# Patient Record
Sex: Female | Born: 1935
Health system: Southern US, Community
[De-identification: ages and names within clinical notes are randomized; demographics above are authoritative.]

## PROBLEM LIST (undated history)

## (undated) DIAGNOSIS — I4891 Unspecified atrial fibrillation: Secondary | ICD-10-CM

## (undated) DIAGNOSIS — Z9889 Other specified postprocedural states: Secondary | ICD-10-CM

## (undated) DIAGNOSIS — Z9189 Other specified personal risk factors, not elsewhere classified: Secondary | ICD-10-CM

## (undated) DIAGNOSIS — N289 Disorder of kidney and ureter, unspecified: Secondary | ICD-10-CM

## (undated) DIAGNOSIS — H01009 Unspecified blepharitis unspecified eye, unspecified eyelid: Secondary | ICD-10-CM

## (undated) DIAGNOSIS — R112 Nausea with vomiting, unspecified: Secondary | ICD-10-CM

## (undated) DIAGNOSIS — K5790 Diverticulosis of intestine, part unspecified, without perforation or abscess without bleeding: Secondary | ICD-10-CM

## (undated) DIAGNOSIS — D649 Anemia, unspecified: Secondary | ICD-10-CM

## (undated) DIAGNOSIS — R3129 Other microscopic hematuria: Secondary | ICD-10-CM

## (undated) DIAGNOSIS — D219 Benign neoplasm of connective and other soft tissue, unspecified: Secondary | ICD-10-CM

## (undated) DIAGNOSIS — K219 Gastro-esophageal reflux disease without esophagitis: Secondary | ICD-10-CM

## (undated) DIAGNOSIS — I2510 Atherosclerotic heart disease of native coronary artery without angina pectoris: Secondary | ICD-10-CM

## (undated) DIAGNOSIS — E7800 Pure hypercholesterolemia, unspecified: Secondary | ICD-10-CM

## (undated) DIAGNOSIS — K649 Unspecified hemorrhoids: Secondary | ICD-10-CM

## (undated) DIAGNOSIS — M8580 Other specified disorders of bone density and structure, unspecified site: Secondary | ICD-10-CM

## (undated) DIAGNOSIS — M47816 Spondylosis without myelopathy or radiculopathy, lumbar region: Secondary | ICD-10-CM

## (undated) DIAGNOSIS — M1990 Unspecified osteoarthritis, unspecified site: Secondary | ICD-10-CM

## (undated) DIAGNOSIS — N809 Endometriosis, unspecified: Secondary | ICD-10-CM

## (undated) HISTORY — DX: Other microscopic hematuria: R31.29

## (undated) HISTORY — DX: Unspecified atrial fibrillation: I48.91

## (undated) HISTORY — DX: Pure hypercholesterolemia, unspecified: E78.00

## (undated) HISTORY — PX: APPENDECTOMY: SHX54

## (undated) HISTORY — PX: CATARACT EXTRACTION W/ INTRAOCULAR LENS  IMPLANT, BILATERAL: SHX1307

## (undated) HISTORY — PX: SALPINGOOPHORECTOMY: SHX82

## (undated) HISTORY — DX: Other specified personal risk factors, not elsewhere classified: Z91.89

## (undated) HISTORY — PX: OVARIAN CYST SURGERY: SHX726

## (undated) HISTORY — DX: Endometriosis, unspecified: N80.9

## (undated) HISTORY — DX: Unspecified blepharitis unspecified eye, unspecified eyelid: H01.009

## (undated) HISTORY — DX: Unspecified hemorrhoids: K64.9

## (undated) HISTORY — DX: Benign neoplasm of connective and other soft tissue, unspecified: D21.9

## (undated) HISTORY — DX: Diverticulosis of intestine, part unspecified, without perforation or abscess without bleeding: K57.90

## (undated) HISTORY — PX: DILATION AND CURETTAGE OF UTERUS: SHX78

## (undated) HISTORY — DX: Disorder of kidney and ureter, unspecified: N28.9

## (undated) HISTORY — PX: TONSILLECTOMY: SUR1361

## (undated) HISTORY — DX: Spondylosis without myelopathy or radiculopathy, lumbar region: M47.816

## (undated) HISTORY — DX: Other specified disorders of bone density and structure, unspecified site: M85.80

## (undated) HISTORY — PX: CORONARY ANGIOPLASTY WITH STENT PLACEMENT: SHX49

---

## 1997-10-21 ENCOUNTER — Other Ambulatory Visit: Admission: RE | Admit: 1997-10-21 | Discharge: 1997-10-21 | Payer: Self-pay | Admitting: Obstetrics and Gynecology

## 1999-05-24 ENCOUNTER — Encounter: Payer: Self-pay | Admitting: Emergency Medicine

## 1999-05-24 ENCOUNTER — Inpatient Hospital Stay (HOSPITAL_COMMUNITY): Admission: EM | Admit: 1999-05-24 | Discharge: 1999-05-25 | Payer: Self-pay | Admitting: Emergency Medicine

## 1999-05-25 ENCOUNTER — Encounter: Payer: Self-pay | Admitting: Geriatric Medicine

## 2000-02-13 ENCOUNTER — Other Ambulatory Visit: Admission: RE | Admit: 2000-02-13 | Discharge: 2000-02-13 | Payer: Self-pay | Admitting: Obstetrics and Gynecology

## 2000-11-29 ENCOUNTER — Encounter: Payer: Self-pay | Admitting: Geriatric Medicine

## 2000-11-29 ENCOUNTER — Encounter: Admission: RE | Admit: 2000-11-29 | Discharge: 2000-11-29 | Payer: Self-pay | Admitting: Geriatric Medicine

## 2001-02-18 ENCOUNTER — Other Ambulatory Visit: Admission: RE | Admit: 2001-02-18 | Discharge: 2001-02-18 | Payer: Self-pay | Admitting: Obstetrics and Gynecology

## 2002-04-21 ENCOUNTER — Other Ambulatory Visit: Admission: RE | Admit: 2002-04-21 | Discharge: 2002-04-21 | Payer: Self-pay | Admitting: Obstetrics and Gynecology

## 2003-05-11 ENCOUNTER — Other Ambulatory Visit: Admission: RE | Admit: 2003-05-11 | Discharge: 2003-05-11 | Payer: Self-pay | Admitting: Obstetrics and Gynecology

## 2011-07-25 DIAGNOSIS — M7710 Lateral epicondylitis, unspecified elbow: Secondary | ICD-10-CM | POA: Diagnosis not present

## 2011-11-12 DIAGNOSIS — H0019 Chalazion unspecified eye, unspecified eyelid: Secondary | ICD-10-CM | POA: Diagnosis not present

## 2011-11-12 DIAGNOSIS — H3530 Unspecified macular degeneration: Secondary | ICD-10-CM | POA: Diagnosis not present

## 2011-11-12 DIAGNOSIS — H259 Unspecified age-related cataract: Secondary | ICD-10-CM | POA: Diagnosis not present

## 2011-11-12 DIAGNOSIS — H01009 Unspecified blepharitis unspecified eye, unspecified eyelid: Secondary | ICD-10-CM | POA: Diagnosis not present

## 2011-11-14 DIAGNOSIS — N952 Postmenopausal atrophic vaginitis: Secondary | ICD-10-CM | POA: Diagnosis not present

## 2012-06-05 DIAGNOSIS — H52209 Unspecified astigmatism, unspecified eye: Secondary | ICD-10-CM | POA: Diagnosis not present

## 2012-06-05 DIAGNOSIS — H259 Unspecified age-related cataract: Secondary | ICD-10-CM | POA: Diagnosis not present

## 2012-06-05 DIAGNOSIS — H0019 Chalazion unspecified eye, unspecified eyelid: Secondary | ICD-10-CM | POA: Diagnosis not present

## 2012-06-05 DIAGNOSIS — H01009 Unspecified blepharitis unspecified eye, unspecified eyelid: Secondary | ICD-10-CM | POA: Diagnosis not present

## 2012-06-18 DIAGNOSIS — Z23 Encounter for immunization: Secondary | ICD-10-CM | POA: Diagnosis not present

## 2012-06-18 DIAGNOSIS — H0019 Chalazion unspecified eye, unspecified eyelid: Secondary | ICD-10-CM | POA: Diagnosis not present

## 2012-09-25 DIAGNOSIS — H16209 Unspecified keratoconjunctivitis, unspecified eye: Secondary | ICD-10-CM | POA: Diagnosis not present

## 2012-11-17 DIAGNOSIS — H16109 Unspecified superficial keratitis, unspecified eye: Secondary | ICD-10-CM | POA: Diagnosis not present

## 2012-11-17 DIAGNOSIS — H04129 Dry eye syndrome of unspecified lacrimal gland: Secondary | ICD-10-CM | POA: Diagnosis not present

## 2012-11-17 DIAGNOSIS — H259 Unspecified age-related cataract: Secondary | ICD-10-CM | POA: Diagnosis not present

## 2012-11-17 DIAGNOSIS — H01009 Unspecified blepharitis unspecified eye, unspecified eyelid: Secondary | ICD-10-CM | POA: Diagnosis not present

## 2012-11-27 DIAGNOSIS — L821 Other seborrheic keratosis: Secondary | ICD-10-CM | POA: Diagnosis not present

## 2012-12-25 DIAGNOSIS — L821 Other seborrheic keratosis: Secondary | ICD-10-CM | POA: Diagnosis not present

## 2013-01-26 DIAGNOSIS — M48061 Spinal stenosis, lumbar region without neurogenic claudication: Secondary | ICD-10-CM | POA: Diagnosis not present

## 2013-01-26 DIAGNOSIS — M5137 Other intervertebral disc degeneration, lumbosacral region: Secondary | ICD-10-CM | POA: Diagnosis not present

## 2013-01-26 DIAGNOSIS — M4310 Spondylolisthesis, site unspecified: Secondary | ICD-10-CM | POA: Diagnosis not present

## 2013-01-26 DIAGNOSIS — M545 Low back pain: Secondary | ICD-10-CM | POA: Diagnosis not present

## 2013-02-19 DIAGNOSIS — L821 Other seborrheic keratosis: Secondary | ICD-10-CM | POA: Diagnosis not present

## 2013-03-17 DIAGNOSIS — Z23 Encounter for immunization: Secondary | ICD-10-CM | POA: Diagnosis not present

## 2013-03-24 DIAGNOSIS — L821 Other seborrheic keratosis: Secondary | ICD-10-CM | POA: Diagnosis not present

## 2013-04-23 DIAGNOSIS — L821 Other seborrheic keratosis: Secondary | ICD-10-CM | POA: Diagnosis not present

## 2013-05-26 DIAGNOSIS — L821 Other seborrheic keratosis: Secondary | ICD-10-CM | POA: Diagnosis not present

## 2013-06-25 DIAGNOSIS — H01009 Unspecified blepharitis unspecified eye, unspecified eyelid: Secondary | ICD-10-CM | POA: Diagnosis not present

## 2013-06-25 DIAGNOSIS — L821 Other seborrheic keratosis: Secondary | ICD-10-CM | POA: Diagnosis not present

## 2013-06-25 DIAGNOSIS — H0019 Chalazion unspecified eye, unspecified eyelid: Secondary | ICD-10-CM | POA: Diagnosis not present

## 2013-11-12 DIAGNOSIS — L821 Other seborrheic keratosis: Secondary | ICD-10-CM | POA: Diagnosis not present

## 2013-12-21 DIAGNOSIS — H01009 Unspecified blepharitis unspecified eye, unspecified eyelid: Secondary | ICD-10-CM | POA: Diagnosis not present

## 2013-12-21 DIAGNOSIS — H04129 Dry eye syndrome of unspecified lacrimal gland: Secondary | ICD-10-CM | POA: Diagnosis not present

## 2013-12-21 DIAGNOSIS — Z961 Presence of intraocular lens: Secondary | ICD-10-CM | POA: Diagnosis not present

## 2013-12-21 DIAGNOSIS — H259 Unspecified age-related cataract: Secondary | ICD-10-CM | POA: Diagnosis not present

## 2013-12-24 DIAGNOSIS — L918 Other hypertrophic disorders of the skin: Secondary | ICD-10-CM | POA: Diagnosis not present

## 2013-12-24 DIAGNOSIS — L908 Other atrophic disorders of skin: Secondary | ICD-10-CM | POA: Diagnosis not present

## 2014-01-07 DIAGNOSIS — R55 Syncope and collapse: Secondary | ICD-10-CM | POA: Diagnosis not present

## 2014-01-07 DIAGNOSIS — R110 Nausea: Secondary | ICD-10-CM | POA: Diagnosis not present

## 2014-01-18 DIAGNOSIS — Z1331 Encounter for screening for depression: Secondary | ICD-10-CM | POA: Diagnosis not present

## 2014-01-18 DIAGNOSIS — E7800 Pure hypercholesterolemia, unspecified: Secondary | ICD-10-CM | POA: Diagnosis not present

## 2014-01-18 DIAGNOSIS — R002 Palpitations: Secondary | ICD-10-CM | POA: Diagnosis not present

## 2014-01-18 DIAGNOSIS — Z0000 Encounter for general adult medical examination without abnormal findings: Secondary | ICD-10-CM | POA: Diagnosis not present

## 2014-01-18 DIAGNOSIS — F411 Generalized anxiety disorder: Secondary | ICD-10-CM | POA: Diagnosis not present

## 2014-01-18 DIAGNOSIS — Z79899 Other long term (current) drug therapy: Secondary | ICD-10-CM | POA: Diagnosis not present

## 2014-01-18 DIAGNOSIS — R42 Dizziness and giddiness: Secondary | ICD-10-CM | POA: Diagnosis not present

## 2014-01-19 ENCOUNTER — Other Ambulatory Visit: Payer: Self-pay | Admitting: Nurse Practitioner

## 2014-01-19 DIAGNOSIS — R002 Palpitations: Secondary | ICD-10-CM

## 2014-01-21 ENCOUNTER — Encounter (INDEPENDENT_AMBULATORY_CARE_PROVIDER_SITE_OTHER): Payer: Medicare Other

## 2014-01-21 ENCOUNTER — Encounter: Payer: Self-pay | Admitting: *Deleted

## 2014-01-21 DIAGNOSIS — R002 Palpitations: Secondary | ICD-10-CM

## 2014-01-21 NOTE — Progress Notes (Signed)
Patient ID: Sherry Barnett, female   DOB: October 09, 1932, 80 y.o.   MRN: [id number] E-Cardio 24 hour holter monitor applied to patient.

## 2014-01-26 DIAGNOSIS — L821 Other seborrheic keratosis: Secondary | ICD-10-CM | POA: Diagnosis not present

## 2014-02-10 ENCOUNTER — Telehealth: Payer: Self-pay

## 2014-02-10 NOTE — Telephone Encounter (Signed)
pt aware of holter results -NSR -OCC PAC and brief SV Runs -Overall Benign pt verbalized understanding.

## 2014-02-17 DIAGNOSIS — H10509 Unspecified blepharoconjunctivitis, unspecified eye: Secondary | ICD-10-CM | POA: Diagnosis not present

## 2014-03-01 DIAGNOSIS — H01009 Unspecified blepharitis unspecified eye, unspecified eyelid: Secondary | ICD-10-CM | POA: Diagnosis not present

## 2014-03-01 DIAGNOSIS — H16209 Unspecified keratoconjunctivitis, unspecified eye: Secondary | ICD-10-CM | POA: Diagnosis not present

## 2014-03-01 DIAGNOSIS — T887XXA Unspecified adverse effect of drug or medicament, initial encounter: Secondary | ICD-10-CM | POA: Diagnosis not present

## 2014-03-01 DIAGNOSIS — H259 Unspecified age-related cataract: Secondary | ICD-10-CM | POA: Diagnosis not present

## 2014-03-19 DIAGNOSIS — I788 Other diseases of capillaries: Secondary | ICD-10-CM | POA: Diagnosis not present

## 2014-03-25 DIAGNOSIS — L918 Other hypertrophic disorders of the skin: Secondary | ICD-10-CM | POA: Diagnosis not present

## 2014-03-25 DIAGNOSIS — L821 Other seborrheic keratosis: Secondary | ICD-10-CM | POA: Diagnosis not present

## 2014-04-19 DIAGNOSIS — Z79899 Other long term (current) drug therapy: Secondary | ICD-10-CM | POA: Diagnosis not present

## 2014-04-19 DIAGNOSIS — E780 Pure hypercholesterolemia: Secondary | ICD-10-CM | POA: Diagnosis not present

## 2014-06-18 DIAGNOSIS — H01004 Unspecified blepharitis left upper eyelid: Secondary | ICD-10-CM | POA: Diagnosis not present

## 2014-06-18 DIAGNOSIS — H2511 Age-related nuclear cataract, right eye: Secondary | ICD-10-CM | POA: Diagnosis not present

## 2014-06-18 DIAGNOSIS — H01001 Unspecified blepharitis right upper eyelid: Secondary | ICD-10-CM | POA: Diagnosis not present

## 2014-06-18 DIAGNOSIS — H04123 Dry eye syndrome of bilateral lacrimal glands: Secondary | ICD-10-CM | POA: Diagnosis not present

## 2014-10-01 DIAGNOSIS — H01001 Unspecified blepharitis right upper eyelid: Secondary | ICD-10-CM | POA: Diagnosis not present

## 2014-10-01 DIAGNOSIS — H0011 Chalazion right upper eyelid: Secondary | ICD-10-CM | POA: Diagnosis not present

## 2014-10-01 DIAGNOSIS — H01004 Unspecified blepharitis left upper eyelid: Secondary | ICD-10-CM | POA: Diagnosis not present

## 2014-10-26 DIAGNOSIS — L821 Other seborrheic keratosis: Secondary | ICD-10-CM | POA: Diagnosis not present

## 2014-10-26 DIAGNOSIS — D485 Neoplasm of uncertain behavior of skin: Secondary | ICD-10-CM | POA: Diagnosis not present

## 2014-10-26 DIAGNOSIS — D224 Melanocytic nevi of scalp and neck: Secondary | ICD-10-CM | POA: Diagnosis not present

## 2014-10-28 DIAGNOSIS — D2239 Melanocytic nevi of other parts of face: Secondary | ICD-10-CM | POA: Diagnosis not present

## 2014-10-28 DIAGNOSIS — D224 Melanocytic nevi of scalp and neck: Secondary | ICD-10-CM | POA: Diagnosis not present

## 2014-10-28 DIAGNOSIS — D485 Neoplasm of uncertain behavior of skin: Secondary | ICD-10-CM | POA: Diagnosis not present

## 2014-11-01 DIAGNOSIS — M5136 Other intervertebral disc degeneration, lumbar region: Secondary | ICD-10-CM | POA: Diagnosis not present

## 2014-11-04 DIAGNOSIS — L821 Other seborrheic keratosis: Secondary | ICD-10-CM | POA: Diagnosis not present

## 2014-11-18 DIAGNOSIS — L821 Other seborrheic keratosis: Secondary | ICD-10-CM | POA: Diagnosis not present

## 2014-12-21 DIAGNOSIS — L821 Other seborrheic keratosis: Secondary | ICD-10-CM | POA: Diagnosis not present

## 2014-12-27 DIAGNOSIS — H02054 Trichiasis without entropian left upper eyelid: Secondary | ICD-10-CM | POA: Diagnosis not present

## 2014-12-27 DIAGNOSIS — H01004 Unspecified blepharitis left upper eyelid: Secondary | ICD-10-CM | POA: Diagnosis not present

## 2014-12-27 DIAGNOSIS — H02051 Trichiasis without entropian right upper eyelid: Secondary | ICD-10-CM | POA: Diagnosis not present

## 2014-12-27 DIAGNOSIS — H25811 Combined forms of age-related cataract, right eye: Secondary | ICD-10-CM | POA: Diagnosis not present

## 2015-01-24 DIAGNOSIS — Z1389 Encounter for screening for other disorder: Secondary | ICD-10-CM | POA: Diagnosis not present

## 2015-01-24 DIAGNOSIS — Z79899 Other long term (current) drug therapy: Secondary | ICD-10-CM | POA: Diagnosis not present

## 2015-01-24 DIAGNOSIS — E780 Pure hypercholesterolemia: Secondary | ICD-10-CM | POA: Diagnosis not present

## 2015-01-24 DIAGNOSIS — Z0000 Encounter for general adult medical examination without abnormal findings: Secondary | ICD-10-CM | POA: Diagnosis not present

## 2015-01-25 DIAGNOSIS — L821 Other seborrheic keratosis: Secondary | ICD-10-CM | POA: Diagnosis not present

## 2015-06-20 DIAGNOSIS — Z0100 Encounter for examination of eyes and vision without abnormal findings: Secondary | ICD-10-CM | POA: Diagnosis not present

## 2015-06-20 DIAGNOSIS — H25811 Combined forms of age-related cataract, right eye: Secondary | ICD-10-CM | POA: Diagnosis not present

## 2015-06-20 DIAGNOSIS — H353111 Nonexudative age-related macular degeneration, right eye, early dry stage: Secondary | ICD-10-CM | POA: Diagnosis not present

## 2015-06-20 DIAGNOSIS — H01004 Unspecified blepharitis left upper eyelid: Secondary | ICD-10-CM | POA: Diagnosis not present

## 2015-09-30 DIAGNOSIS — I8391 Asymptomatic varicose veins of right lower extremity: Secondary | ICD-10-CM | POA: Diagnosis not present

## 2015-09-30 DIAGNOSIS — I788 Other diseases of capillaries: Secondary | ICD-10-CM | POA: Diagnosis not present

## 2015-09-30 DIAGNOSIS — D692 Other nonthrombocytopenic purpura: Secondary | ICD-10-CM | POA: Diagnosis not present

## 2015-09-30 DIAGNOSIS — I8392 Asymptomatic varicose veins of left lower extremity: Secondary | ICD-10-CM | POA: Diagnosis not present

## 2016-01-12 DIAGNOSIS — H02054 Trichiasis without entropian left upper eyelid: Secondary | ICD-10-CM | POA: Diagnosis not present

## 2016-01-12 DIAGNOSIS — H02051 Trichiasis without entropian right upper eyelid: Secondary | ICD-10-CM | POA: Diagnosis not present

## 2016-01-12 DIAGNOSIS — H25811 Combined forms of age-related cataract, right eye: Secondary | ICD-10-CM | POA: Diagnosis not present

## 2016-01-12 DIAGNOSIS — H01001 Unspecified blepharitis right upper eyelid: Secondary | ICD-10-CM | POA: Diagnosis not present

## 2016-01-30 DIAGNOSIS — Z79899 Other long term (current) drug therapy: Secondary | ICD-10-CM | POA: Diagnosis not present

## 2016-01-30 DIAGNOSIS — E7800 Pure hypercholesterolemia, unspecified: Secondary | ICD-10-CM | POA: Diagnosis not present

## 2016-01-30 DIAGNOSIS — Z1389 Encounter for screening for other disorder: Secondary | ICD-10-CM | POA: Diagnosis not present

## 2016-01-30 DIAGNOSIS — Z780 Asymptomatic menopausal state: Secondary | ICD-10-CM | POA: Diagnosis not present

## 2016-01-30 DIAGNOSIS — H6121 Impacted cerumen, right ear: Secondary | ICD-10-CM | POA: Diagnosis not present

## 2016-01-30 DIAGNOSIS — Z0000 Encounter for general adult medical examination without abnormal findings: Secondary | ICD-10-CM | POA: Diagnosis not present

## 2016-03-07 DIAGNOSIS — Z780 Asymptomatic menopausal state: Secondary | ICD-10-CM | POA: Diagnosis not present

## 2016-03-07 DIAGNOSIS — M8588 Other specified disorders of bone density and structure, other site: Secondary | ICD-10-CM | POA: Diagnosis not present

## 2016-03-11 DIAGNOSIS — M8580 Other specified disorders of bone density and structure, unspecified site: Secondary | ICD-10-CM

## 2016-03-11 HISTORY — DX: Other specified disorders of bone density and structure, unspecified site: M85.80

## 2016-04-05 DIAGNOSIS — L821 Other seborrheic keratosis: Secondary | ICD-10-CM | POA: Diagnosis not present

## 2016-04-06 DIAGNOSIS — M85861 Other specified disorders of bone density and structure, right lower leg: Secondary | ICD-10-CM | POA: Diagnosis not present

## 2016-04-19 DIAGNOSIS — L821 Other seborrheic keratosis: Secondary | ICD-10-CM | POA: Diagnosis not present

## 2016-05-10 DIAGNOSIS — L821 Other seborrheic keratosis: Secondary | ICD-10-CM | POA: Diagnosis not present

## 2016-06-26 DIAGNOSIS — J019 Acute sinusitis, unspecified: Secondary | ICD-10-CM | POA: Diagnosis not present

## 2016-06-26 DIAGNOSIS — B9789 Other viral agents as the cause of diseases classified elsewhere: Secondary | ICD-10-CM | POA: Diagnosis not present

## 2016-07-06 DIAGNOSIS — J019 Acute sinusitis, unspecified: Secondary | ICD-10-CM | POA: Diagnosis not present

## 2016-07-06 DIAGNOSIS — R05 Cough: Secondary | ICD-10-CM | POA: Diagnosis not present

## 2016-07-16 DIAGNOSIS — H25811 Combined forms of age-related cataract, right eye: Secondary | ICD-10-CM | POA: Diagnosis not present

## 2016-07-16 DIAGNOSIS — H52201 Unspecified astigmatism, right eye: Secondary | ICD-10-CM | POA: Diagnosis not present

## 2016-07-16 DIAGNOSIS — Z961 Presence of intraocular lens: Secondary | ICD-10-CM | POA: Diagnosis not present

## 2016-07-16 DIAGNOSIS — H01001 Unspecified blepharitis right upper eyelid: Secondary | ICD-10-CM | POA: Diagnosis not present

## 2016-10-22 DIAGNOSIS — H353111 Nonexudative age-related macular degeneration, right eye, early dry stage: Secondary | ICD-10-CM | POA: Diagnosis not present

## 2016-10-22 DIAGNOSIS — S0011XA Contusion of right eyelid and periocular area, initial encounter: Secondary | ICD-10-CM | POA: Diagnosis not present

## 2016-10-22 DIAGNOSIS — H25811 Combined forms of age-related cataract, right eye: Secondary | ICD-10-CM | POA: Diagnosis not present

## 2016-10-22 DIAGNOSIS — H52201 Unspecified astigmatism, right eye: Secondary | ICD-10-CM | POA: Diagnosis not present

## 2016-11-06 DIAGNOSIS — H25811 Combined forms of age-related cataract, right eye: Secondary | ICD-10-CM | POA: Diagnosis not present

## 2017-02-04 DIAGNOSIS — Z1389 Encounter for screening for other disorder: Secondary | ICD-10-CM | POA: Diagnosis not present

## 2017-02-04 DIAGNOSIS — I480 Paroxysmal atrial fibrillation: Secondary | ICD-10-CM | POA: Diagnosis not present

## 2017-02-04 DIAGNOSIS — Z79899 Other long term (current) drug therapy: Secondary | ICD-10-CM | POA: Diagnosis not present

## 2017-02-04 DIAGNOSIS — R0789 Other chest pain: Secondary | ICD-10-CM | POA: Diagnosis not present

## 2017-02-04 DIAGNOSIS — E7800 Pure hypercholesterolemia, unspecified: Secondary | ICD-10-CM | POA: Diagnosis not present

## 2017-02-04 DIAGNOSIS — Z23 Encounter for immunization: Secondary | ICD-10-CM | POA: Diagnosis not present

## 2017-02-04 DIAGNOSIS — Z0000 Encounter for general adult medical examination without abnormal findings: Secondary | ICD-10-CM | POA: Diagnosis not present

## 2017-02-04 DIAGNOSIS — M8580 Other specified disorders of bone density and structure, unspecified site: Secondary | ICD-10-CM | POA: Diagnosis not present

## 2017-02-26 ENCOUNTER — Encounter (INDEPENDENT_AMBULATORY_CARE_PROVIDER_SITE_OTHER): Payer: Self-pay

## 2017-02-26 ENCOUNTER — Encounter (HOSPITAL_COMMUNITY): Payer: Medicare Other

## 2017-02-26 ENCOUNTER — Ambulatory Visit (INDEPENDENT_AMBULATORY_CARE_PROVIDER_SITE_OTHER): Payer: Medicare Other | Admitting: Interventional Cardiology

## 2017-02-26 ENCOUNTER — Encounter: Payer: Self-pay | Admitting: Interventional Cardiology

## 2017-02-26 VITALS — BP 128/70 | HR 73 | Ht 63.0 in | Wt 140.6 lb

## 2017-02-26 DIAGNOSIS — I480 Paroxysmal atrial fibrillation: Secondary | ICD-10-CM

## 2017-02-26 DIAGNOSIS — E785 Hyperlipidemia, unspecified: Secondary | ICD-10-CM

## 2017-02-26 DIAGNOSIS — I208 Other forms of angina pectoris: Secondary | ICD-10-CM | POA: Diagnosis not present

## 2017-02-26 MED ORDER — NITROGLYCERIN 0.4 MG SL SUBL: 0.4000 mg | SUBLINGUAL_TABLET | SUBLINGUAL | 3 refills | Status: DC | PRN

## 2017-02-26 MED ORDER — ASPIRIN EC 81 MG PO TBEC: 81.0000 mg | DELAYED_RELEASE_TABLET | Freq: Every day | ORAL | 3 refills | Status: DC

## 2017-02-26 MED ORDER — METOPROLOL SUCCINATE ER 25 MG PO TB24: 25.0000 mg | ORAL_TABLET | Freq: Every day | ORAL | 3 refills | Status: DC

## 2017-02-26 NOTE — Progress Notes (Signed)
Cardiology Office Note    Date:  02/26/2017   ID:  Tavionna, Grout Jan 15, 1933, MRN [id number]  PCP:  Lajean Manes, MD  Cardiologist: Sinclair Grooms, MD   Chief Complaint  Patient presents with  . Coronary Artery Disease    New onset angina    History of Present Illness:  Milinda E Gellner is a 83 y.o. female is referred by Dr. Lajean Manes for evaluation of exertional chest and jaw discomfort. She has a background history of paroxysmal/episodic atrial fibrillation but no documenting data available. Not on anticoagulation therapy.  Over the past 6-8 most 8 weeks the patient has noticed recurring precordial chest pressure with radiation into the jaws bilaterally with moderate physical activity. She has had at least 4 instances of this complaint. The initial episode occurred while exercising on the elliptical. This is something that she does regularly. She stopped exercising and the discomfort went away. She tried this again a week or so later and the same thing happened. Subsequently she has noticed the symptoms develop with yard work on one occasion and more recently she carried a bag of trash to her trash can on the curb and had the same discomfort to recur. If she stops and rests, the discomfort improves. She has had no instances of discomfort to occur spontaneously. The discomfort resolves within 60-90 seconds. There is no associated dyspnea or diaphoresis.  Past Medical History:  Diagnosis Date  . A-fib ([place])   . Bilateral cataracts   . Blepharitis    chronic  . Cardiovascular risk factor    23%  . Degenerative joint disease (DJD) of lumbar spine   . Diverticulosis   . Endometriosis   . Fibroids   . Hematuria, microscopic F120055  . Hemorrhoids   . Hypercholesterolemia    10 years  . Osteopenia 03/2016   start alendrodate  . Renal disease    stage2/stage 3    Past Surgical History:  Procedure Laterality Date  . APPENDECTOMY    . CATARACT EXTRACTION, BILATERAL  Bilateral 2001 & 11/2016  . ovarian cysts Bilateral   . tubes removal Bilateral    endometriosis    Current Medications: Outpatient Medications Prior to Visit  Medication Sig Dispense Refill  . alendronate (FOSAMAX) 70 MG tablet Take 70 mg by mouth once a week. Take with a full glass of water on an empty stomach.    Marland Kitchen atorvastatin (LIPITOR) 10 MG tablet Take 10 mg by mouth once a week.    . Azelaic Acid (FINACEA) 15 % cream Apply topically 2 (two) times daily. After skin is thoroughly washed and patted dry, gently but thoroughly massage a thin film of azelaic acid cream into the affected area twice daily, in the morning and evening.    . cholecalciferol (VITAMIN D) 1000 units tablet Take 1,000 Units by mouth daily.    Marland Kitchen tobramycin-dexamethasone (TOBRADEX) ophthalmic ointment 1 application daily.    Marland Kitchen aspirin 325 MG EC tablet Take 325 mg by mouth daily.    . minocycline (MINOCIN,DYNACIN) 100 MG capsule Take 100 mg by mouth as needed.     No facility-administered medications prior to visit.      Allergies:   Sulfa antibiotics; Novocain [procaine]; and Penicillins   Social History   Social History  . Marital status: Single    Spouse name: N/A  . Number of children: N/A  . Years of education: N/A   Social History Main Topics  . Smoking status: Former Smoker  Years: 5.00    Types: Cigarettes    Quit date: 02/06/1969  . Smokeless tobacco: Never Used  . Alcohol use No  . Drug use: No  . Sexual activity: Not Asked     Comment: MARRIED   Other Topics Concern  . None   Social History Narrative  . None     Family History:  The patient's family history includes Alzheimer's disease in her father; Heart attack in her mother; Hypertension in her father; Other (age of onset: 71) in her sister; Stroke in her father.   ROS:   Please see the history of present illness.    She states there has been a prior diagnosis of atrial fibrillation but can't remember the details. She has a  cough, occasional palpitations, and easy bruising.  All other systems reviewed and are negative.   PHYSICAL EXAM:   VS:  BP 128/70 (BP Location: Left Arm)   Pulse 73   Ht 5\' 3"  (1.6 m)   Wt 140 lb 9.6 oz (63.8 kg)   BMI 24.91 kg/m    GEN: Well nourished, well developed, in no acute distress  HEENT: normal  Neck: no JVD, carotid bruits, or masses Cardiac: RRR; no murmurs, rubs, or gallops,no edema  Respiratory:  clear to auscultation bilaterally, normal work of breathing GI: soft, nontender, nondistended, + BS MS: no deformity or atrophy  Skin: warm and dry, no rash Neuro:  Alert and Oriented x 3, Strength and sensation are intact Psych: euthymic mood, full affect  Wt Readings from Last 3 Encounters:  02/26/17 140 lb 9.6 oz (63.8 kg)      Studies/Labs Reviewed:   EKG:  EKG  Normal sinus rhythm and normal tracing. No old tracing to compare.  Recent Labs: No results found for requested labs within last 8760 hours.   Lipid Panel No results found for: CHOL, TRIG, HDL, CHOLHDL, VLDL, LDLCALC, LDLDIRECT  Additional studies/ records that were reviewed today include:  No prior cardiac studies. According to Dr. Carlyle Lipa records, the diagnosis of atrial fibrillation has been made but I have no record of this.  CHA2DS2/VAS Stroke Risk Points  3  >= 2 Points: High Risk  1 - 1.99 Points: Medium Risk  0 Points: Low Risk    The patient's score has not changed in the past year.:  No Change         Details    Note: External data might be a factor in metrics not marked with    Points Metrics   This score determines the patient's risk of having a stroke if the  patient has atrial fibrillation.       0 Has Congestive Heart Failure:  No   0 Has Vascular Disease:  No   0 Has Hypertension:  No   2 Age:  66   0 Has Diabetes:  No   0 Had Stroke:  No Had TIA:  No Had thromboembolism:  No   1 Female:  Yes         ASSESSMENT:    1. Other forms of angina pectoris ([place])   2.  PAF (paroxysmal atrial fibrillation) ([place])   3. Hyperlipidemia LDL goal <70    CHA2DS2/VAS Stroke Risk Points = 3     3 - >= 2 Points: High Risk  1 - 1.99 Points: Medium Risk  0 - Points: Low Risk          PLAN:  In order of problems listed above:  1. This  is a new complaint. Aspirin 81 mg daily, continue statin and will probably need to intensify dose, metoprolol succinate 25 mg per day, and sublingual nitroglycerin are prescribed. She has cautioned to call if pain at rest, prolonged pain unrelieved by rest, or more easily provokable discomfort. A stress myocardial perfusion study will be done as soon as possible. If moderate to high risk will need coronary angiography. Angiography was offered at this time but she prefers a less aggressive approach. 2. I have no documentation of atrial fibrillation from prior monitoring or on EKG. She needs a 30 day continuous ambulatory monitor. At her age, if atrial fibrillation is occurring in the background, she needs full anticoagulation. 3. LDL target should be less than 70.  Stress myocardial perfusion study as soon as possible. Follow-up in 7 to it most 10 days. If moderate to high risk study and/or no improvement in symptoms on medical therapy, she will need coronary angiography. She is implored to report to the emergency room if pain at rest, pain requiring nitroglycerin to relieve, or prolonged discomfort.  Medication Adjustments/Labs and Tests Ordered: Current medicines are reviewed at length with the patient today.  Concerns regarding medicines are outlined above.  Medication changes, Labs and Tests ordered today are listed in the Patient Instructions below. Patient Instructions  Medication Instructions:  1) START Metoprolol Succinate 25mg  once daily 2) DECREASE Aspirin to 81mg  once daily 3) You may take Nitro when you are having chest pain.  The proper use and anticipated side effects of nitroglycerine has been carefully explained.  If a  single episode of chest pain is not relieved by one tablet, the patient will try another within 5 minutes; and if this doesn't relieve the pain, the patient is instructed to call 911 for transportation to an emergency department.   Labwork: None  Testing/Procedures: Your physician has requested that you have a lexiscan myoview ASAP. For further information please visit HugeFiesta.tn. Please follow instruction sheet, as given.  Your physician has recommended that you wear an event monitor. Event monitors are medical devices that record the heart's electrical activity. Doctors most often [place] these monitors to diagnose arrhythmias. Arrhythmias are problems with the speed or rhythm of the heartbeat. The monitor is a small, portable device. You can wear one while you do your normal daily activities. This is usually used to diagnose what is causing palpitations/syncope (passing out).   Follow-Up: Your physician recommends that you schedule a follow-up appointment in: 7-10 days with a PA or NP.    Any Other Special Instructions Will Be Listed Below (If Applicable).     If you need a refill on your cardiac medications before your next appointment, please call your pharmacy.      Signed, Sinclair Grooms, MD  02/26/2017 2:00 PM    [address], [place], [place]  [address] Phone: [phone]; Fax: [phone]

## 2017-02-26 NOTE — Patient Instructions (Signed)
Medication Instructions:  1) START Metoprolol Succinate 25mg  once daily 2) DECREASE Aspirin to 81mg  once daily 3) You may take Nitro when you are having chest pain.  The proper use and anticipated side effects of nitroglycerine has been carefully explained.  If a single episode of chest pain is not relieved by one tablet, the patient will try another within 5 minutes; and if this doesn't relieve the pain, the patient is instructed to call 911 for transportation to an emergency department.   Labwork: None  Testing/Procedures: Your physician has requested that you have a lexiscan myoview ASAP. For further information please visit HugeFiesta.tn. Please follow instruction sheet, as given.  Your physician has recommended that you wear an event monitor. Event monitors are medical devices that record the heart's electrical activity. Doctors most often [place] these monitors to diagnose arrhythmias. Arrhythmias are problems with the speed or rhythm of the heartbeat. The monitor is a small, portable device. You can wear one while you do your normal daily activities. This is usually used to diagnose what is causing palpitations/syncope (passing out).   Follow-Up: Your physician recommends that you schedule a follow-up appointment in: 7-10 days with a PA or NP.    Any Other Special Instructions Will Be Listed Below (If Applicable).     If you need a refill on your cardiac medications before your next appointment, please call your pharmacy.

## 2017-02-27 ENCOUNTER — Ambulatory Visit (HOSPITAL_COMMUNITY): Payer: Medicare Other | Attending: Cardiovascular Disease

## 2017-02-27 DIAGNOSIS — Z8249 Family history of ischemic heart disease and other diseases of the circulatory system: Secondary | ICD-10-CM | POA: Insufficient documentation

## 2017-02-27 DIAGNOSIS — I4891 Unspecified atrial fibrillation: Secondary | ICD-10-CM | POA: Insufficient documentation

## 2017-02-27 DIAGNOSIS — I208 Other forms of angina pectoris: Secondary | ICD-10-CM

## 2017-02-27 LAB — MYOCARDIAL PERFUSION IMAGING
CHL CUP NUCLEAR SDS: 3
CHL CUP NUCLEAR SSS: 3
LHR: 0.29
LV sys vol: 18 mL
LVDIAVOL: 57 mL (ref 46–106)
NUC STRESS TID: 0.85
Peak HR: 96 {beats}/min
Rest HR: 62 {beats}/min
SRS: 0

## 2017-02-27 MED ORDER — TECHNETIUM TC 99M TETROFOSMIN IV KIT
10.3000 | PACK | Freq: Once | INTRAVENOUS | Status: AC | PRN
  Administered 2017-02-27: 10.3 via INTRAVENOUS
  Filled 2017-02-27: qty 11

## 2017-02-27 MED ORDER — REGADENOSON 0.4 MG/5ML IV SOLN
0.4000 mg | Freq: Once | INTRAVENOUS | Status: AC
  Administered 2017-02-27: 0.4 mg via INTRAVENOUS

## 2017-02-27 MED ORDER — TECHNETIUM TC 99M TETROFOSMIN IV KIT
31.5000 | PACK | Freq: Once | INTRAVENOUS | Status: AC | PRN
  Administered 2017-02-27: 31.5 via INTRAVENOUS
  Filled 2017-02-27: qty 32

## 2017-02-28 ENCOUNTER — Telehealth: Payer: Self-pay | Admitting: Interventional Cardiology

## 2017-02-28 NOTE — Telephone Encounter (Signed)
Went over Sherry Barnett results with pt.  Pt verbalized understanding and was in agreement with this plan.

## 2017-02-28 NOTE — Telephone Encounter (Signed)
New message   Pt is returning call to [place]. Please call.

## 2017-03-08 ENCOUNTER — Ambulatory Visit (INDEPENDENT_AMBULATORY_CARE_PROVIDER_SITE_OTHER): Payer: Medicare Other

## 2017-03-08 DIAGNOSIS — I480 Paroxysmal atrial fibrillation: Secondary | ICD-10-CM

## 2017-03-11 NOTE — Progress Notes (Signed)
Cardiology Office Note    Date:  03/13/2017   ID:  Sherry Barnett [birth year], MRN [id number]  PCP:  Lajean Manes, MD  Cardiologist:  Dr. Tamala Julian   Chief Complaint: 2 weeks follow up for angina  History of Present Illness:   Sherry Barnett is a 83 y.o. female with a history of paroxysmal atrial fibrillation remotely, chronic kidney disease stage II-III, hypercholesterolemia presents for follow-up.  Seen by Dr. Tamala Julian 02/26/17 to established care for new onset angina x 8 weeks. Pending 30 day monitor for evaluation of afib. Follow-up stress test low risk however Baseline EKG showed NSR with < 0.29mm ST elevation in V2. Recommended continue medical therapy. If no improvement or worsening of of symptoms, plan for coronary angiography.  Patient is here for follow-up. He continues to have exertional chest pain with shortness of breath. Relieves with rest. He did not require any nitroglycerin. Last Saturday she was walking uphill and needed to stop multiple times. This morning she also had an episode while picking up flowers that resolved with rest. Also complains of intermittent palpitation. This morning she called device company during palpitation however no event noted per patient. She hasn't started Lipitor yet. She has limited her activity due to exertional angina.    Past Medical History:  Diagnosis Date  . A-fib ([place])   . Bilateral cataracts   . Blepharitis    chronic  . Cardiovascular risk factor    23%  . Degenerative joint disease (DJD) of lumbar spine   . Diverticulosis   . Endometriosis   . Fibroids   . Hematuria, microscopic F120055  . Hemorrhoids   . Hypercholesterolemia    10 years  . Osteopenia 03/2016   start alendrodate  . Renal disease    stage2/stage 3    Past Surgical History:  Procedure Laterality Date  . APPENDECTOMY    . CATARACT EXTRACTION, BILATERAL Bilateral 2001 & 11/2016  . ovarian cysts Bilateral   . tubes removal Bilateral    endometriosis     Current Medications: Prior to Admission medications   Medication Sig Start Date End Date Taking? Authorizing Provider  alendronate (FOSAMAX) 70 MG tablet Take 70 mg by mouth once a week. Take with a full glass of water on an empty stomach.    [provider]  aspirin EC 81 MG tablet Take 1 tablet (81 mg total) by mouth daily. 02/26/17   Belva Crome, MD  atorvastatin (LIPITOR) 10 MG tablet Take 10 mg by mouth once a week.    [provider]  Azelaic Acid (FINACEA) 15 % cream Apply topically 2 (two) times daily. After skin is thoroughly washed and patted dry, gently but thoroughly massage a thin film of azelaic acid cream into the affected area twice daily, in the morning and evening.    [provider]  cholecalciferol (VITAMIN D) 1000 units tablet Take 1,000 Units by mouth daily.    [provider]  metoprolol succinate (TOPROL XL) 25 MG 24 hr tablet Take 1 tablet (25 mg total) by mouth daily. 02/26/17   Belva Crome, MD  nitroGLYCERIN (NITROSTAT) 0.4 MG SL tablet Place 1 tablet (0.4 mg total) under the tongue every 5 (five) minutes as needed for chest pain. 02/26/17 05/27/17  Belva Crome, MD  tobramycin-dexamethasone [facility]) ophthalmic ointment 1 application daily.    [provider]    Allergies:   Sulfa antibiotics; Novocain [procaine]; and Penicillins   Social History   Social  History  . Marital status: Single    Spouse name: N/A  . Number of children: N/A  . Years of education: N/A   Social History Main Topics  . Smoking status: Former Smoker    Years: 5.00    Types: Cigarettes    Quit date: 02/06/1969  . Smokeless tobacco: Never Used  . Alcohol use No  . Drug use: No  . Sexual activity: Not Asked     Comment: MARRIED   Other Topics Concern  . None   Social History Narrative  . None     Family History:  The patient's family history includes Alzheimer's disease in her father; Heart attack in her mother;  Hypertension in her father; Other (age of onset: 75) in her sister; Stroke in her father.   ROS:   Please see the history of present illness.    ROS All other systems reviewed and are negative.   PHYSICAL EXAM:   VS:  BP 124/70   Pulse 62   Ht 5\' 3"  (1.6 m)   Wt 139 lb (63 kg)   SpO2 98%   BMI 24.62 kg/m    GEN: Well nourished, well developed, in no acute distress  HEENT: normal  Neck: no JVD, carotid bruits, or masses Cardiac: RRR; no murmurs, rubs, or gallops,no edema  Respiratory:  clear to auscultation bilaterally, normal work of breathing GI: soft, nontender, nondistended, + BS MS: no deformity or atrophy  Skin: warm and dry, no rash Neuro:  Alert and Oriented x 3, Strength and sensation are intact Psych: euthymic mood, full affect  Wt Readings from Last 3 Encounters:  03/13/17 139 lb (63 kg)  02/27/17 140 lb (63.5 kg)  02/26/17 140 lb 9.6 oz (63.8 kg)      Studies/Labs Reviewed:   EKG:  EKG is not ordered today.    Recent Labs: No results found for requested labs within last 8760 hours.   Lipid Panel No results found for: CHOL, TRIG, HDL, CHOLHDL, VLDL, LDLCALC, LDLDIRECT  Additional studies/ records that were reviewed today include:   Stress test 02/27/17  Nuclear stress EF: 68%.  Baseline EKG showed NSR with < 0.35mm ST elevation in V2  There was no ST segment deviation noted during stress from baseline EKG.  The study is normal.  This is a low risk study.  The left ventricular ejection fraction is normal (55-65%).     ASSESSMENT & PLAN:    1. Stable angina pectoris - Low risk stress test however she continues to have exertional angina. Relives with Rest. This is limiting her activity. Take PRN nitro. Reviewed with Dr. Tamala Julian. Plan for cath tomorrow.  Continue ASA, statin and BB. -The patient understands that risks include but are not limited to stroke (1 in 1000), death (1 in 82), kidney failure [usually temporary] (1 in 500), bleeding (1 in  200), allergic reaction [possibly serious] (1 in 200), and agrees to proceed.   2. Remote hx of afib - Pending 30 days monitor. Intermittent palpitations.   3. HLD - Start statin.  Medication Adjustments/Labs and Tests Ordered: Current medicines are reviewed at length with the patient today.  Concerns regarding medicines are outlined above.  Medication changes, Labs and Tests ordered today are listed in the Patient Instructions below. Patient Instructions  Medication Instructions: Your physician recommends that you continue on your current medications as directed. Please refer to the Current Medication list given to you today.  Labwork: Your physician recommends that you have lab work  today: BMET, CBC, PT/INR  Procedures/Testing: Your physician has requested that you have a cardiac catheterization. Cardiac catheterization is used to diagnose and/or treat various heart conditions. Doctors may recommend this procedure for a number of different reasons. The most common reason is to evaluate chest pain. Chest pain can be a symptom of coronary artery disease (CAD), and cardiac catheterization can show whether plaque is narrowing or blocking your heart's arteries. This procedure is also used to evaluate the valves, as well as measure the blood flow and oxygen levels in different parts of your heart. For further information please visit HugeFiesta.tn. Please follow instruction sheet, as given.  Follow-Up: Your physician recommends that you schedule a follow-up appointment in: 2 -3 WEEKS with Dr. Tamala Julian or Robbie Lis, PA-C  Any Additional Special Instructions Will Be Listed Below (If Applicable).    [place] OFFICE [address], [address] [place] [address] Dept: [phone] Loc: [phone]  CHIYOKO TORRICO  03/13/2017  You are scheduled for a Cardiac Catheterization on Thursday, October 4 with Dr.  Daneen Schick.  1. Please arrive at the [facility] (Main Entrance A) at [facility]: [address] [place], [place] [address] at 1:00 PM (two hours before your procedure to ensure your preparation). Free valet parking service is available.   Special note: Every effort is made to have your procedure done on time. Please understand that emergencies sometimes delay scheduled procedures.  2. Diet: You may have a light breakfast in the morning NOTHING to eat after 7 am.  3. Labs: You will need to have blood drawn on Wednesday, October 3 at [facility] at [facility]. [address]  Open: 7:30am - 5pm    Phone: [phone]. You do not need to be fasting.  4. You may take all your morning medications with enough water to get them down safely. On the morning of your procedure, take your Aspirin and any morning medicines NOT listed above.  You may use sips of water.  5. Plan for one night stay--bring personal belongings. 6. Bring a current list of your medications and current insurance cards. 7. You MUST have a responsible person to drive you home. 8. Someone MUST be with you the first 24 hours after you arrive home or your discharge will be delayed. 9. Please wear clothes that are easy to get on and off and wear slip-on shoes.  Thank you for allowing [place] to care for you!   -- [facility]   If you need a refill on your cardiac medications before your next appointment, please call your pharmacy.      Jarrett Soho, [place]  03/13/2017 10:53 AM    [facility] [place] [address], [place], [place]  [address] Phone: [phone]; Fax: [phone]

## 2017-03-13 ENCOUNTER — Ambulatory Visit (INDEPENDENT_AMBULATORY_CARE_PROVIDER_SITE_OTHER): Payer: Medicare Other | Admitting: Physician Assistant

## 2017-03-13 ENCOUNTER — Telehealth: Payer: Self-pay

## 2017-03-13 ENCOUNTER — Encounter: Payer: Self-pay | Admitting: Physician Assistant

## 2017-03-13 VITALS — BP 124/70 | HR 62 | Ht 63.0 in | Wt 139.0 lb

## 2017-03-13 DIAGNOSIS — I480 Paroxysmal atrial fibrillation: Secondary | ICD-10-CM

## 2017-03-13 DIAGNOSIS — E785 Hyperlipidemia, unspecified: Secondary | ICD-10-CM | POA: Diagnosis not present

## 2017-03-13 DIAGNOSIS — I208 Other forms of angina pectoris: Secondary | ICD-10-CM | POA: Diagnosis not present

## 2017-03-13 DIAGNOSIS — Z01812 Encounter for preprocedural laboratory examination: Secondary | ICD-10-CM

## 2017-03-13 DIAGNOSIS — I209 Angina pectoris, unspecified: Secondary | ICD-10-CM

## 2017-03-13 LAB — PROTIME-INR
INR: 1 (ref 0.8–1.2)
PROTHROMBIN TIME: 10.5 s (ref 9.1–12.0)

## 2017-03-13 LAB — CBC WITH DIFFERENTIAL/PLATELET
BASOS: 1 %
Basophils Absolute: 0 10*3/uL (ref 0.0–0.2)
EOS (ABSOLUTE): 0.6 10*3/uL — ABNORMAL HIGH (ref 0.0–0.4)
EOS: 7 %
HEMATOCRIT: 39.1 % (ref 34.0–46.6)
Hemoglobin: 13.3 g/dL (ref 11.1–15.9)
IMMATURE GRANS (ABS): 0 10*3/uL (ref 0.0–0.1)
IMMATURE GRANULOCYTES: 0 %
LYMPHS: 44 %
Lymphocytes Absolute: 3.4 10*3/uL — ABNORMAL HIGH (ref 0.7–3.1)
MCH: 31.3 pg (ref 26.6–33.0)
MCHC: 34 g/dL (ref 31.5–35.7)
MCV: 92 fL (ref 79–97)
Monocytes Absolute: 0.5 10*3/uL (ref 0.1–0.9)
Monocytes: 6 %
NEUTROS ABS: 3.2 10*3/uL (ref 1.4–7.0)
Neutrophils: 42 %
Platelets: 264 10*3/uL (ref 150–379)
RBC: 4.25 x10E6/uL (ref 3.77–5.28)
RDW: 13.3 % (ref 12.3–15.4)
WBC: 7.6 10*3/uL (ref 3.4–10.8)

## 2017-03-13 LAB — BASIC METABOLIC PANEL
BUN/Creatinine Ratio: 19 (ref 12–28)
BUN: 16 mg/dL (ref 8–27)
CALCIUM: 9 mg/dL (ref 8.7–10.3)
CO2: 23 mmol/L (ref 20–29)
CREATININE: 0.84 mg/dL (ref 0.57–1.00)
Chloride: 102 mmol/L (ref 96–106)
GFR, EST AFRICAN AMERICAN: 75 mL/min/{1.73_m2} (ref >59)
GFR, EST NON AFRICAN AMERICAN: 65 mL/min/{1.73_m2} (ref >59)
Glucose: 84 mg/dL (ref 65–99)
POTASSIUM: 5.3 mmol/L — AB (ref 3.5–5.2)
SODIUM: 140 mmol/L (ref 134–144)

## 2017-03-13 NOTE — Patient Instructions (Signed)
Medication Instructions: Your physician recommends that you continue on your current medications as directed. Please refer to the Current Medication list given to you today.  Labwork: Your physician recommends that you have lab work today: BMET, CBC, PT/INR  Procedures/Testing: Your physician has requested that you have a cardiac catheterization. Cardiac catheterization is used to diagnose and/or treat various heart conditions. Doctors may recommend this procedure for a number of different reasons. The most common reason is to evaluate chest pain. Chest pain can be a symptom of coronary artery disease (CAD), and cardiac catheterization can show whether plaque is narrowing or blocking your heart's arteries. This procedure is also used to evaluate the valves, as well as measure the blood flow and oxygen levels in different parts of your heart. For further information please visit HugeFiesta.[email or web address]. Please follow instruction sheet, as given.  Follow-Up: Your physician recommends that you schedule a follow-up appointment in: 2 -3 WEEKS with Dr. Tamala Julian or Robbie Lis, PA-C  Any Additional Special Instructions Will Be Listed Below (If Applicable).    [place] OFFICE [address] [place] [address] Dept: [phone] Loc: [phone]  Sherry Barnett  03/13/2017  You are scheduled for a Cardiac Catheterization on Thursday, October 4 with Dr. Daneen Schick.  1. Please arrive at the [facility] (Main Entrance A) at [facility]: [address] [place], [place] [address] at 1:00 PM (two hours before your procedure to ensure your preparation). Free valet parking service is available.   Special note: Every effort is made to have your procedure done on time. Please understand that emergencies sometimes delay scheduled procedures.  2. Diet: You may have a light breakfast in the morning NOTHING to eat  after 7 am.  3. Labs: You will need to have blood drawn on Wednesday, October 3 at [facility]. [address]  Open: 7:30am - 5pm    Phone: [phone]. You do not need to be fasting.  4. You may take all your morning medications with enough water to get them down safely. On the morning of your procedure, take your Aspirin and any morning medicines NOT listed above.  You may use sips of water.  5. Plan for one night stay--bring personal belongings. 6. Bring a current list of your medications and current insurance cards. 7. You MUST have a responsible person to drive you home. 8. Someone MUST be with you the first 24 hours after you arrive home or your discharge will be delayed. 9. Please wear clothes that are easy to get on and off and wear slip-on shoes.  Thank you for allowing [place] to care for you!   -- [facility]   If you need a refill on your cardiac medications before your next appointment, please call your pharmacy.

## 2017-03-13 NOTE — Telephone Encounter (Signed)
Patient contacted pre-catheterization at [facility] scheduled for:  03/14/2017 @ 1500 Verified arrival time and place:  NT @ 1300 Confirmed AM meds to be taken pre-cath with sip of water: Take ASA Confirmed patient has responsible person to drive home post procedure and observe patient for 24 hours:  yes Addl concerns:   May have light breakfast-nothing after 7

## 2017-03-14 ENCOUNTER — Encounter (HOSPITAL_COMMUNITY)
Admission: RE | Disposition: A | Discharge status: Home / Self Care | Payer: Self-pay | Source: Ambulatory Visit | Attending: Interventional Cardiology

## 2017-03-14 ENCOUNTER — Ambulatory Visit (HOSPITAL_COMMUNITY)
Admission: RE | Admit: 2017-03-14 | Discharge: 2017-03-15 | Disposition: A | Discharge status: Home / Self Care | Payer: Medicare Other | Source: Ambulatory Visit | Attending: Interventional Cardiology | Admitting: Interventional Cardiology

## 2017-03-14 DIAGNOSIS — I209 Angina pectoris, unspecified: Secondary | ICD-10-CM | POA: Diagnosis not present

## 2017-03-14 DIAGNOSIS — Z882 Allergy status to sulfonamides status: Secondary | ICD-10-CM | POA: Insufficient documentation

## 2017-03-14 DIAGNOSIS — N183 Chronic kidney disease, stage 3 (moderate): Secondary | ICD-10-CM | POA: Insufficient documentation

## 2017-03-14 DIAGNOSIS — Z880 Allergy status to penicillin: Secondary | ICD-10-CM | POA: Insufficient documentation

## 2017-03-14 DIAGNOSIS — Z7982 Long term (current) use of aspirin: Secondary | ICD-10-CM | POA: Diagnosis not present

## 2017-03-14 DIAGNOSIS — E7800 Pure hypercholesterolemia, unspecified: Secondary | ICD-10-CM | POA: Diagnosis not present

## 2017-03-14 DIAGNOSIS — E785 Hyperlipidemia, unspecified: Secondary | ICD-10-CM

## 2017-03-14 DIAGNOSIS — Z8679 Personal history of other diseases of the circulatory system: Secondary | ICD-10-CM

## 2017-03-14 DIAGNOSIS — I25118 Atherosclerotic heart disease of native coronary artery with other forms of angina pectoris: Secondary | ICD-10-CM | POA: Insufficient documentation

## 2017-03-14 DIAGNOSIS — Z87891 Personal history of nicotine dependence: Secondary | ICD-10-CM | POA: Diagnosis not present

## 2017-03-14 DIAGNOSIS — Z79899 Other long term (current) drug therapy: Secondary | ICD-10-CM | POA: Diagnosis not present

## 2017-03-14 DIAGNOSIS — N182 Chronic kidney disease, stage 2 (mild): Secondary | ICD-10-CM | POA: Diagnosis not present

## 2017-03-14 DIAGNOSIS — Z955 Presence of coronary angioplasty implant and graft: Secondary | ICD-10-CM

## 2017-03-14 DIAGNOSIS — I480 Paroxysmal atrial fibrillation: Secondary | ICD-10-CM | POA: Diagnosis not present

## 2017-03-14 HISTORY — PX: CORONARY STENT INTERVENTION: CATH118234

## 2017-03-14 HISTORY — PX: LEFT HEART CATH AND CORONARY ANGIOGRAPHY: CATH118249

## 2017-03-14 HISTORY — DX: Atherosclerotic heart disease of native coronary artery without angina pectoris: I25.10

## 2017-03-14 LAB — CBC
HEMATOCRIT: 37.8 % (ref 36.0–46.0)
Hemoglobin: 12.4 g/dL (ref 12.0–15.0)
MCH: 30.7 pg (ref 26.0–34.0)
MCHC: 32.8 g/dL (ref 30.0–36.0)
MCV: 93.6 fL (ref 78.0–100.0)
Platelets: 204 10*3/uL (ref 150–400)
RBC: 4.04 MIL/uL (ref 3.87–5.11)
RDW: 13.8 % (ref 11.5–15.5)
WBC: 7.1 10*3/uL (ref 4.0–10.5)

## 2017-03-14 LAB — CREATININE, SERUM
CREATININE: 0.87 mg/dL (ref 0.44–1.00)
GFR calc Af Amer: >60 mL/min (ref >60)

## 2017-03-14 LAB — POCT ACTIVATED CLOTTING TIME: Activated Clotting Time: 555 seconds

## 2017-03-14 SURGERY — LEFT HEART CATH AND CORONARY ANGIOGRAPHY
Anesthesia: LOCAL

## 2017-03-14 MED ORDER — SODIUM CHLORIDE 0.9 % IV SOLN
INTRAVENOUS | Status: AC | PRN
  Administered 2017-03-14: 1.75 mg/kg/h via INTRAVENOUS

## 2017-03-14 MED ORDER — ATORVASTATIN CALCIUM 80 MG PO TABS: 80.0000 mg | ORAL_TABLET | Freq: Every day | ORAL | Status: DC

## 2017-03-14 MED ORDER — ASPIRIN EC 81 MG PO TBEC
81.0000 mg | DELAYED_RELEASE_TABLET | Freq: Every day | ORAL | Status: DC
  Administered 2017-03-15: 81 mg via ORAL
  Filled 2017-03-14: qty 1

## 2017-03-14 MED ORDER — HEPARIN SODIUM (PORCINE) 1000 UNIT/ML IJ SOLN
INTRAMUSCULAR | Status: DC | PRN
Start: 2017-03-14 — End: 2017-03-14
  Administered 2017-03-14: 3500 [IU] via INTRAVENOUS

## 2017-03-14 MED ORDER — SODIUM CHLORIDE 0.9 % IV SOLN: 250.0000 mL | INTRAVENOUS | Status: DC | PRN

## 2017-03-14 MED ORDER — METOPROLOL SUCCINATE ER 25 MG PO TB24
25.0000 mg | ORAL_TABLET | Freq: Every day | ORAL | Status: DC
  Administered 2017-03-15: 25 mg via ORAL
  Filled 2017-03-14: qty 1

## 2017-03-14 MED ORDER — HYDRALAZINE HCL 20 MG/ML IJ SOLN: 5.0000 mg | INTRAMUSCULAR | Status: AC | PRN

## 2017-03-14 MED ORDER — NITROGLYCERIN 1 MG/10 ML FOR IR/CATH LAB
INTRA_ARTERIAL | Status: DC | PRN
  Administered 2017-03-14: 200 ug via INTRACORONARY

## 2017-03-14 MED ORDER — MIDAZOLAM HCL 2 MG/2ML IJ SOLN
INTRAMUSCULAR | Status: DC | PRN
  Administered 2017-03-14: 1 mg via INTRAVENOUS

## 2017-03-14 MED ORDER — ONDANSETRON HCL 4 MG/2ML IJ SOLN: 4.0000 mg | Freq: Four times a day (QID) | INTRAMUSCULAR | Status: DC | PRN

## 2017-03-14 MED ORDER — SODIUM CHLORIDE 0.9 % WEIGHT BASED INFUSION: 1.0000 mL/kg/h | INTRAVENOUS | Status: AC

## 2017-03-14 MED ORDER — TICAGRELOR 90 MG PO TABS
ORAL_TABLET | ORAL | Status: AC
  Filled 2017-03-14: qty 2

## 2017-03-14 MED ORDER — ACETAMINOPHEN 325 MG PO TABS: 650.0000 mg | ORAL_TABLET | ORAL | Status: DC | PRN

## 2017-03-14 MED ORDER — HEPARIN SODIUM (PORCINE) 5000 UNIT/ML IJ SOLN
5000.0000 [IU] | Freq: Three times a day (TID) | INTRAMUSCULAR | Status: DC
  Administered 2017-03-15: 5000 [IU] via SUBCUTANEOUS
  Filled 2017-03-14: qty 1

## 2017-03-14 MED ORDER — TICAGRELOR 90 MG PO TABS
90.0000 mg | ORAL_TABLET | Freq: Two times a day (BID) | ORAL | Status: DC
  Administered 2017-03-15 (×2): 90 mg via ORAL
  Filled 2017-03-14 (×2): qty 1

## 2017-03-14 MED ORDER — FENTANYL CITRATE (PF) 100 MCG/2ML IJ SOLN
INTRAMUSCULAR | Status: AC
  Filled 2017-03-14: qty 2

## 2017-03-14 MED ORDER — SODIUM CHLORIDE 0.9% FLUSH: 3.0000 mL | Freq: Two times a day (BID) | INTRAVENOUS | Status: DC

## 2017-03-14 MED ORDER — LIDOCAINE HCL (PF) 1 % IJ SOLN
INTRAMUSCULAR | Status: DC | PRN
  Administered 2017-03-14: 2 mL

## 2017-03-14 MED ORDER — SODIUM CHLORIDE 0.9% FLUSH: 3.0000 mL | INTRAVENOUS | Status: DC | PRN

## 2017-03-14 MED ORDER — FENTANYL CITRATE (PF) 100 MCG/2ML IJ SOLN
INTRAMUSCULAR | Status: DC | PRN
  Administered 2017-03-14: 50 ug via INTRAVENOUS

## 2017-03-14 MED ORDER — SODIUM CHLORIDE 0.9 % WEIGHT BASED INFUSION: 1.0000 mL/kg/h | INTRAVENOUS | Status: DC

## 2017-03-14 MED ORDER — HEPARIN SODIUM (PORCINE) 1000 UNIT/ML IJ SOLN
INTRAMUSCULAR | Status: AC
  Filled 2017-03-14: qty 1

## 2017-03-14 MED ORDER — BIVALIRUDIN BOLUS VIA INFUSION - CUPID
INTRAVENOUS | Status: DC | PRN
  Administered 2017-03-14: 46.575 mg via INTRAVENOUS

## 2017-03-14 MED ORDER — VERAPAMIL HCL 2.5 MG/ML IV SOLN
INTRAVENOUS | Status: AC
  Filled 2017-03-14: qty 2

## 2017-03-14 MED ORDER — IOPAMIDOL (ISOVUE-370) INJECTION 76%
INTRAVENOUS | Status: AC
  Filled 2017-03-14: qty 100

## 2017-03-14 MED ORDER — VERAPAMIL HCL 2.5 MG/ML IV SOLN
INTRAVENOUS | Status: DC | PRN
  Administered 2017-03-14: 10 mL via INTRA_ARTERIAL

## 2017-03-14 MED ORDER — IOPAMIDOL (ISOVUE-370) INJECTION 76%
INTRAVENOUS | Status: DC | PRN
  Administered 2017-03-14: 155 mL via INTRA_ARTERIAL

## 2017-03-14 MED ORDER — ASPIRIN 81 MG PO CHEW: 81.0000 mg | CHEWABLE_TABLET | Freq: Every day | ORAL | Status: DC

## 2017-03-14 MED ORDER — SODIUM CHLORIDE 0.9% FLUSH
3.0000 mL | Freq: Two times a day (BID) | INTRAVENOUS | Status: DC
  Administered 2017-03-15: 3 mL via INTRAVENOUS

## 2017-03-14 MED ORDER — VITAMIN D 1000 UNITS PO TABS
1000.0000 [IU] | ORAL_TABLET | Freq: Every day | ORAL | Status: DC
  Administered 2017-03-15: 1000 [IU] via ORAL
  Filled 2017-03-14 (×2): qty 1

## 2017-03-14 MED ORDER — OXYCODONE HCL 5 MG PO TABS: 5.0000 mg | ORAL_TABLET | ORAL | Status: DC | PRN

## 2017-03-14 MED ORDER — ANGIOPLASTY BOOK
Freq: Once | Status: AC
  Administered 2017-03-14: 23:00:00
  Filled 2017-03-14: qty 1

## 2017-03-14 MED ORDER — NITROGLYCERIN 0.4 MG SL SUBL: 0.4000 mg | SUBLINGUAL_TABLET | SUBLINGUAL | Status: DC | PRN

## 2017-03-14 MED ORDER — NITROGLYCERIN 1 MG/10 ML FOR IR/CATH LAB
INTRA_ARTERIAL | Status: AC
  Filled 2017-03-14: qty 10

## 2017-03-14 MED ORDER — LABETALOL HCL 5 MG/ML IV SOLN: 10.0000 mg | INTRAVENOUS | Status: AC | PRN

## 2017-03-14 MED ORDER — HEPARIN (PORCINE) IN NACL 2-0.9 UNIT/ML-% IJ SOLN
INTRAMUSCULAR | Status: AC | PRN
  Administered 2017-03-14: 1000 mL

## 2017-03-14 MED ORDER — BIVALIRUDIN TRIFLUOROACETATE 250 MG IV SOLR
INTRAVENOUS | Status: AC
  Filled 2017-03-14: qty 250

## 2017-03-14 MED ORDER — ASPIRIN 81 MG PO CHEW: 81.0000 mg | CHEWABLE_TABLET | ORAL | Status: DC

## 2017-03-14 MED ORDER — MIDAZOLAM HCL 2 MG/2ML IJ SOLN
INTRAMUSCULAR | Status: AC
  Filled 2017-03-14: qty 2

## 2017-03-14 MED ORDER — HEPARIN (PORCINE) IN NACL 2-0.9 UNIT/ML-% IJ SOLN
INTRAMUSCULAR | Status: AC
  Filled 2017-03-14: qty 1000

## 2017-03-14 MED ORDER — AZELAIC ACID 15 % EX GEL: 1.0000 "application " | Freq: Every day | CUTANEOUS | Status: DC

## 2017-03-14 MED ORDER — TICAGRELOR 90 MG PO TABS
ORAL_TABLET | ORAL | Status: DC | PRN
  Administered 2017-03-14: 180 mg via ORAL

## 2017-03-14 MED ORDER — SODIUM CHLORIDE 0.9 % WEIGHT BASED INFUSION
3.0000 mL/kg/h | INTRAVENOUS | Status: DC
  Administered 2017-03-14: 3 mL/kg/h via INTRAVENOUS

## 2017-03-14 SURGICAL SUPPLY — 19 items
BALLN EMERGE MR 2.5X12 (BALLOONS) ×2
BALLN SAPPHIRE ~~LOC~~ 3.0X12 (BALLOONS) ×1 IMPLANT
BALLOON EMERGE MR 2.5X12 (BALLOONS) IMPLANT
CATH 5FR JL3.5 JR4 ANG PIG MP (CATHETERS) ×1 IMPLANT
CATH VISTA GUIDE 6FR XBLAD3.0 (CATHETERS) ×1 IMPLANT
COVER PRB 48X5XTLSCP FOLD TPE (BAG) IMPLANT
COVER PROBE 5X48 (BAG) ×2
DEVICE RAD COMP TR BAND LRG (VASCULAR PRODUCTS) ×1 IMPLANT
GLIDESHEATH SLEND A-KIT 6F 22G (SHEATH) ×1 IMPLANT
GUIDEWIRE INQWIRE 1.5J.035X260 (WIRE) IMPLANT
INQWIRE 1.5J .035X260CM (WIRE) ×2
KIT ENCORE 26 ADVANTAGE (KITS) ×1 IMPLANT
KIT HEART LEFT (KITS) ×2 IMPLANT
PACK CARDIAC CATHETERIZATION (CUSTOM PROCEDURE TRAY) ×2 IMPLANT
STENT SIERRA 2.50 X 23 MM (Permanent Stent) ×1 IMPLANT
TRANSDUCER W/STOPCOCK (MISCELLANEOUS) ×2 IMPLANT
TUBING CIL FLEX 10 FLL-RA (TUBING) ×2 IMPLANT
WIRE ASAHI PROWATER 180CM (WIRE) ×2 IMPLANT
WIRE HI TORQ VERSACORE-J 145CM (WIRE) ×1 IMPLANT

## 2017-03-14 NOTE — H&P (View-Only) (Signed)
Cardiology Office Note    Date:  03/13/2017   ID:  Sherry, Barnett December 09, 1932, MRN [id number]  PCP:  Sherry Manes, MD  Cardiologist:  Dr. Tamala Barnett   Chief Complaint: 2 weeks follow up for angina  History of Present Illness:   Sherry Barnett is a 83 y.o. female with a history of paroxysmal atrial fibrillation remotely, chronic kidney disease stage II-III, hypercholesterolemia presents for follow-up.  Seen by Dr. Tamala Barnett 02/26/17 to established care for new onset angina x 8 weeks. Pending 30 day monitor for evaluation of afib. Follow-up stress test low risk however Baseline EKG showed NSR with < 0.58mm ST elevation in V2. Recommended continue medical therapy. If no improvement or worsening of of symptoms, plan for coronary angiography.  Patient is here for follow-up. He continues to have exertional chest pain with shortness of breath. Relieves with rest. He did not require any nitroglycerin. Last Saturday she was walking uphill and needed to stop multiple times. This morning she also had an episode while picking up flowers that resolved with rest. Also complains of intermittent palpitation. This morning she called device company during palpitation however no event noted per patient. She hasn't started Lipitor yet. She has limited her activity due to exertional angina.    Past Medical History:  Diagnosis Date  . A-fib ([place])   . Bilateral cataracts   . Blepharitis    chronic  . Cardiovascular risk factor    23%  . Degenerative joint disease (DJD) of lumbar spine   . Diverticulosis   . Endometriosis   . Fibroids   . Hematuria, microscopic [id number]  . Hemorrhoids   . Hypercholesterolemia    10 years  . Osteopenia 03/2016   start alendrodate  . Renal disease    stage2/stage 3    Past Surgical History:  Procedure Laterality Date  . APPENDECTOMY    . CATARACT EXTRACTION, BILATERAL Bilateral 2001 & 11/2016  . ovarian cysts Bilateral   . tubes removal Bilateral    endometriosis     Current Medications: Prior to Admission medications   Medication Sig Start Date End Date Taking? Authorizing Provider  alendronate (FOSAMAX) 70 MG tablet Take 70 mg by mouth once a week. Take with a full glass of water on an empty stomach.    [provider]  aspirin EC 81 MG tablet Take 1 tablet (81 mg total) by mouth daily. 02/26/17   Sherry Crome, MD  atorvastatin (LIPITOR) 10 MG tablet Take 10 mg by mouth once a week.    [provider]  Azelaic Acid (FINACEA) 15 % cream Apply topically 2 (two) times daily. After skin is thoroughly washed and patted dry, gently but thoroughly massage a thin film of azelaic acid cream into the affected area twice daily, in the morning and evening.    [provider]  cholecalciferol (VITAMIN D) 1000 units tablet Take 1,000 Units by mouth daily.    [provider]  metoprolol succinate (TOPROL XL) 25 MG 24 hr tablet Take 1 tablet (25 mg total) by mouth daily. 02/26/17   Sherry Crome, MD  nitroGLYCERIN (NITROSTAT) 0.4 MG SL tablet Place 1 tablet (0.4 mg total) under the tongue every 5 (five) minutes as needed for chest pain. 02/26/17 05/27/17  Sherry Crome, MD  tobramycin-dexamethasone [facility]) ophthalmic ointment 1 application daily.    [provider]    Allergies:   Sulfa antibiotics; Novocain [procaine]; and Penicillins   Social History   Social  History  . Marital status: Single    Spouse name: N/A  . Number of children: N/A  . Years of education: N/A   Social History Main Topics  . Smoking status: Former Smoker    Years: 5.00    Types: Cigarettes    Quit date: 02/06/1969  . Smokeless tobacco: Never Used  . Alcohol use No  . Drug use: No  . Sexual activity: Not Asked     Comment: MARRIED   Other Topics Concern  . None   Social History Narrative  . None     Family History:  The patient's family history includes Alzheimer's disease in her father; Heart attack in her mother;  Hypertension in her father; Other (age of onset: 74) in her sister; Stroke in her father.   ROS:   Please see the history of present illness.    ROS All other systems reviewed and are negative.   PHYSICAL EXAM:   VS:  BP 124/70   Pulse 62   Ht 5\' 3"  (1.6 m)   Wt 139 lb (63 kg)   SpO2 98%   BMI 24.62 kg/m    GEN: Well nourished, well developed, in no acute distress  HEENT: normal  Neck: no JVD, carotid bruits, or masses Cardiac: RRR; no murmurs, rubs, or gallops,no edema  Respiratory:  clear to auscultation bilaterally, normal work of breathing GI: soft, nontender, nondistended, + BS MS: no deformity or atrophy  Skin: warm and dry, no rash Neuro:  Alert and Oriented x 3, Strength and sensation are intact Psych: euthymic mood, full affect  Wt Readings from Last 3 Encounters:  03/13/17 139 lb (63 kg)  02/27/17 140 lb (63.5 kg)  02/26/17 140 lb 9.6 oz (63.8 kg)      Studies/Labs Reviewed:   EKG:  EKG is not ordered today.    Recent Labs: No results found for requested labs within last 8760 hours.   Lipid Panel No results found for: CHOL, TRIG, HDL, CHOLHDL, VLDL, LDLCALC, LDLDIRECT  Additional studies/ records that were reviewed today include:   Stress test 02/27/17  Nuclear stress EF: 68%.  Baseline EKG showed NSR with < 0.16mm ST elevation in V2  There was no ST segment deviation noted during stress from baseline EKG.  The study is normal.  This is a low risk study.  The left ventricular ejection fraction is normal (55-65%).     ASSESSMENT & PLAN:    1. Stable angina pectoris - Low risk stress test however she continues to have exertional angina. Relives with Rest. This is limiting her activity. Take PRN nitro. Reviewed with Dr. Tamala Barnett. Plan for cath tomorrow.  Continue ASA, statin and BB. -The patient understands that risks include but are not limited to stroke (1 in 1000), death (1 in 75), kidney failure [usually temporary] (1 in 500), bleeding (1 in  200), allergic reaction [possibly serious] (1 in 200), and agrees to proceed.   2. Remote hx of afib - Pending 30 days monitor. Intermittent palpitations.   3. HLD - Start statin.  Medication Adjustments/Labs and Tests Ordered: Current medicines are reviewed at length with the patient today.  Concerns regarding medicines are outlined above.  Medication changes, Labs and Tests ordered today are listed in the Patient Instructions below. Patient Instructions  Medication Instructions: Your physician recommends that you continue on your current medications as directed. Please refer to the Current Medication list given to you today.  Labwork: Your physician recommends that you have lab work  today: BMET, CBC, PT/INR  Procedures/Testing: Your physician has requested that you have a cardiac catheterization. Cardiac catheterization is used to diagnose and/or treat various heart conditions. Doctors may recommend this procedure for a number of different reasons. The most common reason is to evaluate chest pain. Chest pain can be a symptom of coronary artery disease (CAD), and cardiac catheterization can show whether plaque is narrowing or blocking your heart's arteries. This procedure is also used to evaluate the valves, as well as measure the blood flow and oxygen levels in different parts of your heart. For further information please visit [email or web address]. Please follow instruction sheet, as given.  Follow-Up: Your physician recommends that you schedule a follow-up appointment in: 2 -3 WEEKS with Dr. Tamala Barnett or Robbie Lis, PA-C  Any Additional Special Instructions Will Be Listed Below (If Applicable).    [facility] OFFICE [address] [place] [address] Dept: [phone] Loc: [phone]  MYLANI GENTRY  03/13/2017  You are scheduled for a Cardiac Catheterization on Thursday, October 4 with Dr.  Daneen Schick.  1. Please arrive at the [facility] (Main Entrance A) at [facility]: [address] [place], [place] [address] at 1:00 PM (two hours before your procedure to ensure your preparation). Free valet parking service is available.   Special note: Every effort is made to have your procedure done on time. Please understand that emergencies sometimes delay scheduled procedures.  2. Diet: You may have a light breakfast in the morning NOTHING to eat after 7 am.  3. Labs: You will need to have blood drawn on Wednesday, October 3 at [facility] at [facility]. [address]  Open: 7:30am - 5pm    Phone: [phone]. You do not need to be fasting.  4. You may take all your morning medications with enough water to get them down safely. On the morning of your procedure, take your Aspirin and any morning medicines NOT listed above.  You may use sips of water.  5. Plan for one night stay--bring personal belongings. 6. Bring a current list of your medications and current insurance cards. 7. You MUST have a responsible person to drive you home. 8. Someone MUST be with you the first 24 hours after you arrive home or your discharge will be delayed. 9. Please wear clothes that are easy to get on and off and wear slip-on shoes.  Thank you for allowing [place] to care for you!   -- [facility]   If you need a refill on your cardiac medications before your next appointment, please call your pharmacy.      Jarrett Soho, [place]  03/13/2017 10:53 AM    [facility] [place], [place], [place]  [address] Phone: [phone]; Fax: [phone]

## 2017-03-14 NOTE — Interval H&P Note (Signed)
Cath Lab Visit (complete for each Cath Lab visit)  Clinical Evaluation Leading to the Procedure:   ACS: No.  Non-ACS:    Anginal Classification: CCS Barnett  Anti-ischemic medical therapy: Minimal Therapy (1 class of medications)  Non-Invasive Test Results: Low-risk stress test findings: cardiac mortality <1%/year  Prior CABG: No previous CABG      History and Physical Interval Note:  03/14/2017 3:34 PM  Sherry Barnett  has presented today for surgery, with the diagnosis of unstable angina  The various methods of treatment have been discussed with the patient and family. After consideration of risks, benefits and other options for treatment, the patient has consented to  Procedure(s): LEFT HEART CATH AND CORONARY ANGIOGRAPHY (N/A) as a surgical intervention .  The patient's history has been reviewed, patient examined, no change in status, stable for surgery.  I have reviewed the patient's chart and labs.  Questions were answered to the patient's satisfaction.     Sherry Barnett

## 2017-03-15 ENCOUNTER — Encounter (HOSPITAL_COMMUNITY): Payer: Self-pay | Admitting: Interventional Cardiology

## 2017-03-15 DIAGNOSIS — Z8679 Personal history of other diseases of the circulatory system: Secondary | ICD-10-CM | POA: Diagnosis not present

## 2017-03-15 DIAGNOSIS — N182 Chronic kidney disease, stage 2 (mild): Secondary | ICD-10-CM | POA: Diagnosis not present

## 2017-03-15 DIAGNOSIS — N183 Chronic kidney disease, stage 3 (moderate): Secondary | ICD-10-CM | POA: Diagnosis not present

## 2017-03-15 DIAGNOSIS — Z79899 Other long term (current) drug therapy: Secondary | ICD-10-CM | POA: Diagnosis not present

## 2017-03-15 DIAGNOSIS — E785 Hyperlipidemia, unspecified: Secondary | ICD-10-CM | POA: Diagnosis not present

## 2017-03-15 DIAGNOSIS — I25118 Atherosclerotic heart disease of native coronary artery with other forms of angina pectoris: Secondary | ICD-10-CM | POA: Diagnosis not present

## 2017-03-15 DIAGNOSIS — E7800 Pure hypercholesterolemia, unspecified: Secondary | ICD-10-CM | POA: Diagnosis not present

## 2017-03-15 DIAGNOSIS — Z7982 Long term (current) use of aspirin: Secondary | ICD-10-CM | POA: Diagnosis not present

## 2017-03-15 DIAGNOSIS — I480 Paroxysmal atrial fibrillation: Secondary | ICD-10-CM

## 2017-03-15 DIAGNOSIS — Z87891 Personal history of nicotine dependence: Secondary | ICD-10-CM | POA: Diagnosis not present

## 2017-03-15 DIAGNOSIS — Z880 Allergy status to penicillin: Secondary | ICD-10-CM | POA: Diagnosis not present

## 2017-03-15 DIAGNOSIS — Z882 Allergy status to sulfonamides status: Secondary | ICD-10-CM | POA: Diagnosis not present

## 2017-03-15 DIAGNOSIS — I209 Angina pectoris, unspecified: Secondary | ICD-10-CM | POA: Diagnosis not present

## 2017-03-15 LAB — BASIC METABOLIC PANEL
Anion gap: 9 (ref 5–15)
BUN: 17 mg/dL (ref 6–20)
CO2: 23 mmol/L (ref 22–32)
Calcium: 8.3 mg/dL — ABNORMAL LOW (ref 8.9–10.3)
Chloride: 104 mmol/L (ref 101–111)
Creatinine, Ser: 0.97 mg/dL (ref 0.44–1.00)
GFR calc Af Amer: >60 mL/min (ref >60)
GFR calc non Af Amer: 53 mL/min — ABNORMAL LOW (ref >60)
Glucose, Bld: 84 mg/dL (ref 65–99)
Potassium: 3.8 mmol/L (ref 3.5–5.1)
Sodium: 136 mmol/L (ref 135–145)

## 2017-03-15 LAB — CBC
HCT: 35.6 % — ABNORMAL LOW (ref 36.0–46.0)
Hemoglobin: 11.7 g/dL — ABNORMAL LOW (ref 12.0–15.0)
MCH: 30.7 pg (ref 26.0–34.0)
MCHC: 32.9 g/dL (ref 30.0–36.0)
MCV: 93.4 fL (ref 78.0–100.0)
PLATELETS: 201 10*3/uL (ref 150–400)
RBC: 3.81 MIL/uL — AB (ref 3.87–5.11)
RDW: 13.6 % (ref 11.5–15.5)
WBC: 8.3 10*3/uL (ref 4.0–10.5)

## 2017-03-15 MED ORDER — ATORVASTATIN CALCIUM 80 MG PO TABS: 80.0000 mg | ORAL_TABLET | Freq: Every day | ORAL | 2 refills | Status: DC

## 2017-03-15 MED ORDER — TICAGRELOR 90 MG PO TABS: 90.0000 mg | ORAL_TABLET | Freq: Two times a day (BID) | ORAL | 1 refills | Status: DC

## 2017-03-15 NOTE — Care Management Note (Addendum)
Case Management Note  Patient Details  Name: Sherry Barnett MRN: [id number] Date of Birth: 1932-09-01  Subjective/Objective:   From home with spouse, pta indep, s/p  Coronary stent intervention will be on brilinta, NCM awaiting benefit check. NCM gave patient the 30 day free coupon card, Spouse states he will be going to [facility] on [address], they do have the brilinta in stock and they do take the coupons.  She has a PCP and medication coverge.   NCM gave patient co pay amt, which is too high, NCM notified [place] PA , she states on follow up they will switch to plavix.             Action/Plan: NCM will follow for dc needs.   Expected Discharge Date:                  Expected Discharge Plan:  Home/Self Care  In-House Referral:     Discharge planning Services  CM Consult  Post Acute Care Choice:    Choice offered to:     DME Arranged:    DME Agency:     HH Arranged:    [place] Agency:     Status of Service:  Completed, signed off  If discussed at [employer] of Stay Meetings, dates discussed:    Additional Comments:  Zenon Mayo, RN 03/15/2017, 9:49 AM

## 2017-03-15 NOTE — Progress Notes (Signed)
CARDIAC REHAB PHASE I   PRE:  Rate/Rhythm: 65 SR  BP:  Sitting: 116/50   MODE:  Ambulation: 380 ft   POST:  Rate/Rhythm: 75 SR  BP:  Sitting: 133/40         SaO2: 100 RA  Pt ambulated 380 ft on RA, independent, steady gait, tolerated well with no complaints. Completed PCI/stent education with pt and husband at bedside.  Reviewed risk factors, anti-platelet therapy, stent card, activity restrictions, ntg, exercise, heart healthy diet and phase 2 cardiac rehab. Pt verbalized understanding, receptive to education. Pt agrees to phase 2 cardiac rehab, will send to [facility] per pt request.    [id number] Sherry Sciara, RN, BSN 03/15/2017 9:36 AM

## 2017-03-15 NOTE — Discharge Summary (Signed)
Discharge Summary    Patient ID: XIAMARA HULET,  MRN: [id number], DOB/AGE: 1932/06/12 83 y.o.  Admit date: 03/14/2017 Discharge date: 03/15/2017  Primary Care Provider: Lajean Manes Primary Cardiologist: Tamala Julian   Discharge Diagnoses    Principal Problem:   Angina pectoris [facility]) Active Problems:   Hyperlipidemia LDL goal <70   Paroxysmal atrial fibrillation (HCC)   H/O class III angina pectoris   Allergies Allergies  Allergen Reactions  . Sulfa Antibiotics Other (See Comments)    HEMATURIA  . Novocain [Procaine] Palpitations  . Penicillins Rash    Has patient had a PCN reaction causing immediate rash, facial/tongue/throat swelling, SOB or lightheadedness with hypotension: Yes Has patient had a PCN reaction causing severe rash involving mucus membranes or skin necrosis: Unknown Has patient had a PCN reaction that required hospitalization: No Has patient had a PCN reaction occurring within the last 10 years: No If all of the above answers are "NO", then may proceed with Cephalosporin use.     Diagnostic Studies/Procedures    Cath: 03/14/17  Conclusion     Dist Cx lesion, 50 %stenosed.    Normal left main  60% proximal and 90-95% mid [name] stenosis. Lesions are within 12 mm. The [name] is moderately tortuous.  50% ostial RCA.  50% distal circumflex before the origin of the dominant obtuse marginal branch.  Mid anterior wall hypokinesis. Estimated ejection fraction 50-55%. Normal filling pressures with EDP of 11 mmHg.  Successful stent of the proximal to mid [name] reducing the 60 and 95% tandem stenoses to 0% with TIMI grade 3 flow. 2.5 x 23 Sierra and DES postdilated to 3.0 mm in diameter. Balloon inflations reproduced symptoms.  RECOMMENDATIONS:   Aggressive risk factor modification including high-intensity statin therapy.  Discharge in a.m.  Continue beta blocker.  Aspirin and Brilinta for at least 6 months.    _____________   History of Present  Illness     Dashana E Oplinger is a 83 y.o. female with a history of paroxysmal atrial fibrillation remotely, chronic kidney disease stage II-III, hypercholesterolemia presented for follow-up.  Seen by Dr. Tamala Julian 02/26/17 to established care for new onset angina x 8 weeks. Pending 30 day monitor for evaluation of afib. Follow-up stress test low risk however Baseline EKG showed NSR with < 0.34mm ST elevation in V2. Recommended continue medical therapy. If no improvement or worsening of of symptoms, plan for coronary angiography.  Patient presented for follow up on 03/13/17. She continued to have exertional chest pain with shortness of breath. Relieved with rest. She did not require any nitroglycerin. Saturday prior to her office visit she was walking uphill and needed to stop multiple times. The morning of her office visit she also had an episode while picking up flowers that resolved with rest. Also complained of intermittent palpitation. Given her symptoms she was referred for outpatient cath.   Hospital Course     Underwent cardiac cath with Dr. Tamala Julian noted above with successful PCI/DES to the p/mLAD. Plan for DAPT with ASA/Brilinta for at least 6 months. Will further increase her Lipitor from 10mg  daily to 80mg  daily. Already on BB therapy prior to cath. Post cath labs showed stable Cr 0.97 and Hgb 11.7. Worked well with cardiac rehab without any recurrent chest pain. CM check on Brilinta noted to be fairly expensive. Given a 30 day card, but will likely need to switch to plavix afterwards.   General: Well developed, well nourished, female appearing in no acute distress. Head:  Normocephalic, atraumatic.  Neck: Supple without bruits, JVD. Lungs:  Resp regular and unlabored, CTA. Heart: RRR, S1, S2, no S3, S4, or murmur; no rub. Abdomen: Soft, non-tender, non-distended with normoactive bowel sounds. No hepatomegaly. No rebound/guarding. No obvious abdominal masses. Extremities: No clubbing, cyanosis,  edema. Distal pedal pulses are 2+ bilaterally. R radial cath site stable but with bruising, no hematoma Neuro: Alert and oriented X 3. Moves all extremities spontaneously. Psych: Normal affect.  Sophiarose E Udovich was seen by Dr. Irish Lack and determined stable for discharge home. Follow up in the office has been arranged. Medications are listed below.   _____________  Discharge Vitals Blood pressure (!) 115/52, pulse 60, temperature 97.7 F (36.5 C), temperature source Oral, resp. rate 13, height 5\' 4"  (1.626 m), weight 137 lb (62.1 kg), SpO2 97 %.  Filed Weights   03/14/17 1258  Weight: 137 lb (62.1 kg)    Labs & Radiologic Studies    CBC  Recent Labs  03/13/17 1109 03/14/17 1850 03/15/17 0339  WBC 7.6 7.1 8.3  NEUTROABS 3.2  --   --   HGB 13.3 12.4 11.7*  HCT 39.1 37.8 35.6*  MCV 92 93.6 93.4  PLT 264 204 195   Basic Metabolic Panel  Recent Labs  03/13/17 1109 03/14/17 1850 03/15/17 0339  NA 140  --  136  K 5.3*  --  3.8  CL 102  --  104  CO2 23  --  23  GLUCOSE 84  --  84  BUN 16  --  17  CREATININE 0.84 0.87 0.97  CALCIUM 9.0  --  8.3*   Liver Function Tests No results for input(s): AST, ALT, ALKPHOS, BILITOT, PROT, ALBUMIN in the last 72 hours. No results for input(s): LIPASE, AMYLASE in the last 72 hours. Cardiac Enzymes No results for input(s): CKTOTAL, CKMB, CKMBINDEX, TROPONINI in the last 72 hours. BNP Invalid input(s): POCBNP D-Dimer No results for input(s): DDIMER in the last 72 hours. Hemoglobin A1C No results for input(s): HGBA1C in the last 72 hours. Fasting Lipid Panel No results for input(s): CHOL, HDL, LDLCALC, TRIG, CHOLHDL, LDLDIRECT in the last 72 hours. Thyroid Function Tests No results for input(s): TSH, T4TOTAL, T3FREE, THYROIDAB in the last 72 hours.  Invalid input(s): FREET3 _____________  No results found. Disposition   Pt is being discharged home today in good condition.  Follow-up Plans & Appointments    Follow-up  Information    [place], Crista Luria, [place] Follow up on 03/28/2017.   Specialty:  Cardiology Why:  at 8am for your follow up appt.  Contact information: [place] [address] [phone]          Discharge Instructions    Amb Referral to Cardiac Rehabilitation    Complete by:  As directed    Diagnosis:  Coronary Stents   Call MD for:  redness, tenderness, or signs of infection (pain, swelling, redness, odor or green/yellow discharge around incision site)    Complete by:  As directed    Diet - low sodium heart healthy    Complete by:  As directed    Discharge instructions    Complete by:  As directed    Radial Site Care Refer to this sheet in the next few weeks. These instructions provide you with information on caring for yourself after your procedure. Your caregiver may also give you more specific instructions. Your treatment has been planned according to current medical practices, but problems sometimes occur. Call your  caregiver if you have any problems or questions after your procedure. HOME CARE INSTRUCTIONS You may shower the day after the procedure.Remove the bandage (dressing) and gently wash the site with plain soap and water.Gently pat the site dry.  Do not apply powder or lotion to the site.  Do not submerge the affected site in water for 3 to 5 days.  Inspect the site at least twice daily.  Do not flex or bend the affected arm for 24 hours.  No lifting over 5 pounds (2.3 kg) for 5 days after your procedure.  Do not drive home if you are discharged the same day of the procedure. Have someone else drive you.  You may drive 24 hours after the procedure unless otherwise instructed by your caregiver.  What to expect: Any bruising will usually fade within 1 to 2 weeks.  Blood that collects in the tissue (hematoma) may be painful to the touch. It should usually decrease in size and tenderness within 1 to 2 weeks.  SEEK IMMEDIATE MEDICAL CARE IF: You  have unusual pain at the radial site.  You have redness, warmth, swelling, or pain at the radial site.  You have drainage (other than a small amount of blood on the dressing).  You have chills.  You have a fever or persistent symptoms for more than 72 hours.  You have a fever and your symptoms suddenly get worse.  Your arm becomes pale, cool, tingly, or numb.  You have heavy bleeding from the site. Hold pressure on the site.   PLEASE DO NOT MISS ANY DOSES OF YOUR BRILINTA!!!!! Also keep a log of you blood pressures and bring back to your follow up appt. Please call the office with any questions.   Patients taking blood thinners should generally stay away from medicines like ibuprofen, Advil, Motrin, naproxen, and Aleve due to risk of stomach bleeding. You may take Tylenol as directed or talk to your primary doctor about alternatives.   Increase activity slowly    Complete by:  As directed       Discharge Medications     Medication List    TAKE these medications   alendronate 70 MG tablet Commonly known as:  FOSAMAX Take 70 mg by mouth every Saturday. Take with a full glass of water on an empty stomach.   aspirin EC 81 MG tablet Take 1 tablet (81 mg total) by mouth daily. What changed:  when to take this   atorvastatin 80 MG tablet Commonly known as:  LIPITOR Take 1 tablet (80 mg total) by mouth daily at 6 PM. What changed:  medication strength  how much to take  when to take this  additional instructions   cholecalciferol 1000 units tablet Commonly known as:  VITAMIN D Take 1,000 Units by mouth daily.   FINACEA 15 % cream Generic drug:  Azelaic Acid Apply 1 application topically at bedtime. After skin is thoroughly washed and patted dry, gently but thoroughly massage a thin film of azelaic acid cream into the affected area twice daily, in the morning and evening.   metoprolol succinate 25 MG 24 hr tablet Commonly known as:  TOPROL XL Take 1 tablet (25 mg total)  by mouth daily. What changed:  when to take this   nitroGLYCERIN 0.4 MG SL tablet Commonly known as:  NITROSTAT Place 1 tablet (0.4 mg total) under the tongue every 5 (five) minutes as needed for chest pain.   ticagrelor 90 MG Tabs tablet Commonly known as:  BRILINTA Take 1 tablet (90 mg total) by mouth 2 (two) times daily.   tobramycin-dexamethasone ophthalmic ointment Commonly known as:  TOBRADEX Place 1 application into both eyes See admin instructions. Tuesday AND Saturday patient does not use. Will apply at night on all other nights.        Aspirin prescribed at discharge?  Yes High Intensity Statin Prescribed? (Lipitor 40-80mg  or Crestor 20-40mg ): Yes Beta Blocker Prescribed? Yes For EF <40%, was ACEI/ARB Prescribed? No: EF ok ADP Receptor Inhibitor Prescribed? (i.e. Plavix etc.-Includes Medically Managed Patients): Yes For EF <40%, Aldosterone Inhibitor Prescribed? No: EF ok Was EF assessed during THIS hospitalization? Yes Was Cardiac Rehab II ordered? (Included Medically managed Patients): Yes   Outstanding Labs/Studies   FLP/LFTs in 6 weeks if tolerating statin  Duration of Discharge Encounter   Greater than 30 minutes including physician time.  Signed, Reino Bellis NP-C 03/15/2017, 10:52 AM   I have examined the patient and reviewed assessment and plan and discussed with patient.  Agree with above as stated.    GEN: Well nourished, well developed, in no acute distress  HEENT: normal  Neck: no JVD, carotid bruits, or masses Cardiac: RRR; no murmurs, rubs, or gallops,no edema  Respiratory:  clear to auscultation bilaterally, normal work of breathing GI: soft, nontender, nondistended,  MS: no deformity or atrophy ; bruising of the right wrist noted Skin: warm and dry, no rash Neuro:  Strength and sensation are intact Psych: euthymic mood, full affect  Going home on Brilinta.  THis could be changed to Plavix if it was very expensive based on her drug  coverage.  Did well with cardiac rehab.  Plan discharge today along with aggressive secondary prevention.    Larae Grooms

## 2017-03-15 NOTE — Progress Notes (Signed)
1.  S/W  MICHAEL @ [facility] # [phone]    BRILINTA  90 MG BID   COVER- YES  CO-PAY- $ 174.16  TIER- 3 DRUG  PRIOR APPROVAL- NO   DEDUCTIBLE: DON'T HAVE A DEDUCTIBLE   PREFERRED PHARMACY : [facility]

## 2017-03-18 ENCOUNTER — Telehealth (HOSPITAL_COMMUNITY): Payer: Self-pay

## 2017-03-18 NOTE — Telephone Encounter (Signed)
Verified Medicare Part B insurance. No co-payment, deductible amount is $183.00/$183.00 has been met, no out of pocket, 20% co-insurance, and no pre-authorization required. Passport/reference # [id number]  Verified [employer]. No co-payment, deductible amount of $350.00/$0 has been met, out of pocket amount is $5,000/$232.80 has been met, 15% co-insurance, and no pre-authorization is required. Passport/reference # [id number]

## 2017-03-19 ENCOUNTER — Telehealth: Payer: Self-pay | Admitting: Interventional Cardiology

## 2017-03-19 NOTE — Telephone Encounter (Signed)
Spoke with pt and she had stent placed 10/4 and wanted to know if ok to still have teeth cleaned at dental appt coming up.  Spoke with Dr. Irish Lack, [place] and he said this fine.  Pt aware and appreciative for call.

## 2017-03-19 NOTE — Telephone Encounter (Signed)
New message    Pt is calling to find out if she needs to reschedule her dentist appt since she just had stents placed.

## 2017-03-25 NOTE — Progress Notes (Signed)
Cardiology Office Note    Date:  03/28/2017   ID:  Sherry Barnett, Sherry Barnett February 17, 1933, MRN [id number]  PCP:  Lajean Manes, MD  Cardiologist:  Dr. Tamala Julian  Chief Complaint: Hospital follow-up after cath  History of Present Illness:   Sherry Barnett is a 83 y.o. female history of paroxysmal atrial fibrillation remotely, chronic kidney disease stage II-III, hypercholesterolemia Presents for hospital follow-up.  Seen by Dr. Tamala Julian 02/26/17 to established care for new onset angina x 8 weeks. Pending 30 day monitor for evaluation of afib. Follow-up stress test low risk however Baseline EKG showed NSR with < 0.71mm ST elevation in V2. Recommended continue medical therapy. However, patient continued to have exertional chest pain with shortness of breath during my evaluation 03/13/2017. This is limiting her activity. Recommended cardiac cath for definitive evaluation. Cath showed 60% proximal and 90-95% mid [name] stenosis s/p successful PCI/DES to p/mLAD. Plan for dual antiplatelet therapy with aspirin and Brillinta for at least 6 months. Medical therapy for moderate disease. Lipitor increased to 80 mg.  Patient is here for follow-up. Patient has intermittent episode of shortness of breath with and without exertion. No chest pain. Has intermittent palpitations lasting just for seconds. States has sob with laying on left side but improves with slight change in position. Noted acid reflux on new medications.    Past Medical History:  Diagnosis Date  . A-fib ([place])   . Bilateral cataracts   . Blepharitis    chronic  . CAD (coronary artery disease), native coronary artery    10/18 PCI/DES to p/mLAD, normal EF  . Cardiovascular risk factor    23%  . Degenerative joint disease (DJD) of lumbar spine   . Diverticulosis   . Endometriosis   . Fibroids   . Hematuria, microscopic F120055  . Hemorrhoids   . Hypercholesterolemia    10 years  . Osteopenia 03/2016   start alendrodate  . Renal disease    stage2/stage 3    Past Surgical History:  Procedure Laterality Date  . APPENDECTOMY    . CATARACT EXTRACTION, BILATERAL Bilateral 2001 & 11/2016  . CORONARY STENT INTERVENTION N/A 03/14/2017   Procedure: CORONARY STENT INTERVENTION;  Surgeon: Belva Crome, MD;  Location: [facility];  Service: Cardiovascular;  Laterality: N/A;  . LEFT HEART CATH AND CORONARY ANGIOGRAPHY N/A 03/14/2017   Procedure: LEFT HEART CATH AND CORONARY ANGIOGRAPHY;  Surgeon: Belva Crome, MD;  Location: [facility];  Service: Cardiovascular;  Laterality: N/A;  . ovarian cysts Bilateral   . tubes removal Bilateral    endometriosis    Current Medications: Prior to Admission medications   Medication Sig Start Date End Date Taking? Authorizing Provider  alendronate (FOSAMAX) 70 MG tablet Take 70 mg by mouth every Saturday. Take with a full glass of water on an empty stomach.     [provider]  aspirin EC 81 MG tablet Take 1 tablet (81 mg total) by mouth daily. Patient taking differently: Take 81 mg by mouth daily with breakfast.  02/26/17   Belva Crome, MD  atorvastatin (LIPITOR) 80 MG tablet Take 1 tablet (80 mg total) by mouth daily at 6 PM. 03/15/17   Cheryln Manly, NP  Azelaic Acid (FINACEA) 15 % cream Apply 1 application topically at bedtime. After skin is thoroughly washed and patted dry, gently but thoroughly massage a thin film of azelaic acid cream into the affected area twice daily, in the morning and evening.  [provider]  cholecalciferol (VITAMIN D) 1000 units tablet Take 1,000 Units by mouth daily.    [provider]  metoprolol succinate (TOPROL XL) 25 MG 24 hr tablet Take 1 tablet (25 mg total) by mouth daily. Patient taking differently: Take 25 mg by mouth daily with breakfast.  02/26/17   Belva Crome, MD  nitroGLYCERIN (NITROSTAT) 0.4 MG SL tablet Place 1 tablet (0.4 mg total) under the tongue every 5 (five) minutes as needed for chest pain.  02/26/17 05/27/17  Belva Crome, MD  ticagrelor (BRILINTA) 90 MG TABS tablet Take 1 tablet (90 mg total) by mouth 2 (two) times daily. 03/15/17   Cheryln Manly, NP  tobramycin-dexamethasone [facility]) ophthalmic ointment Place 1 application into both eyes See admin instructions. Tuesday AND Saturday patient does not use. Will apply at night on all other nights.    [provider]    Allergies:   Sulfa antibiotics; Novocain [procaine]; and Penicillins   Social History   Social History  . Marital status: Single    Spouse name: N/A  . Number of children: N/A  . Years of education: N/A   Social History Main Topics  . Smoking status: Former Smoker    Years: 5.00    Types: Cigarettes    Quit date: 02/06/1969  . Smokeless tobacco: Never Used  . Alcohol use No  . Drug use: No  . Sexual activity: Not on file     Comment: MARRIED   Other Topics Concern  . Not on file   Social History Narrative  . No narrative on file     Family History:  The patient's family history includes Alzheimer's disease in her father; Heart attack in her mother; Hypertension in her father; Other (age of onset: 36) in her sister; Stroke in her father.   ROS:   Please see the history of present illness.    ROS All other systems reviewed and are negative.   PHYSICAL EXAM:   VS:  BP (!) 110/58 (BP Location: Left Arm, Patient Position: Sitting, Cuff Size: Normal)   Pulse 70   Ht 5\' 3"  (1.6 m)   Wt 138 lb 12.8 oz (63 kg)   BMI 24.59 kg/m    GEN: Well nourished, well developed, in no acute distress  HEENT: normal  Neck: no JVD, carotid bruits, or masses Cardiac: RRR; no murmurs, rubs, or gallops,no edema  Respiratory:  clear to auscultation bilaterally, normal work of breathing GI: soft, nontender, nondistended, + BS MS: no deformity or atrophy  Skin: warm and dry, no rash Neuro:  Alert and Oriented x 3, Strength and sensation are [phone] Psych: euthymic mood, full affect  Wt Readings  from Last 3 Encounters:  03/28/17 138 lb 12.8 oz (63 kg)  03/14/17 137 lb (62.1 kg)  03/13/17 139 lb (63 kg)      Studies/Labs Reviewed:   EKG:  EKG is ordered today.  The ekg ordered today demonstrates NSR  Recent Labs: 03/15/2017: BUN 17; Creatinine, Ser 0.97; Hemoglobin 11.7; Platelets 201; Potassium 3.8; Sodium 136   Lipid Panel No results found for: CHOL, TRIG, HDL, CHOLHDL, VLDL, LDLCALC, LDLDIRECT  Additional studies/ records that were reviewed today include:   Stress test 02/27/17  Nuclear stress EF: 68%.  Baseline EKG showed NSR with < 0.57mm ST elevation in V2  There was no ST segment deviation noted during stress from baseline EKG.  The study is normal.  This is a low risk study.  The left  ventricular ejection fraction is normal (55-65%).  CORONARY STENT INTERVENTION  03/14/17  LEFT HEART CATH AND CORONARY ANGIOGRAPHY  Conclusion     Dist Cx lesion, 50 %stenosed.    Normal left main  60% proximal and 90-95% mid [name] stenosis. Lesions are within 12 mm. The [name] is moderately tortuous.  50% ostial RCA.  50% distal circumflex before the origin of the dominant obtuse marginal branch.  Mid anterior wall hypokinesis. Estimated ejection fraction 50-55%. Normal filling pressures with EDP of 11 mmHg.  Successful stent of the proximal to mid [name] reducing the 60 and 95% tandem stenoses to 0% with TIMI grade 3 flow. 2.5 x 23 [place] and DES postdilated to 3.0 mm in diameter. Balloon inflations reproduced symptoms.  RECOMMENDATIONS:   Aggressive risk factor modification including high-intensity statin therapy.  Discharge in a.m.  Continue beta blocker.  Aspirin and Brilinta for at least 6 months.   Diagnostic Diagram       Post-Intervention Diagram             ASSESSMENT & PLAN:    1. CAD s/p DES to proximal to mid [name] - No chest pain. She has dyspnea likely due to [place]. Will review with Dr. Tamala Julian if ok to change to plavix. Continue  ASA, Statin and BB. Patient has a deep dyspnea while laying on left side. If no improvement after changing Plavix might need a echocardiogram. She will give [place] a call if no improvement. No fever, chills or congestion.   2. Hyperlipidemia - Continue Lipitor 80 mg daily. LFT and lipid check in 4 weeks.  3. Remote hx of Afib -No longer on monitor per Dr. Tamala Julian. Mild palpitations. Continue BB.   4. GERD - Start PPI.    Medication Adjustments/Labs and Tests Ordered: Current medicines are reviewed at length with the patient today.  Concerns regarding medicines are outlined above.  Medication changes, Labs and Tests ordered today are listed in the Patient Instructions below. There are no Patient Instructions on file for this visit.   Jarrett Soho, [place]  03/28/2017 8:40 AM    [address], [place], [place]  [address] Phone: [phone]; Fax: [phone]

## 2017-03-28 ENCOUNTER — Telehealth (HOSPITAL_COMMUNITY): Payer: Self-pay

## 2017-03-28 ENCOUNTER — Ambulatory Visit (INDEPENDENT_AMBULATORY_CARE_PROVIDER_SITE_OTHER): Payer: Medicare Other | Admitting: Physician Assistant

## 2017-03-28 VITALS — BP 110/58 | HR 70 | Ht 63.0 in | Wt 138.8 lb

## 2017-03-28 DIAGNOSIS — I480 Paroxysmal atrial fibrillation: Secondary | ICD-10-CM | POA: Diagnosis not present

## 2017-03-28 DIAGNOSIS — K219 Gastro-esophageal reflux disease without esophagitis: Secondary | ICD-10-CM | POA: Diagnosis not present

## 2017-03-28 DIAGNOSIS — I2510 Atherosclerotic heart disease of native coronary artery without angina pectoris: Secondary | ICD-10-CM

## 2017-03-28 DIAGNOSIS — E785 Hyperlipidemia, unspecified: Secondary | ICD-10-CM

## 2017-03-28 MED ORDER — PANTOPRAZOLE SODIUM 40 MG PO TBEC: 40.0000 mg | DELAYED_RELEASE_TABLET | Freq: Every day | ORAL | 6 refills | Status: DC

## 2017-03-28 NOTE — Patient Instructions (Signed)
Medication Instructions:  Your physician has recommended you make the following change in your medication:  1. Start Protonix (40 mg ) daily, this will help with your reflux.   Labwork: Your physician recommends that you return for a FASTING lipid profile: on November 26 between 8-5.   Testing/Procedures: -None  Follow-Up: Your physician recommends that you keep your scheduled follow-up appointment with Dr. Tamala Julian on January 15.   Any Other Special Instructions Will Be Listed Below (If Applicable).     If you need a refill on your cardiac medications before your next appointment, please call your pharmacy.

## 2017-03-28 NOTE — Telephone Encounter (Signed)
Spoke with patient. She is interested in Cardiac Rehab program. Scheduled orientation for 04/11/2017 at 8:30am. She will attend the 11:15am exercise class.

## 2017-03-29 NOTE — Telephone Encounter (Signed)
Patient called to verify orientation time. She also wanted to change her class time from 11:15am to 1:15pm.

## 2017-04-08 ENCOUNTER — Telehealth (HOSPITAL_COMMUNITY): Payer: Self-pay

## 2017-04-11 ENCOUNTER — Other Ambulatory Visit (HOSPITAL_COMMUNITY): Payer: Medicare Other

## 2017-04-11 ENCOUNTER — Telehealth: Payer: Self-pay | Admitting: Physician Assistant

## 2017-04-11 ENCOUNTER — Other Ambulatory Visit: Payer: Self-pay

## 2017-04-11 ENCOUNTER — Encounter (HOSPITAL_COMMUNITY)
Admission: RE | Admit: 2017-04-11 | Discharge: 2017-04-11 | Disposition: A | Discharge status: Home / Self Care | Payer: Medicare Other | Source: Ambulatory Visit | Attending: Interventional Cardiology | Admitting: Interventional Cardiology

## 2017-04-11 ENCOUNTER — Encounter (HOSPITAL_COMMUNITY): Payer: Self-pay

## 2017-04-11 ENCOUNTER — Ambulatory Visit (HOSPITAL_COMMUNITY)
Admission: RE | Admit: 2017-04-11 | Discharge: 2017-04-11 | Disposition: A | Discharge status: Home / Self Care | Payer: Medicare Other | Source: Ambulatory Visit | Attending: Interventional Cardiology | Admitting: Interventional Cardiology

## 2017-04-11 VITALS — BP 112/80 | HR 69 | Ht 62.25 in | Wt 139.3 lb

## 2017-04-11 DIAGNOSIS — N183 Chronic kidney disease, stage 3 (moderate): Secondary | ICD-10-CM | POA: Diagnosis not present

## 2017-04-11 DIAGNOSIS — M8580 Other specified disorders of bone density and structure, unspecified site: Secondary | ICD-10-CM | POA: Insufficient documentation

## 2017-04-11 DIAGNOSIS — R079 Chest pain, unspecified: Secondary | ICD-10-CM | POA: Diagnosis not present

## 2017-04-11 DIAGNOSIS — I4891 Unspecified atrial fibrillation: Secondary | ICD-10-CM | POA: Insufficient documentation

## 2017-04-11 DIAGNOSIS — I2510 Atherosclerotic heart disease of native coronary artery without angina pectoris: Secondary | ICD-10-CM | POA: Insufficient documentation

## 2017-04-11 DIAGNOSIS — R9431 Abnormal electrocardiogram [ECG] [EKG]: Secondary | ICD-10-CM | POA: Insufficient documentation

## 2017-04-11 DIAGNOSIS — Z951 Presence of aortocoronary bypass graft: Secondary | ICD-10-CM | POA: Diagnosis not present

## 2017-04-11 DIAGNOSIS — Z955 Presence of coronary angioplasty implant and graft: Secondary | ICD-10-CM

## 2017-04-11 DIAGNOSIS — Z87891 Personal history of nicotine dependence: Secondary | ICD-10-CM | POA: Insufficient documentation

## 2017-04-11 DIAGNOSIS — Z79899 Other long term (current) drug therapy: Secondary | ICD-10-CM | POA: Diagnosis not present

## 2017-04-11 MED ORDER — ISOSORBIDE MONONITRATE ER 30 MG PO TB24: 30.0000 mg | ORAL_TABLET | Freq: Every day | ORAL | 3 refills | Status: DC

## 2017-04-11 NOTE — Progress Notes (Signed)
Sherry Barnett 83 y.o. female DOB: Feb 03, 1933 MRN: [id number]      Nutrition Note  1. S/P drug eluting coronary stent placement    Past Medical History:  Diagnosis Date  . A-fib ([place])   . Bilateral cataracts   . Blepharitis    chronic  . CAD (coronary artery disease), native coronary artery    10/18 PCI/DES to p/mLAD, normal EF  . Cardiovascular risk factor    23%  . Degenerative joint disease (DJD) of lumbar spine   . Diverticulosis   . Endometriosis   . Fibroids   . Hematuria, microscopic F120055  . Hemorrhoids   . Hypercholesterolemia    10 years  . Osteopenia 03/2016   start alendrodate  . Renal disease    stage2/stage 3   Meds reviewed.   HT: Ht Readings from Last 1 Encounters:  04/11/17 5' 2.25" (1.581 m)    WT: Wt Readings from Last 3 Encounters:  04/11/17 139 lb 5.3 oz (63.2 kg)  03/28/17 138 lb 12.8 oz (63 kg)  03/14/17 137 lb (62.1 kg)     BMI 25.3   Current tobacco use? No  Labs:  Lipid Panel  No results found for: CHOL, TRIG, HDL, CHOLHDL, VLDL, LDLCALC, LDLDIRECT  No results found for: HGBA1C CBG (last 3)  No results for input(s): GLUCAP in the last 72 hours.  Nutrition Note Spoke with pt and pt's husband. Nutrition plan and goals reviewed with pt. Pt is following Step 2 of the Therapeutic Lifestyle Changes diet. Age-appropriate nutrition recommendations discussed. Pt expressed understanding of the information reviewed. Pt aware of nutrition education classes offered.  Nutrition Diagnosis Food-and nutrition-related knowledge deficit related to lack of exposure to information as related to diagnosis of: ? CVD   Nutrition Intervention ? Pt's individual nutrition plan and goals reviewed with pt.  Nutrition Goal(s):  ? Pt to maintain her current wt while in Cardiac Rehab  Plan:  Pt to attend nutrition classes ? Nutrition I ? Nutrition II ? Portion Distortion  Will provide client-centered nutrition education as part of interdisciplinary care.    Monitor and evaluate progress toward nutrition goal with team.  Derek Mound, M.Ed, RD, LDN, CDE 04/11/2017 11:15 AM

## 2017-04-11 NOTE — Progress Notes (Signed)
Patient here for orientation. During walk test patient experienced chest pressure  at 4 minutes 54 seconds. Exercise walk test  stopped. Blood pressure 128/80. Heart rate 90. Oxygen saturation 99% on room. Telemetry rhythm Sinus with arrhythmia. Ms Brzozowski said the discomfort went away with rest. Ms Morello described the discomfort a 4-5/10 with radiation to bilateral jaws. 12 lead ECG obtained. Vin Bhagat PA paged and notified. Vin came to cardiac rehab and evaluated [name]. After talking with Dr Burt Knack, Vin added Imdur 30 mg once  Day. Vin said that Mrs Cheeks may exercise at cardiac rehab. The patient was instructed to call the office if she experiences any more chest pressure or jaw pain. The patient states understanding. [name] is scheduled to begin exercise at cardiac rehab on 05/22/17. [name] did not have any further chest discomfort upon exit from cardiac rehab.Will continue to monitor the patient throughout  the program.Sherry Venetia Maxon, RN,BSN 04/11/2017 12:03 PM

## 2017-04-11 NOTE — Telephone Encounter (Signed)
Called form cardiac rehab. Patient had substernal chest pressure radiating to bilateral jaw after 4 laps. Resolved with rest in few minutes. She continues to have SOB but improving. Intermittent chest discomfort.   Reviewed with Dr. Burt Knack. Add Imdur 30mg , lower METS during cardiac rehab and take Brillnta with coffee. She will give [place] call if no improvement. Pending monitor result.

## 2017-04-11 NOTE — Progress Notes (Signed)
Cardiac Individual Treatment Plan  Patient Details  Name: Sherry Barnett MRN: [id number] Date of Birth: 1932-06-24 Referring Provider:     CARDIAC REHAB PHASE II ORIENTATION from 04/11/2017 in [place]  Referring Provider  Daneen Schick MD      Initial Encounter Date:    CARDIAC REHAB PHASE II ORIENTATION from 04/11/2017 in [place]  Date  04/11/17  Referring Provider  Daneen Schick MD      Visit Diagnosis: S/P drug eluting coronary stent placement  Patient's Home Medications on Admission:  Current Outpatient Prescriptions:  .  alendronate (FOSAMAX) 70 MG tablet, Take 70 mg by mouth every Saturday. Take with a full glass of water on an empty stomach. , Disp: , Rfl:  .  Artificial Tear Ointment (ARTIFICIAL TEARS) ointment, Place into both eyes as needed., Disp: , Rfl:  .  aspirin EC 81 MG tablet, Take 1 tablet (81 mg total) by mouth daily. (Patient taking differently: Take 81 mg by mouth daily with breakfast. ), Disp: 90 tablet, Rfl: 3 .  atorvastatin (LIPITOR) 80 MG tablet, Take 1 tablet (80 mg total) by mouth daily at 6 PM., Disp: 30 tablet, Rfl: 2 .  Azelaic Acid (FINACEA) 15 % cream, Apply 1 application topically at bedtime. After skin is thoroughly washed and patted dry, gently but thoroughly massage a thin film of azelaic acid cream into the affected area twice daily, in the morning and evening. , Disp: , Rfl:  .  cholecalciferol (VITAMIN D) 1000 units tablet, Take 1,000 Units by mouth daily., Disp: , Rfl:  .  metoprolol succinate (TOPROL XL) 25 MG 24 hr tablet, Take 1 tablet (25 mg total) by mouth daily. (Patient taking differently: Take 25 mg by mouth daily with breakfast. ), Disp: 90 tablet, Rfl: 3 .  nitroGLYCERIN (NITROSTAT) 0.4 MG SL tablet, Place 1 tablet (0.4 mg total) under the tongue every 5 (five) minutes as needed for chest pain., Disp: 25 tablet, Rfl: 3 .  pantoprazole (PROTONIX) 40 MG tablet, Take 1 tablet  (40 mg total) by mouth daily., Disp: 30 tablet, Rfl: 6 .  ticagrelor (BRILINTA) 90 MG TABS tablet, Take 1 tablet (90 mg total) by mouth 2 (two) times daily., Disp: 180 tablet, Rfl: 1 .  tobramycin-dexamethasone (TOBRADEX) ophthalmic ointment, Place 1 application into both eyes See admin instructions. Tuesday AND Saturday patient does not use. Will apply at night on all other nights., Disp: , Rfl:  .  calcium carbonate (TUMS EX) 750 MG chewable tablet, Chew 1 tablet by mouth daily., Disp: , Rfl:  .  isosorbide mononitrate (IMDUR) 30 MG 24 hr tablet, Take 1 tablet (30 mg total) by mouth daily., Disp: 90 tablet, Rfl: 3  Past Medical History: Past Medical History:  Diagnosis Date  . A-fib ([place])   . Bilateral cataracts   . Blepharitis    chronic  . CAD (coronary artery disease), native coronary artery    10/18 PCI/DES to p/mLAD, normal EF  . Cardiovascular risk factor    23%  . Degenerative joint disease (DJD) of lumbar spine   . Diverticulosis   . Endometriosis   . Fibroids   . Hematuria, microscopic [id number]  . Hemorrhoids   . Hypercholesterolemia    10 years  . Osteopenia 03/2016   start alendrodate  . Renal disease    stage2/stage 3    Tobacco Use: History  Smoking Status  . Former Smoker  . Years: 5.00  . Types:  Cigarettes  . Quit date: 02/06/1969  Smokeless Tobacco  . Never Used    Labs: Recent Review Flowsheet Data    There is no flowsheet data to display.      Capillary Blood Glucose: No results found for: GLUCAP   Exercise Target Goals: Date: 04/11/17  Exercise Program Goal: Individual exercise prescription set with THRR, safety & activity barriers. Participant demonstrates ability to understand and report RPE using BORG scale, to self-measure pulse accurately, and to acknowledge the importance of the exercise prescription.  Exercise Prescription Goal: Starting with aerobic activity 30 plus minutes a day, 3 days per week for initial exercise prescription.  Provide home exercise prescription and guidelines that participant acknowledges understanding prior to discharge.  Activity Barriers & Risk Stratification:     Activity Barriers & Cardiac Risk Stratification - 04/11/17 1157      Activity Barriers & Cardiac Risk Stratification   Cardiac Risk Stratification Moderate      6 Minute Walk:     6 Minute Walk    Row Name 04/11/17 1141 04/11/17 1208       6 Minute Walk   Phase Initial  -    Distance 733 feet  -    Walk Time 4.54 minutes  -    # of Rest Breaks 0  -    MPH 1.8  -    METS 1.1  -    VO2 Peak 3.87  -    Symptoms Yes (comment)  -    Comments chest pressure radiating to bilateral jaws; SOB chest pressure radiating to bilateral jaws; SOB. Test terminated at 4:54. Pt reported 4-5/10 for chest pain/discomfort    Resting HR 69 bpm  -    Resting BP 112/80  -    Resting Oxygen Saturation  97 %  -    Exercise Oxygen Saturation  during 6 min walk 99 %  -    Max Ex. HR 90 bpm  -    Max Ex. BP 128/84  -    2 Minute Post BP 130/51  -       Oxygen Initial Assessment:   Oxygen Re-Evaluation:   Oxygen Discharge (Final Oxygen Re-Evaluation):   Initial Exercise Prescription:     Initial Exercise Prescription - 04/11/17 1100      Date of Initial Exercise RX and Referring Provider   Date 04/11/17   Referring Provider Daneen Schick MD     Recumbant Bike   Level 1.5   Watts 10   Minutes 5   METs 2.24     NuStep   Level 2   SPM 75   Minutes 10   METs 2     Track   Laps 6   Minutes 10   METs 2.03     Prescription Details   Frequency (times per week) 3   Duration Progress to 30 minutes of continuous aerobic without signs/symptoms of physical distress     Intensity   THRR 40-80% of Max Heartrate 55-110   Ratings of Perceived Exertion 11-13   Perceived Dyspnea 0-4     Progression   Progression Continue to progress workloads to maintain intensity without signs/symptoms of physical distress.     Resistance  Training   Training Prescription Yes   Weight 2lbs   Reps 10-15      Perform Capillary Blood Glucose checks as needed.  Exercise Prescription Changes:   Exercise Comments:   Exercise Goals and Review:     Exercise Goals  [place] Name 04/11/17 1014             Exercise Goals   Increase Physical Activity Yes       Intervention Provide advice, education, support and counseling about physical activity/exercise needs.;Develop an individualized exercise prescription for aerobic and resistive training based on initial evaluation findings, risk stratification, comorbidities and participant's personal goals.       Expected Outcomes Achievement of increased cardiorespiratory fitness and enhanced flexibility, muscular endurance and strength shown through measurements of functional capacity and personal statement of participant.       Increase Strength and Stamina Yes  Be able to exercise and walk long distances without SOB/CP       Intervention Develop an individualized exercise prescription for aerobic and resistive training based on initial evaluation findings, risk stratification, comorbidities and participant's personal goals.;Provide advice, education, support and counseling about physical activity/exercise needs.       Expected Outcomes Achievement of increased cardiorespiratory fitness and enhanced flexibility, muscular endurance and strength shown through measurements of functional capacity and personal statement of participant.       Able to understand and use rate of perceived exertion (RPE) scale Yes       Intervention Provide education and explanation on how to use RPE scale       Expected Outcomes Short Term: Able to use RPE daily in rehab to express subjective intensity level;Long Term:  Able to use RPE to guide intensity level when exercising independently       Knowledge and understanding of Target Heart Rate Range (THRR) Yes       Intervention Provide education and  explanation of THRR including how the numbers were predicted and where they are located for reference       Expected Outcomes Short Term: Able to state/look up THRR;Long Term: Able to use THRR to govern intensity when exercising independently;Short Term: Able to use daily as guideline for intensity in rehab       Able to check pulse independently Yes       Intervention Provide education and demonstration on how to check pulse in carotid and radial arteries.;Review the importance of being able to check your own pulse for safety during independent exercise       Expected Outcomes Short Term: Able to explain why pulse checking is important during independent exercise;Long Term: Able to check pulse independently and accurately       Understanding of Exercise Prescription Yes       Intervention Provide education, explanation, and written materials on patient's individual exercise prescription       Expected Outcomes Short Term: Able to explain program exercise prescription;Long Term: Able to explain home exercise prescription to exercise independently          Exercise Goals Re-Evaluation :    Discharge Exercise Prescription (Final Exercise Prescription Changes):   Nutrition:  Target Goals: Understanding of nutrition guidelines, daily intake of sodium 1500mg , cholesterol 200mg , calories 30% from fat and 7% or less from saturated fats, daily to have 5 or more servings of fruits and vegetables.  Biometrics:    Nutrition Therapy Plan and Nutrition Goals:     Nutrition Therapy & Goals - 04/11/17 1113      Nutrition Therapy   Diet Therapeutic Lifestyle Changes     Personal Nutrition Goals   Nutrition Goal Pt to maintain her current wt while in Cardiac Rehab.      Intervention Plan   Intervention Prescribe, educate and counsel regarding individualized  specific dietary modifications aiming towards targeted core components such as weight, hypertension, lipid management, diabetes, heart  failure and other comorbidities.   Expected Outcomes Short Term Goal: Understand basic principles of dietary content, such as calories, fat, sodium, cholesterol and nutrients.;Long Term Goal: Adherence to prescribed nutrition plan.      Nutrition Discharge: Nutrition Scores:     Nutrition Assessments - 04/11/17 1113      MEDFICTS Scores   Pre Score 31      Nutrition Goals Re-Evaluation:   Nutrition Goals Re-Evaluation:   Nutrition Goals Discharge (Final Nutrition Goals Re-Evaluation):   Psychosocial: Target Goals: Acknowledge presence or absence of significant depression and/or stress, maximize coping skills, provide positive support system. Participant is able to verbalize types and ability to use techniques and skills needed for reducing stress and depression.  Initial Review & Psychosocial Screening:     Initial Psych Review & Screening - 04/11/17 1245      Initial Review   Current issues with None Identified     Family Dynamics   Good Support System? Yes   Comments Pt husband, former participant in cardiac rehab is supportive of pt rehab      Barriers   Psychosocial barriers to participate in program The patient should benefit from training in stress management and relaxation.     Screening Interventions   Interventions Encouraged to exercise      Quality of Life Scores:     Quality of Life - 04/11/17 1152      Quality of Life Scores   Health/Function Pre 27.1 %   Socioeconomic Pre 28.07 %   Psych/Spiritual Pre 29.14 %   Family Pre 27.6 %   GLOBAL Pre 27.79 %      PHQ-9: Recent Review Flowsheet Data    There is no flowsheet data to display.     Interpretation of Total Score  Total Score Depression Severity:  1-4 = Minimal depression, 5-9 = Mild depression, 10-14 = Moderate depression, 15-19 = Moderately severe depression, 20-27 = Severe depression   Psychosocial Evaluation and Intervention:   Psychosocial Re-Evaluation:   Psychosocial  Discharge (Final Psychosocial Re-Evaluation):   Vocational Rehabilitation: Provide vocational rehab assistance to qualifying candidates.   Vocational Rehab Evaluation & Intervention:     Vocational Rehab - 04/11/17 1246      Initial Vocational Rehab Evaluation & Intervention   Assessment shows need for Vocational Rehabilitation No      Education: Education Goals: Education classes will be provided on a weekly basis, covering required topics. Participant will state understanding/return demonstration of topics presented.  Learning Barriers/Preferences:     Learning Barriers/Preferences - 04/11/17 1013      Learning Barriers/Preferences   Learning Barriers Sight   Learning Preferences Written Material;Video;Pictoral      Education Topics: Count Your Pulse:  -Group instruction provided by verbal instruction, demonstration, patient participation and written materials to support subject.  Instructors address importance of being able to find your pulse and how to count your pulse when at home without a heart monitor.  Patients get hands on experience counting their pulse with staff help and individually.   Heart Attack, Angina, and Risk Factor Modification:  -Group instruction provided by verbal instruction, video, and written materials to support subject.  Instructors address signs and symptoms of angina and heart attacks.    Also discuss risk factors for heart disease and how to make changes to improve heart health risk factors.   Functional Fitness:  -Group instruction provided  by verbal instruction, demonstration, patient participation, and written materials to support subject.  Instructors address safety measures for doing things around the house.  Discuss how to get up and down off the floor, how to pick things up properly, how to safely get out of a chair without assistance, and balance training.   Meditation and Mindfulness:  -Group instruction provided by verbal  instruction, patient participation, and written materials to support subject.  Instructor addresses importance of mindfulness and meditation practice to help reduce stress and improve awareness.  Instructor also leads participants through a meditation exercise.    Stretching for Flexibility and Mobility:  -Group instruction provided by verbal instruction, patient participation, and written materials to support subject.  Instructors lead participants through series of stretches that are designed to increase flexibility thus improving mobility.  These stretches are additional exercise for major muscle groups that are typically performed during regular warm up and cool down.   Hands Only CPR:  -Group verbal, video, and participation provides a basic overview of AHA guidelines for community CPR. Role-play of emergencies allow participants the opportunity to practice calling for help and chest compression technique with discussion of AED use.   Hypertension: -Group verbal and written instruction that provides a basic overview of hypertension including the most recent diagnostic guidelines, risk factor reduction with self-care instructions and medication management.    Nutrition I class: Heart Healthy Eating:  -Group instruction provided by PowerPoint slides, verbal discussion, and written materials to support subject matter. The instructor gives an explanation and review of the Therapeutic Lifestyle Changes diet recommendations, which includes a discussion on lipid goals, dietary fat, sodium, fiber, plant stanol/sterol esters, sugar, and the components of a well-balanced, healthy diet.   Nutrition II class: Lifestyle Skills:  -Group instruction provided by PowerPoint slides, verbal discussion, and written materials to support subject matter. The instructor gives an explanation and review of label reading, grocery shopping for heart health, heart healthy recipe modifications, and ways to make  healthier choices when eating out.   Diabetes Question & Answer:  -Group instruction provided by PowerPoint slides, verbal discussion, and written materials to support subject matter. The instructor gives an explanation and review of diabetes co-morbidities, pre- and post-prandial blood glucose goals, pre-exercise blood glucose goals, signs, symptoms, and treatment of hypoglycemia and hyperglycemia, and foot care basics.   Diabetes Blitz:  -Group instruction provided by PowerPoint slides, verbal discussion, and written materials to support subject matter. The instructor gives an explanation and review of the physiology behind type 1 and type 2 diabetes, diabetes medications and rational behind using different medications, pre- and post-prandial blood glucose recommendations and Hemoglobin A1c goals, diabetes diet, and exercise including blood glucose guidelines for exercising safely.    Portion Distortion:  -Group instruction provided by PowerPoint slides, verbal discussion, written materials, and food models to support subject matter. The instructor gives an explanation of serving size versus portion size, changes in portions sizes over the last 20 years, and what consists of a serving from each food group.   Stress Management:  -Group instruction provided by verbal instruction, video, and written materials to support subject matter.  Instructors review role of stress in heart disease and how to cope with stress positively.     Exercising on Your Own:  -Group instruction provided by verbal instruction, power point, and written materials to support subject.  Instructors discuss benefits of exercise, components of exercise, frequency and intensity of exercise, and end points for exercise.  Also discuss use  of nitroglycerin and activating EMS.  Review options of places to exercise outside of rehab.  Review guidelines for sex with heart disease.   Cardiac Drugs I:  -Group instruction provided by  verbal instruction and written materials to support subject.  Instructor reviews cardiac drug classes: antiplatelets, anticoagulants, beta blockers, and statins.  Instructor discusses reasons, side effects, and lifestyle considerations for each drug class.   Cardiac Drugs II:  -Group instruction provided by verbal instruction and written materials to support subject.  Instructor reviews cardiac drug classes: angiotensin converting enzyme inhibitors (ACE-I), angiotensin II receptor blockers (ARBs), nitrates, and calcium channel blockers.  Instructor discusses reasons, side effects, and lifestyle considerations for each drug class.   Anatomy and Physiology of the Circulatory System:  Group verbal and written instruction and models provide basic cardiac anatomy and physiology, with the coronary electrical and arterial systems. Review of: AMI, Angina, Valve disease, Heart Failure, Peripheral Artery Disease, Cardiac Arrhythmia, Pacemakers, and the ICD.   Other Education:  -Group or individual verbal, written, or video instructions that support the educational goals of the cardiac rehab program.   Knowledge Questionnaire Score:     Knowledge Questionnaire Score - 04/11/17 1149      Knowledge Questionnaire Score   Pre Score 19/24      Core Components/Risk Factors/Patient Goals at Admission:     Personal Goals and Risk Factors at Admission - 04/11/17 1155      Core Components/Risk Factors/Patient Goals on Admission   Admit Weight 139 lb 5.3 oz (63.2 kg)   Goal Weight: Short Term 139 lb (63 kg)   Goal Weight: Long Term 139 lb (63 kg)      Core Components/Risk Factors/Patient Goals Review:    Core Components/Risk Factors/Patient Goals at Discharge (Final Review):    ITP Comments:     ITP Comments    Row Name 04/11/17 0932           ITP Comments Dr Fransico Him, Medical Director          Comments:  Patient attended orientation from 0800 to 1130 to review rules and  guidelines for program. Completed 6 minute walk test, Intitial ITP, and exercise prescription.  VSS. Telemetry-SR with Sinus arrythmias.  Pt experienced chest pressure that radiated to her jaw. Pt rated this a 4-5/10.  Discomfort resolved immediately with rest.  12 lead ekg obtained and reviewed by "Vin" Plan to add Imdur to pt medication regimen.  Pt asked to hold on starting cardiac rehab for one week.  Brief Psychosocial assessment reveal no immediate barriers to participating in cardiac rehab.  Pt has supportive husband who accompanied her to orientation today.  Pt husband is a former patient.  Pt is eager to get started with exercise and has a delightful disposition. Cherre Huger, BSN Cardiac and Training and development officer

## 2017-04-15 ENCOUNTER — Encounter (HOSPITAL_COMMUNITY): Payer: Medicare Other

## 2017-04-17 ENCOUNTER — Encounter (HOSPITAL_COMMUNITY): Payer: Medicare Other

## 2017-04-19 ENCOUNTER — Encounter (HOSPITAL_COMMUNITY): Payer: Medicare Other

## 2017-04-22 ENCOUNTER — Encounter (HOSPITAL_COMMUNITY): Payer: Medicare Other

## 2017-04-22 ENCOUNTER — Encounter (HOSPITAL_COMMUNITY): Payer: Self-pay

## 2017-04-22 ENCOUNTER — Encounter (HOSPITAL_COMMUNITY)
Admission: RE | Admit: 2017-04-22 | Discharge: 2017-04-22 | Disposition: A | Discharge status: Home / Self Care | Payer: Medicare Other | Source: Ambulatory Visit | Attending: Interventional Cardiology | Admitting: Interventional Cardiology

## 2017-04-22 DIAGNOSIS — Z955 Presence of coronary angioplasty implant and graft: Secondary | ICD-10-CM

## 2017-04-22 DIAGNOSIS — I4891 Unspecified atrial fibrillation: Secondary | ICD-10-CM | POA: Diagnosis not present

## 2017-04-22 DIAGNOSIS — M8580 Other specified disorders of bone density and structure, unspecified site: Secondary | ICD-10-CM | POA: Diagnosis not present

## 2017-04-22 DIAGNOSIS — I2510 Atherosclerotic heart disease of native coronary artery without angina pectoris: Secondary | ICD-10-CM | POA: Diagnosis not present

## 2017-04-22 DIAGNOSIS — Z951 Presence of aortocoronary bypass graft: Secondary | ICD-10-CM | POA: Diagnosis not present

## 2017-04-22 DIAGNOSIS — N183 Chronic kidney disease, stage 3 (moderate): Secondary | ICD-10-CM | POA: Diagnosis not present

## 2017-04-22 DIAGNOSIS — Z79899 Other long term (current) drug therapy: Secondary | ICD-10-CM | POA: Diagnosis not present

## 2017-04-22 NOTE — Progress Notes (Signed)
Daily Session Note  Patient Details  Name: Sherry Barnett MRN: [id number] Date of Birth: 1933/03/08 Referring Provider:     CARDIAC REHAB PHASE II ORIENTATION from 04/11/2017 in [place]  Referring Provider  Daneen Schick MD      Encounter Date: 04/22/2017  Check In: Session Check In - 04/22/17 0814      Check-In   Location  [facility]    Staff Present  Andi Hence, RN, Deland Pretty, MS, ACSM CEP, Exercise Physiologist;Molly diVincenzo, MS, ACSM RCEP, Exercise Physiologist;Maria Venetia Maxon, RN, BSN    Supervising physician immediately available to respond to emergencies  [facility] immediately available    Physician(s)  Dr. Maylene Roes    Medication changes reported      No    Fall or balance concerns reported     No    Tobacco Cessation  No Change    Warm-up and Cool-down  Performed as group-led instruction    Resistance Training Performed  Yes    VAD Patient?  No      Pain Assessment   Currently in Pain?  No/denies    Multiple Pain Sites  No       Capillary Blood Glucose: No results found for this or any previous visit (from the past 24 hour(s)).    Social History   Tobacco Use  Smoking Status Former Smoker  . Years: 5.00  . Types: Cigarettes  . Last attempt to quit: 02/06/1969  . Years since quitting: 48.2  Smokeless Tobacco Never Used    Goals Met:  Exercise tolerated well  Goals Unmet:  Not Applicable  Comments: Pt started cardiac rehab today.  Pt tolerated light exercise without difficulty. VSS, telemetry-sinus rhythm,  asymptomatic.  Medication list reconciled. Pt denies barriers to medicaiton compliance.  PSYCHOSOCIAL ASSESSMENT:  PHQ-0. Marland Kitchen Pt exhibits positive coping skills, hopeful outlook with supportive family. No psychosocial needs identified at this time, no psychosocial interventions necessary.    Pt enjoys spending time with great granddaughter, playing piano reading and exercising with her  husband. Pt goals for cardiac rehab are to increase exertional ability with less angina.    Pt oriented to exercise equipment and routine.    Understanding verbalized.   Dr. Fransico Him is Medical Director for Cardiac Rehab at [facility].

## 2017-04-24 ENCOUNTER — Encounter (HOSPITAL_COMMUNITY): Payer: Medicare Other

## 2017-04-24 ENCOUNTER — Encounter (HOSPITAL_COMMUNITY)
Admission: RE | Admit: 2017-04-24 | Discharge: 2017-04-24 | Disposition: A | Discharge status: Home / Self Care | Payer: Medicare Other | Source: Ambulatory Visit | Attending: Interventional Cardiology | Admitting: Interventional Cardiology

## 2017-04-24 DIAGNOSIS — Z79899 Other long term (current) drug therapy: Secondary | ICD-10-CM | POA: Diagnosis not present

## 2017-04-24 DIAGNOSIS — M8580 Other specified disorders of bone density and structure, unspecified site: Secondary | ICD-10-CM | POA: Diagnosis not present

## 2017-04-24 DIAGNOSIS — Z955 Presence of coronary angioplasty implant and graft: Secondary | ICD-10-CM

## 2017-04-24 DIAGNOSIS — I4891 Unspecified atrial fibrillation: Secondary | ICD-10-CM | POA: Diagnosis not present

## 2017-04-24 DIAGNOSIS — Z951 Presence of aortocoronary bypass graft: Secondary | ICD-10-CM | POA: Diagnosis not present

## 2017-04-24 DIAGNOSIS — I2510 Atherosclerotic heart disease of native coronary artery without angina pectoris: Secondary | ICD-10-CM | POA: Diagnosis not present

## 2017-04-24 DIAGNOSIS — N183 Chronic kidney disease, stage 3 (moderate): Secondary | ICD-10-CM | POA: Diagnosis not present

## 2017-04-26 ENCOUNTER — Encounter (HOSPITAL_COMMUNITY): Payer: Medicare Other

## 2017-04-26 ENCOUNTER — Encounter (HOSPITAL_COMMUNITY)
Admission: RE | Admit: 2017-04-26 | Discharge: 2017-04-26 | Disposition: A | Discharge status: Home / Self Care | Payer: Medicare Other | Source: Ambulatory Visit | Attending: Interventional Cardiology | Admitting: Interventional Cardiology

## 2017-04-26 DIAGNOSIS — I2510 Atherosclerotic heart disease of native coronary artery without angina pectoris: Secondary | ICD-10-CM | POA: Diagnosis not present

## 2017-04-26 DIAGNOSIS — I4891 Unspecified atrial fibrillation: Secondary | ICD-10-CM | POA: Diagnosis not present

## 2017-04-26 DIAGNOSIS — Z951 Presence of aortocoronary bypass graft: Secondary | ICD-10-CM | POA: Diagnosis not present

## 2017-04-26 DIAGNOSIS — Z79899 Other long term (current) drug therapy: Secondary | ICD-10-CM | POA: Diagnosis not present

## 2017-04-26 DIAGNOSIS — Z955 Presence of coronary angioplasty implant and graft: Secondary | ICD-10-CM

## 2017-04-26 DIAGNOSIS — N183 Chronic kidney disease, stage 3 (moderate): Secondary | ICD-10-CM | POA: Diagnosis not present

## 2017-04-26 DIAGNOSIS — M8580 Other specified disorders of bone density and structure, unspecified site: Secondary | ICD-10-CM | POA: Diagnosis not present

## 2017-04-29 ENCOUNTER — Telehealth: Payer: Self-pay | Admitting: Cardiology

## 2017-04-29 ENCOUNTER — Encounter (HOSPITAL_COMMUNITY)
Admission: RE | Admit: 2017-04-29 | Discharge: 2017-04-29 | Disposition: A | Discharge status: Home / Self Care | Payer: Medicare Other | Source: Ambulatory Visit | Attending: Interventional Cardiology | Admitting: Interventional Cardiology

## 2017-04-29 ENCOUNTER — Encounter (HOSPITAL_COMMUNITY): Payer: Medicare Other

## 2017-04-29 DIAGNOSIS — I4891 Unspecified atrial fibrillation: Secondary | ICD-10-CM | POA: Diagnosis not present

## 2017-04-29 DIAGNOSIS — N183 Chronic kidney disease, stage 3 (moderate): Secondary | ICD-10-CM | POA: Diagnosis not present

## 2017-04-29 DIAGNOSIS — Z955 Presence of coronary angioplasty implant and graft: Secondary | ICD-10-CM

## 2017-04-29 DIAGNOSIS — M8580 Other specified disorders of bone density and structure, unspecified site: Secondary | ICD-10-CM | POA: Diagnosis not present

## 2017-04-29 DIAGNOSIS — Z951 Presence of aortocoronary bypass graft: Secondary | ICD-10-CM | POA: Diagnosis not present

## 2017-04-29 DIAGNOSIS — I2510 Atherosclerotic heart disease of native coronary artery without angina pectoris: Secondary | ICD-10-CM | POA: Diagnosis not present

## 2017-04-29 DIAGNOSIS — Z79899 Other long term (current) drug therapy: Secondary | ICD-10-CM | POA: Diagnosis not present

## 2017-04-29 NOTE — Progress Notes (Signed)
OUTPATIENT CARDIAC REHAB  PMH:  03/13/2017 DES [name]  Primary Cardiologist: Dr. Tamala Julian  Pt arrived at cardiac rehab reporting episode of chest discomfort with radiation to jaw while walking up hill last night.  Pt states symptoms were relieved within 1 minute with rest.  Pt has had one other episode one week ago while walking uphill outside.  Pt able to exercise at cardiac rehab without difficulty.  Pt has been taking Imdur 30mg  daily for angina since 04/11/2017.  Phone call to Cecilie Kicks, NP to advise. Mickel Baas instructed pt to schedule office follow up in next 1-2 weeks. Mickel Baas will message office staff to contact pt to schedule.  Pt instructed to contact office if she has not heard from scheduler this afternoon.  Pt also instructed on proper NTG use and when to call 911.  Pt verbalized understanding.  Andi Hence, RN, BSN Cardiac Pulmonary Rehab

## 2017-04-29 NOTE — Progress Notes (Signed)
Reviewed home exercise with pt today.  Pt plans to do seated chair exercise to increase strength and core. Pt will do exercises 2x/week in addition to coming to cardiac rehab  Reviewed THR, pulse, RPE, sign and symptoms, NTG use, and when to call 911 or MD.  Also discussed weather considerations and indoor options.  Pt voiced understanding.    [employer], ACSM RCEP

## 2017-04-29 NOTE — Telephone Encounter (Signed)
Pt states she was at church yesterday.  Had to walk uphill and began having chest pressure and pressure in jaw and neck.  States that this episode was very similar to previous episodes.  Went away as soon as she was able to stop and rest.  Pt has not had anymore episodes since yesterday but states she did have one episode last week.  Scheduled pt to see Dr. Tamala Julian 11/27.  Advised I will send message to Dr. Tamala Julian for review and we could cancel appt if he felt appt not necessary.

## 2017-04-29 NOTE — Telephone Encounter (Signed)
Cardiac rehab called to let us know pt has had 2 episodes of chest pain since last visit.  She is on Imdur,  Have asked office to arrange follow up.  Pain goes away with rest.

## 2017-04-29 NOTE — Telephone Encounter (Signed)
Spoke with pt and made her aware to keep appt next week.  Pt appreciative for call.

## 2017-04-29 NOTE — Telephone Encounter (Signed)
Thanks.  Will assess on office visit next week.

## 2017-04-29 NOTE — Telephone Encounter (Signed)
Find out about this chest pain and whether it is in any way similar to prior.

## 2017-05-01 ENCOUNTER — Encounter (HOSPITAL_COMMUNITY): Payer: Medicare Other

## 2017-05-01 ENCOUNTER — Encounter (HOSPITAL_COMMUNITY)
Admission: RE | Admit: 2017-05-01 | Discharge: 2017-05-01 | Disposition: A | Discharge status: Home / Self Care | Payer: Medicare Other | Source: Ambulatory Visit | Attending: Interventional Cardiology | Admitting: Interventional Cardiology

## 2017-05-01 DIAGNOSIS — N183 Chronic kidney disease, stage 3 (moderate): Secondary | ICD-10-CM | POA: Diagnosis not present

## 2017-05-01 DIAGNOSIS — Z951 Presence of aortocoronary bypass graft: Secondary | ICD-10-CM | POA: Diagnosis not present

## 2017-05-01 DIAGNOSIS — M8580 Other specified disorders of bone density and structure, unspecified site: Secondary | ICD-10-CM | POA: Diagnosis not present

## 2017-05-01 DIAGNOSIS — Z955 Presence of coronary angioplasty implant and graft: Secondary | ICD-10-CM

## 2017-05-01 DIAGNOSIS — Z79899 Other long term (current) drug therapy: Secondary | ICD-10-CM | POA: Diagnosis not present

## 2017-05-01 DIAGNOSIS — I2510 Atherosclerotic heart disease of native coronary artery without angina pectoris: Secondary | ICD-10-CM | POA: Diagnosis not present

## 2017-05-01 DIAGNOSIS — I4891 Unspecified atrial fibrillation: Secondary | ICD-10-CM | POA: Diagnosis not present

## 2017-05-06 ENCOUNTER — Encounter (HOSPITAL_COMMUNITY): Payer: Medicare Other

## 2017-05-06 ENCOUNTER — Encounter (INDEPENDENT_AMBULATORY_CARE_PROVIDER_SITE_OTHER): Payer: Self-pay

## 2017-05-06 ENCOUNTER — Other Ambulatory Visit: Payer: Medicare Other | Admitting: *Deleted

## 2017-05-06 ENCOUNTER — Encounter (HOSPITAL_COMMUNITY)
Admission: RE | Admit: 2017-05-06 | Discharge: 2017-05-06 | Disposition: A | Discharge status: Home / Self Care | Payer: Medicare Other | Source: Ambulatory Visit | Attending: Interventional Cardiology | Admitting: Interventional Cardiology

## 2017-05-06 ENCOUNTER — Telehealth: Payer: Self-pay | Admitting: *Deleted

## 2017-05-06 DIAGNOSIS — N183 Chronic kidney disease, stage 3 (moderate): Secondary | ICD-10-CM | POA: Diagnosis not present

## 2017-05-06 DIAGNOSIS — Z955 Presence of coronary angioplasty implant and graft: Secondary | ICD-10-CM

## 2017-05-06 DIAGNOSIS — I4891 Unspecified atrial fibrillation: Secondary | ICD-10-CM | POA: Diagnosis not present

## 2017-05-06 DIAGNOSIS — R945 Abnormal results of liver function studies: Principal | ICD-10-CM

## 2017-05-06 DIAGNOSIS — I2510 Atherosclerotic heart disease of native coronary artery without angina pectoris: Secondary | ICD-10-CM | POA: Diagnosis not present

## 2017-05-06 DIAGNOSIS — Z79899 Other long term (current) drug therapy: Secondary | ICD-10-CM | POA: Diagnosis not present

## 2017-05-06 DIAGNOSIS — R7989 Other specified abnormal findings of blood chemistry: Secondary | ICD-10-CM

## 2017-05-06 DIAGNOSIS — E785 Hyperlipidemia, unspecified: Secondary | ICD-10-CM

## 2017-05-06 DIAGNOSIS — Z951 Presence of aortocoronary bypass graft: Secondary | ICD-10-CM | POA: Diagnosis not present

## 2017-05-06 DIAGNOSIS — M8580 Other specified disorders of bone density and structure, unspecified site: Secondary | ICD-10-CM | POA: Diagnosis not present

## 2017-05-06 LAB — LIPID PANEL
CHOL/HDL RATIO: 2.1 ratio (ref 0.0–4.4)
Cholesterol, Total: 117 mg/dL (ref 100–199)
HDL: 57 mg/dL (ref >39)
LDL CALC: 45 mg/dL (ref 0–99)
Triglycerides: 74 mg/dL (ref 0–149)
VLDL CHOLESTEROL CAL: 15 mg/dL (ref 5–40)

## 2017-05-06 LAB — HEPATIC FUNCTION PANEL
ALBUMIN: 4.1 g/dL (ref 3.5–4.7)
ALT: 51 IU/L — ABNORMAL HIGH (ref 0–32)
AST: 43 IU/L — ABNORMAL HIGH (ref 0–40)
Alkaline Phosphatase: 81 IU/L (ref 39–117)
BILIRUBIN TOTAL: 0.3 mg/dL (ref 0.0–1.2)
Bilirubin, Direct: 0.14 mg/dL (ref 0.00–0.40)
TOTAL PROTEIN: 6.1 g/dL (ref 6.0–8.5)

## 2017-05-06 NOTE — Telephone Encounter (Signed)
Pt has been notified of lab results by phone with verbal understanding. Pt agreeable to repeat LFT to be done 2 weeks 05/20/17. Pt confirmed her appt with Dr. Tamala Julian for tomorrow 05/07/17. Pt thanked me for my call today.

## 2017-05-06 NOTE — Telephone Encounter (Signed)
-----   Message from [place], [place] sent at 05/06/2017  3:48 PM EST ----- LDL at goal of less than 70. However LFTs is minimally elevated. Check LFTs in 2 weeks to see trend. Continue statin at current dose for now.

## 2017-05-07 ENCOUNTER — Encounter: Payer: Self-pay | Admitting: Interventional Cardiology

## 2017-05-07 ENCOUNTER — Ambulatory Visit (INDEPENDENT_AMBULATORY_CARE_PROVIDER_SITE_OTHER): Payer: Medicare Other | Admitting: Interventional Cardiology

## 2017-05-07 ENCOUNTER — Telehealth: Payer: Self-pay | Admitting: Interventional Cardiology

## 2017-05-07 VITALS — BP 140/60 | HR 60 | Ht 62.0 in | Wt 139.1 lb

## 2017-05-07 DIAGNOSIS — E785 Hyperlipidemia, unspecified: Secondary | ICD-10-CM | POA: Diagnosis not present

## 2017-05-07 DIAGNOSIS — I480 Paroxysmal atrial fibrillation: Secondary | ICD-10-CM | POA: Diagnosis not present

## 2017-05-07 DIAGNOSIS — I209 Angina pectoris, unspecified: Secondary | ICD-10-CM | POA: Diagnosis not present

## 2017-05-07 MED ORDER — CLOPIDOGREL BISULFATE 75 MG PO TABS: 75.0000 mg | ORAL_TABLET | Freq: Every day | ORAL | 3 refills | Status: DC

## 2017-05-07 NOTE — Telephone Encounter (Signed)
Pt aware of Dr. Thompson Caul recommendations.  Verified pharmacy and will send in prescription for plavix.

## 2017-05-07 NOTE — Telephone Encounter (Signed)
Pt in today to see Dr. Tamala Julian.  After pt left Dr. Tamala Julian asked that pt be contacted to d/c Brilinta and change to Plavix 75mg  QD.  Pt will take first dose of Plavix with last dose of Brilinta.  Attempted to call pt.  Left message to call back.

## 2017-05-07 NOTE — Patient Instructions (Signed)
Medication Instructions:  Your physician recommends that you continue on your current medications as directed. Please refer to the Current Medication list given to you today.  Labwork: None  Testing/Procedures: None  Follow-Up: Your physician wants you to follow-up in: 4-6 months with Dr. Tamala Julian.  You will receive a reminder letter in the mail two months in advance. If you don't receive a letter, please call our office to schedule the follow-up appointment.   Any Other Special Instructions Will Be Listed Below (If Applicable).     If you need a refill on your cardiac medications before your next appointment, please call your pharmacy.

## 2017-05-07 NOTE — Progress Notes (Signed)
Cardiology Office Note    Date:  05/07/2017   ID:  Sherry, Barnett [birth year], MRN [id number]  PCP:  Lajean Manes, MD  Cardiologist: Sinclair Grooms, MD   Chief Complaint  Patient presents with  . Coronary Artery Disease    History of Present Illness:  Sherry Barnett is a 83 y.o. female exertional chest and jaw discomfort which upon evaluation with coronary angiography in October 2018 identified high-grade obstruction mid [name] treated with a drug-eluting stent, prior history of paroxysmal/episodic atrial fibrillation but no documenting data available and on no anticoagulation therapy.  Doing relatively well.  Cannot afford Brilinta.  Will switch to Plavix 75 mg daily.  She should take a 75 mg Plavix tablet with the last Brilinta.  At rehab she has occasional jaw discomfort.  Since [name] stent, exertional related chest discomfort is completely resolved.  She feels she has brief episodes of atrial fibrillation from time to time.  We have not been able to document the presence or absence of atrial fibrillation based on graphic data.  Past Medical History:  Diagnosis Date  . A-fib ([place])   . Bilateral cataracts   . Blepharitis    chronic  . CAD (coronary artery disease), native coronary artery    10/18 PCI/DES to p/mLAD, normal EF  . Cardiovascular risk factor    23%  . Degenerative joint disease (DJD) of lumbar spine   . Diverticulosis   . Endometriosis   . Fibroids   . Hematuria, microscopic [id number]  . Hemorrhoids   . Hypercholesterolemia    10 years  . Osteopenia 03/2016   start alendrodate  . Renal disease    stage2/stage 3    Past Surgical History:  Procedure Laterality Date  . APPENDECTOMY    . CATARACT EXTRACTION, BILATERAL Bilateral 2001 & 11/2016  . CORONARY STENT INTERVENTION N/A 03/14/2017   Procedure: CORONARY STENT INTERVENTION;  Surgeon: Belva Crome, MD;  Location: [facility];  Service: Cardiovascular;  Laterality: N/A;  . LEFT HEART CATH AND  CORONARY ANGIOGRAPHY N/A 03/14/2017   Procedure: LEFT HEART CATH AND CORONARY ANGIOGRAPHY;  Surgeon: Belva Crome, MD;  Location: [facility];  Service: Cardiovascular;  Laterality: N/A;  . ovarian cysts Bilateral   . tubes removal Bilateral    endometriosis    Current Medications: Outpatient Medications Prior to Visit  Medication Sig Dispense Refill  . Artificial Tear Ointment (ARTIFICIAL TEARS) ointment Place 1 drop into both eyes as needed (dry eyes).     Marland Kitchen aspirin 81 MG EC tablet Take 81 mg by mouth daily with breakfast. Swallow whole.    Marland Kitchen atorvastatin (LIPITOR) 80 MG tablet Take 1 tablet (80 mg total) by mouth daily at 6 PM. 30 tablet 2  . Azelaic Acid (FINACEA) 15 % cream Apply 1 application topically at bedtime. After skin is thoroughly washed and patted dry, gently but thoroughly massage a thin film of azelaic acid cream into the affected area twice daily, in the morning and evening.     . calcium carbonate (TUMS EX) 750 MG chewable tablet Chew 1 tablet by mouth daily.    . cholecalciferol (VITAMIN D) 1000 units tablet Take 1,000 Units by mouth daily.    . isosorbide mononitrate (IMDUR) 30 MG 24 hr tablet Take 1 tablet (30 mg total) by mouth daily. 90 tablet 3  . metoprolol succinate (TOPROL-XL) 25 MG 24 hr tablet Take 25 mg by mouth daily with breakfast.    .  nitroGLYCERIN (NITROSTAT) 0.4 MG SL tablet Place 1 tablet (0.4 mg total) under the tongue every 5 (five) minutes as needed for chest pain. 25 tablet 3  . pantoprazole (PROTONIX) 40 MG tablet Take 1 tablet (40 mg total) by mouth daily. 30 tablet 6  . ticagrelor (BRILINTA) 90 MG TABS tablet Take 1 tablet (90 mg total) by mouth 2 (two) times daily. 180 tablet 1  . tobramycin-dexamethasone (TOBRADEX) ophthalmic ointment Place 1 application into both eyes See admin instructions. Tuesday AND Saturday patient does not use. Will apply at night on all other nights.    Marland Kitchen alendronate (FOSAMAX) 70 MG tablet Take 70 mg by mouth every  Saturday. Take with a full glass of water on an empty stomach.     Marland Kitchen aspirin EC 81 MG tablet Take 1 tablet (81 mg total) by mouth daily. (Patient not taking: Reported on 05/07/2017) 90 tablet 3  . metoprolol succinate (TOPROL XL) 25 MG 24 hr tablet Take 1 tablet (25 mg total) by mouth daily. (Patient not taking: Reported on 05/07/2017) 90 tablet 3   No facility-administered medications prior to visit.      Allergies:   Sulfa antibiotics; Novocain [procaine]; and Penicillins   Social History   Socioeconomic History  . Marital status: Single    Spouse name: None  . Number of children: None  . Years of education: None  . Highest education level: None  Social Needs  . Financial resource strain: None  . Food insecurity - worry: None  . Food insecurity - inability: None  . Transportation needs - medical: None  . Transportation needs - non-medical: None  Occupational History  . None  Tobacco Use  . Smoking status: Former Smoker    Years: 5.00    Types: Cigarettes    Last attempt to quit: 02/06/1969    Years since quitting: 48.2  . Smokeless tobacco: Never Used  Substance and Sexual Activity  . Alcohol use: No  . Drug use: No  . Sexual activity: None    Comment: MARRIED  Other Topics Concern  . None  Social History Narrative  . None     Family History:  The patient's family history includes Alzheimer's disease in her father; Heart attack in her mother; Hypertension in her father; Other (age of onset: 78) in her sister; Stroke in her father.   ROS:   Please see the history of present illness.    Easy bruising on Brilinta.  Cough, shortness of breath, constipation, otherwise no complaints. All other systems reviewed and are negative.   PHYSICAL EXAM:   VS:  BP 140/60   Pulse 60   Ht 5\' 2"  (1.575 m)   Wt 139 lb 1.9 oz (63.1 kg)   BMI 25.45 kg/m    GEN: Well nourished, well developed, in no acute distress.  Appears younger than her stated age 9: normal  Neck: no  JVD, carotid bruits, or masses Cardiac: RRR; no murmurs, rubs, or gallops,no edema  Respiratory:  clear to auscultation bilaterally, normal work of breathing GI: soft, nontender, nondistended, + BS MS: no deformity or atrophy  Skin: warm and dry, no rash Neuro:  Alert and Oriented x 3, Strength and sensation are intact Psych: euthymic mood, full affect  Wt Readings from Last 3 Encounters:  05/07/17 139 lb 1.9 oz (63.1 kg)  04/11/17 139 lb 5.3 oz (63.2 kg)  03/28/17 138 lb 12.8 oz (63 kg)      Studies/Labs Reviewed:   EKG:  EKG  normal sinus rhythm with normal appearance.  Recent Labs: 03/15/2017: BUN 17; Creatinine, Ser 0.97; Hemoglobin 11.7; Platelets 201; Potassium 3.8; Sodium 136 05/06/2017: ALT 51   Lipid Panel    Component Value Date/Time   CHOL 117 05/06/2017 0806   TRIG 74 05/06/2017 0806   HDL 57 05/06/2017 0806   CHOLHDL 2.1 05/06/2017 0806   LDLCALC 45 05/06/2017 0806    Additional studies/ records that were reviewed today include:  03/14/2017 cardiac catheterization and PCI report Coronary Diagrams   Diagnostic Diagram       Post-Intervention Diagram           ASSESSMENT:    1. Angina pectoris ([place])   2. Paroxysmal atrial fibrillation ([place])   3. Hyperlipidemia LDL goal <70      PLAN:  In order of problems listed above:  1. But less severe angina could be related to the residual disease that was not treated.  Angiographically there were no severe residual lesions.  We have added low-dose isosorbide with improvement of symptoms.  Plan clinical follow-up in 3-6 months.  Change Brilinta to Plavix. 2. May need to wear a continuous monitor to identify whether or not atrial fibrillation is really of a consideration. 3. Continue high-dose statin therapy for the time being.  Liver enzymes were minimally elevated  Clinical follow-up in 4-6 months.  Continue long-term monitoring to exclude atrial fibrillation.  Change Brilinta to Plavix.  We will start  Plavix 75 mg daily with last dose of Brilinta.  Medication Adjustments/Labs and Tests Ordered: Current medicines are reviewed at length with the patient today.  Concerns regarding medicines are outlined above.  Medication changes, Labs and Tests ordered today are listed in the Patient Instructions below. Patient Instructions  Medication Instructions:  Your physician recommends that you continue on your current medications as directed. Please refer to the Current Medication list given to you today.  Labwork: None  Testing/Procedures: None  Follow-Up: Your physician wants you to follow-up in: 4-6 months with Dr. Tamala Julian.  You will receive a reminder letter in the mail two months in advance. If you don't receive a letter, please call our office to schedule the follow-up appointment.   Any Other Special Instructions Will Be Listed Below (If Applicable).     If you need a refill on your cardiac medications before your next appointment, please call your pharmacy.      Signed, Sinclair Grooms, MD  05/07/2017 12:32 PM    [facility] [address], [place], [place]  [address] Phone: [phone]; Fax: [phone]

## 2017-05-08 ENCOUNTER — Encounter (HOSPITAL_COMMUNITY)
Admission: RE | Admit: 2017-05-08 | Discharge: 2017-05-08 | Disposition: A | Discharge status: Home / Self Care | Payer: Medicare Other | Source: Ambulatory Visit | Attending: Interventional Cardiology | Admitting: Interventional Cardiology

## 2017-05-08 ENCOUNTER — Encounter (HOSPITAL_COMMUNITY): Payer: Medicare Other

## 2017-05-08 DIAGNOSIS — I2510 Atherosclerotic heart disease of native coronary artery without angina pectoris: Secondary | ICD-10-CM | POA: Diagnosis not present

## 2017-05-08 DIAGNOSIS — Z955 Presence of coronary angioplasty implant and graft: Secondary | ICD-10-CM

## 2017-05-08 DIAGNOSIS — Z79899 Other long term (current) drug therapy: Secondary | ICD-10-CM | POA: Diagnosis not present

## 2017-05-08 DIAGNOSIS — I4891 Unspecified atrial fibrillation: Secondary | ICD-10-CM | POA: Diagnosis not present

## 2017-05-08 DIAGNOSIS — Z951 Presence of aortocoronary bypass graft: Secondary | ICD-10-CM | POA: Diagnosis not present

## 2017-05-08 DIAGNOSIS — N183 Chronic kidney disease, stage 3 (moderate): Secondary | ICD-10-CM | POA: Diagnosis not present

## 2017-05-08 DIAGNOSIS — M8580 Other specified disorders of bone density and structure, unspecified site: Secondary | ICD-10-CM | POA: Diagnosis not present

## 2017-05-10 ENCOUNTER — Encounter (HOSPITAL_COMMUNITY): Payer: Medicare Other

## 2017-05-10 ENCOUNTER — Ambulatory Visit (HOSPITAL_COMMUNITY): Payer: Self-pay | Admitting: Cardiac Rehabilitation

## 2017-05-10 DIAGNOSIS — Z955 Presence of coronary angioplasty implant and graft: Secondary | ICD-10-CM

## 2017-05-10 NOTE — Progress Notes (Signed)
Cardiac Individual Treatment Plan  Patient Details  Name: Sherry Barnett MRN: [id number] Date of Birth: Mar 25, 1933 Referring Provider:     CARDIAC REHAB PHASE II ORIENTATION from 04/11/2017 in [place]  Referring Provider  Daneen Schick MD      Initial Encounter Date:    CARDIAC REHAB PHASE II ORIENTATION from 04/11/2017 in [place]  Date  04/11/17  Referring Provider  Daneen Schick MD      Visit Diagnosis: S/P drug eluting coronary stent placement  Patient's Home Medications on Admission:  Current Outpatient Medications:  .  alendronate (FOSAMAX) 70 MG tablet, Take 70 mg by mouth every Saturday. Take with a full glass of water on an empty stomach. , Disp: , Rfl:  .  Artificial Tear Ointment (ARTIFICIAL TEARS) ointment, Place 1 drop into both eyes as needed (dry eyes). , Disp: , Rfl:  .  aspirin 81 MG EC tablet, Take 81 mg by mouth daily with breakfast. Swallow whole., Disp: , Rfl:  .  atorvastatin (LIPITOR) 80 MG tablet, Take 1 tablet (80 mg total) by mouth daily at 6 PM., Disp: 30 tablet, Rfl: 2 .  Azelaic Acid (FINACEA) 15 % cream, Apply 1 application topically at bedtime. After skin is thoroughly washed and patted dry, gently but thoroughly massage a thin film of azelaic acid cream into the affected area twice daily, in the morning and evening. , Disp: , Rfl:  .  calcium carbonate (TUMS EX) 750 MG chewable tablet, Chew 1 tablet by mouth daily., Disp: , Rfl:  .  cholecalciferol (VITAMIN D) 1000 units tablet, Take 1,000 Units by mouth daily., Disp: , Rfl:  .  clopidogrel (PLAVIX) 75 MG tablet, Take 1 tablet (75 mg total) by mouth daily., Disp: 90 tablet, Rfl: 3 .  isosorbide mononitrate (IMDUR) 30 MG 24 hr tablet, Take 1 tablet (30 mg total) by mouth daily., Disp: 90 tablet, Rfl: 3 .  metoprolol succinate (TOPROL-XL) 25 MG 24 hr tablet, Take 25 mg by mouth daily with breakfast., Disp: , Rfl:  .  nitroGLYCERIN (NITROSTAT)  0.4 MG SL tablet, Place 1 tablet (0.4 mg total) under the tongue every 5 (five) minutes as needed for chest pain., Disp: 25 tablet, Rfl: 3 .  pantoprazole (PROTONIX) 40 MG tablet, Take 1 tablet (40 mg total) by mouth daily., Disp: 30 tablet, Rfl: 6 .  tobramycin-dexamethasone (TOBRADEX) ophthalmic ointment, Place 1 application into both eyes See admin instructions. Tuesday AND Saturday patient does not use. Will apply at night on all other nights., Disp: , Rfl:   Past Medical History: Past Medical History:  Diagnosis Date  . A-fib ([place])   . Bilateral cataracts   . Blepharitis    chronic  . CAD (coronary artery disease), native coronary artery    10/18 PCI/DES to p/mLAD, normal EF  . Cardiovascular risk factor    23%  . Degenerative joint disease (DJD) of lumbar spine   . Diverticulosis   . Endometriosis   . Fibroids   . Hematuria, microscopic [id number]  . Hemorrhoids   . Hypercholesterolemia    10 years  . Osteopenia 03/2016   start alendrodate  . Renal disease    stage2/stage 3    Tobacco Use: Social History   Tobacco Use  Smoking Status Former Smoker  . Years: 5.00  . Types: Cigarettes  . Last attempt to quit: 02/06/1969  . Years since quitting: 48.2  Smokeless Tobacco Never Used    Labs:  Recent Review Flowsheet Data    Labs for ITP Cardiac and Pulmonary Rehab Latest Ref Rng & Units 05/06/2017   Cholestrol 100 - 199 mg/dL 117   LDLCALC 0 - 99 mg/dL 45   HDL >39 mg/dL 57   Trlycerides 0 - 149 mg/dL 74      Capillary Blood Glucose: No results found for: GLUCAP   Exercise Target Goals:    Exercise Program Goal: Individual exercise prescription set with THRR, safety & activity barriers. Participant demonstrates ability to understand and report RPE using BORG scale, to self-measure pulse accurately, and to acknowledge the importance of the exercise prescription.  Exercise Prescription Goal: Starting with aerobic activity 30 plus minutes a day, 3 days per week for  initial exercise prescription. Provide home exercise prescription and guidelines that participant acknowledges understanding prior to discharge.  Activity Barriers & Risk Stratification: Activity Barriers & Cardiac Risk Stratification - 04/11/17 1157      Activity Barriers & Cardiac Risk Stratification   Cardiac Risk Stratification  Moderate       6 Minute Walk: 6 Minute Walk    Row Name 04/11/17 1141 04/11/17 1208       6 Minute Walk   Phase  Initial  -    Distance  733 feet  -    Walk Time  4.54 minutes  -    # of Rest Breaks  0  -    MPH  1.8  -    METS  1.1  -    VO2 Peak  3.87  -    Symptoms  Yes (comment)  -    Comments  chest pressure radiating to bilateral jaws; SOB  chest pressure radiating to bilateral jaws; SOB. Test terminated at 4:54. Pt reported 4-5/10 for chest pain/discomfort    Resting HR  69 bpm  -    Resting BP  112/80  -    Resting Oxygen Saturation   97 %  -    Exercise Oxygen Saturation  during 6 min walk  99 %  -    Max Ex. HR  90 bpm  -    Max Ex. BP  128/84  -    2 Minute Post BP  130/51  -       Oxygen Initial Assessment:   Oxygen Re-Evaluation:   Oxygen Discharge (Final Oxygen Re-Evaluation):   Initial Exercise Prescription: Initial Exercise Prescription - 04/11/17 1100      Date of Initial Exercise RX and Referring Provider   Date  04/11/17    Referring Provider  Daneen Schick MD      Recumbant Bike   Level  1.5    Watts  10    Minutes  5    METs  2.24      NuStep   Level  2    SPM  75    Minutes  10    METs  2      Track   Laps  6    Minutes  10    METs  2.03      Prescription Details   Frequency (times per week)  3    Duration  Progress to 30 minutes of continuous aerobic without signs/symptoms of physical distress      Intensity   THRR 40-80% of Max Heartrate  55-110    Ratings of Perceived Exertion  11-13    Perceived Dyspnea  0-4      Progression   Progression  Continue  to progress workloads to maintain  intensity without signs/symptoms of physical distress.      Resistance Training   Training Prescription  Yes    Weight  2lbs    Reps  10-15       Perform Capillary Blood Glucose checks as needed.  Exercise Prescription Changes: Exercise Prescription Changes    Row Name 04/22/17 1344 05/06/17 1600           Response to Exercise   Blood Pressure (Admit)  130/70  120/80      Blood Pressure (Exercise)  122/80  120/60      Blood Pressure (Exit)  134/62  102/62      Heart Rate (Admit)  70 bpm  75 bpm      Heart Rate (Exercise)  85 bpm  83 bpm      Heart Rate (Exit)  69 bpm  63 bpm      Symptoms  none  none      Comments  pt was oriented to exercise equipment. Pt did well with first exercise session.  -      Duration  Continue with 30 min of aerobic exercise without signs/symptoms of physical distress.  Continue with 30 min of aerobic exercise without signs/symptoms of physical distress.      Intensity  THRR unchanged  THRR unchanged        Progression   Progression  Continue to progress workloads to maintain intensity without signs/symptoms of physical distress.  Continue to progress workloads to maintain intensity without signs/symptoms of physical distress.      Average METs  1.5  1.9        Resistance Training   Training Prescription  Yes  Yes      Weight  2lbs  2lbs      Reps  10-15  10-15      Time  10 Minutes  10 Minutes        Recumbant Bike   Level  1.5  1.5      Watts  10  10      Minutes  5  5      METs  1.5  1.5        NuStep   Level  2  3      SPM  50  70      Minutes  10  10      METs  1.3  1.6        Track   Laps  5  9      Minutes  10  10      METs  1.87  2.57        Home Exercise Plan   Plans to continue exercise at  -  Home (comment) seated chair exercises and [facility] equipment at senior center      Frequency  -  Add 2 additional days to program exercise sessions.      Initial Home Exercises Provided  -  04/29/17         Exercise  Comments: Exercise Comments    Row Name 04/22/17 1345 05/06/17 1643         Exercise Comments  Pt oriented to exercise equipment today. Pt did well with exercise prescription. Will continue to monitor pt's progress and activity levels.   Reviewed METS and goals. Pt is responding well to exercise program/prescription. Recently reviewed HEP, and will continue to monitor pt's fitness and activity levels.  Exercise Goals and Review: Exercise Goals    Row Name 04/11/17 1014             Exercise Goals   Increase Physical Activity  Yes       Intervention  Provide advice, education, support and counseling about physical activity/exercise needs.;Develop an individualized exercise prescription for aerobic and resistive training based on initial evaluation findings, risk stratification, comorbidities and participant's personal goals.       Expected Outcomes  Achievement of increased cardiorespiratory fitness and enhanced flexibility, muscular endurance and strength shown through measurements of functional capacity and personal statement of participant.       Increase Strength and Stamina  Yes Be able to exercise and walk long distances without SOB/CP       Intervention  Develop an individualized exercise prescription for aerobic and resistive training based on initial evaluation findings, risk stratification, comorbidities and participant's personal goals.;Provide advice, education, support and counseling about physical activity/exercise needs.       Expected Outcomes  Achievement of increased cardiorespiratory fitness and enhanced flexibility, muscular endurance and strength shown through measurements of functional capacity and personal statement of participant.       Able to understand and use rate of perceived exertion (RPE) scale  Yes       Intervention  Provide education and explanation on how to use RPE scale       Expected Outcomes  Short Term: Able to use RPE daily in rehab to express  subjective intensity level;Long Term:  Able to use RPE to guide intensity level when exercising independently       Knowledge and understanding of Target Heart Rate Range (THRR)  Yes       Intervention  Provide education and explanation of THRR including how the numbers were predicted and where they are located for reference       Expected Outcomes  Short Term: Able to state/look up THRR;Long Term: Able to use THRR to govern intensity when exercising independently;Short Term: Able to use daily as guideline for intensity in rehab       Able to check pulse independently  Yes       Intervention  Provide education and demonstration on how to check pulse in carotid and radial arteries.;Review the importance of being able to check your own pulse for safety during independent exercise       Expected Outcomes  Short Term: Able to explain why pulse checking is important during independent exercise;Long Term: Able to check pulse independently and accurately       Understanding of Exercise Prescription  Yes       Intervention  Provide education, explanation, and written materials on patient's individual exercise prescription       Expected Outcomes  Short Term: Able to explain program exercise prescription;Long Term: Able to explain home exercise prescription to exercise independently          Exercise Goals Re-Evaluation : Exercise Goals Re-Evaluation    Row Name 04/29/17 0946 05/06/17 1644           Exercise Goal Re-Evaluation   Exercise Goals Review  Increase Physical Activity;Able to understand and use rate of perceived exertion (RPE) scale;Knowledge and understanding of Target Heart Rate Range (THRR);Understanding of Exercise Prescription;Increase Strength and Stamina;Able to check pulse independently  Increase Physical Activity;Able to understand and use rate of perceived exertion (RPE) scale;Knowledge and understanding of Target Heart Rate Range (THRR);Understanding of Exercise Prescription;Increase  Strength and Stamina;Able to check pulse independently  Comments  Reviewed home exercise with pt today.  Pt plans to do seated chair exercise to increase strength and core. Pt will do exercises 2x/week in addition to coming to cardiac rehab  Reviewed THR, pulse, RPE, sign and symptoms, NTG use, and when to call 911 or MD.  Also discussed weather considerations and indoor options.  Pt voiced understanding.  Pt has increase exercise tolerance and walking capacity. Pt initially started with 5 laps of walking and is now up to 9 laps. Pt has c/o of anginal like symptoms with walking uphill and incline. Informed cardiology in which she will meet for further evaluation on 05/07/17. Will f/u.      Expected Outcomes  Pt will be compliant with HEP and improve in cardiorespiratory fitness  Pt will be compliant with HEP and improve in cardiorespiratory fitness          Discharge Exercise Prescription (Final Exercise Prescription Changes): Exercise Prescription Changes - 05/06/17 1600      Response to Exercise   Blood Pressure (Admit)  120/80    Blood Pressure (Exercise)  120/60    Blood Pressure (Exit)  102/62    Heart Rate (Admit)  75 bpm    Heart Rate (Exercise)  83 bpm    Heart Rate (Exit)  63 bpm    Symptoms  none    Duration  Continue with 30 min of aerobic exercise without signs/symptoms of physical distress.    Intensity  THRR unchanged      Progression   Progression  Continue to progress workloads to maintain intensity without signs/symptoms of physical distress.    Average METs  1.9      Resistance Training   Training Prescription  Yes    Weight  2lbs    Reps  10-15    Time  10 Minutes      Recumbant Bike   Level  1.5    Watts  10    Minutes  5    METs  1.5      NuStep   Level  3    SPM  70    Minutes  10    METs  1.6      Track   Laps  9    Minutes  10    METs  2.57      Home Exercise Plan   Plans to continue exercise at  Home (comment) seated chair exercises and  cardio equipment at senior center    Frequency  Add 2 additional days to program exercise sessions.    Initial Home Exercises Provided  04/29/17       Nutrition:  Target Goals: Understanding of nutrition guidelines, daily intake of sodium 1500mg , cholesterol 200mg , calories 30% from fat and 7% or less from saturated fats, daily to have 5 or more servings of fruits and vegetables.  Biometrics:    Nutrition Therapy Plan and Nutrition Goals: Nutrition Therapy & Goals - 04/11/17 1113      Nutrition Therapy   Diet  Therapeutic Lifestyle Changes      Personal Nutrition Goals   Nutrition Goal  Pt to maintain her current wt while in Cardiac Rehab.       Intervention Plan   Intervention  Prescribe, educate and counsel regarding individualized specific dietary modifications aiming towards targeted core components such as weight, hypertension, lipid management, diabetes, heart failure and other comorbidities.    Expected Outcomes  Short Term Goal: Understand basic principles of dietary content, such as calories,  fat, sodium, cholesterol and nutrients.;Long Term Goal: Adherence to prescribed nutrition plan.       Nutrition Discharge: Nutrition Scores: Nutrition Assessments - 04/11/17 1113      MEDFICTS Scores   Pre Score  31       Nutrition Goals Re-Evaluation:   Nutrition Goals Re-Evaluation:   Nutrition Goals Discharge (Final Nutrition Goals Re-Evaluation):   Psychosocial: Target Goals: Acknowledge presence or absence of significant depression and/or stress, maximize coping skills, provide positive support system. Participant is able to verbalize types and ability to use techniques and skills needed for reducing stress and depression.  Initial Review & Psychosocial Screening: Initial Psych Review & Screening - 04/11/17 1245      Initial Review   Current issues with  None Identified      Family Dynamics   Good Support System?  Yes    Comments  Pt husband, former  participant in cardiac rehab is supportive of pt rehab       Barriers   Psychosocial barriers to participate in program  The patient should benefit from training in stress management and relaxation.      Screening Interventions   Interventions  Encouraged to exercise       Quality of Life Scores: Quality of Life - 04/24/17 1418      Quality of Life Scores   Health/Function Pre  27.1 %    Socioeconomic Pre  28.07 %    Psych/Spiritual Pre  29.14 %    Family Pre  27.6 %    GLOBAL Pre  27.79 % QOL reviewed with pt.  overall scores very good. pt exhibits positive outlook with good coping skills. pt expresses gratitude for her many blessings.       PHQ-9: Recent Review Flowsheet Data    Depression screen [facility] 2/9 04/22/2017   Decreased Interest 0   Down, Depressed, Hopeless 0   PHQ - 2 Score 0     Interpretation of Total Score  Total Score Depression Severity:  1-4 = Minimal depression, 5-9 = Mild depression, 10-14 = Moderate depression, 15-19 = Moderately severe depression, 20-27 = Severe depression   Psychosocial Evaluation and Intervention: Psychosocial Evaluation - 04/22/17 1108      Psychosocial Evaluation & Interventions   Interventions  Encouraged to exercise with the program and follow exercise prescription    Comments  no psychosocial needs identified, no interventions necessary     Expected Outcomes  pt will exhibit positive outlook with good coping skills.     Continue Psychosocial Services   No Follow up required       Psychosocial Re-Evaluation: Psychosocial Re-Evaluation    [place] Name 05/07/17 1619             Psychosocial Re-Evaluation   Current issues with  None Identified       Comments  no psychosocial needs identified, no interventions necessary        Expected Outcomes  pt will exhibit positive outlook with good coping skills.        Interventions  Encouraged to attend Cardiac Rehabilitation for the exercise          Psychosocial Discharge  (Final Psychosocial Re-Evaluation): Psychosocial Re-Evaluation - 05/07/17 1619      Psychosocial Re-Evaluation   Current issues with  None Identified    Comments  no psychosocial needs identified, no interventions necessary     Expected Outcomes  pt will exhibit positive outlook with good coping skills.     Interventions  Encouraged  to attend Cardiac Rehabilitation for the exercise       Vocational Rehabilitation: Provide vocational rehab assistance to qualifying candidates.   Vocational Rehab Evaluation & Intervention: Vocational Rehab - 04/11/17 1246      Initial Vocational Rehab Evaluation & Intervention   Assessment shows need for Vocational Rehabilitation  No       Education: Education Goals: Education classes will be provided on a weekly basis, covering required topics. Participant will state understanding/return demonstration of topics presented.  Learning Barriers/Preferences: Learning Barriers/Preferences - 04/11/17 1013      Learning Barriers/Preferences   Learning Barriers  Sight    Learning Preferences  Written Material;Video;Pictoral       Education Topics: Count Your Pulse:  -Group instruction provided by verbal instruction, demonstration, patient participation and written materials to support subject.  Instructors address importance of being able to find your pulse and how to count your pulse when at home without a heart monitor.  Patients get hands on experience counting their pulse with staff help and individually.   Heart Attack, Angina, and Risk Factor Modification:  -Group instruction provided by verbal instruction, video, and written materials to support subject.  Instructors address signs and symptoms of angina and heart attacks.    Also discuss risk factors for heart disease and how to make changes to improve heart health risk factors.   Functional Fitness:  -Group instruction provided by verbal instruction, demonstration, patient participation, and  written materials to support subject.  Instructors address safety measures for doing things around the house.  Discuss how to get up and down off the floor, how to pick things up properly, how to safely get out of a chair without assistance, and balance training.   CARDIAC REHAB PHASE II EXERCISE from 05/08/2017 in [place]  Date  04/26/17  Instruction Review Code  2- meets goals/outcomes      Meditation and Mindfulness:  -Group instruction provided by verbal instruction, patient participation, and written materials to support subject.  Instructor addresses importance of mindfulness and meditation practice to help reduce stress and improve awareness.  Instructor also leads participants through a meditation exercise.    Stretching for Flexibility and Mobility:  -Group instruction provided by verbal instruction, patient participation, and written materials to support subject.  Instructors lead participants through series of stretches that are designed to increase flexibility thus improving mobility.  These stretches are additional exercise for major muscle groups that are typically performed during regular warm up and cool down.   Hands Only CPR:  -Group verbal, video, and participation provides a basic overview of AHA guidelines for community CPR. Role-play of emergencies allow participants the opportunity to practice calling for help and chest compression technique with discussion of AED use.   Hypertension: -Group verbal and written instruction that provides a basic overview of hypertension including the most recent diagnostic guidelines, risk factor reduction with self-care instructions and medication management.    Nutrition I class: Heart Healthy Eating:  -Group instruction provided by PowerPoint slides, verbal discussion, and written materials to support subject matter. The instructor gives an explanation and review of the Therapeutic Lifestyle Changes  diet recommendations, which includes a discussion on lipid goals, dietary fat, sodium, fiber, plant stanol/sterol esters, sugar, and the components of a well-balanced, healthy diet.   Nutrition II class: Lifestyle Skills:  -Group instruction provided by PowerPoint slides, verbal discussion, and written materials to support subject matter. The instructor gives an explanation and review of label reading,  grocery shopping for heart health, heart healthy recipe modifications, and ways to make healthier choices when eating out.   Diabetes Question & Answer:  -Group instruction provided by PowerPoint slides, verbal discussion, and written materials to support subject matter. The instructor gives an explanation and review of diabetes co-morbidities, pre- and post-prandial blood glucose goals, pre-exercise blood glucose goals, signs, symptoms, and treatment of hypoglycemia and hyperglycemia, and foot care basics.   Diabetes Blitz:  -Group instruction provided by PowerPoint slides, verbal discussion, and written materials to support subject matter. The instructor gives an explanation and review of the physiology behind type 1 and type 2 diabetes, diabetes medications and rational behind using different medications, pre- and post-prandial blood glucose recommendations and Hemoglobin A1c goals, diabetes diet, and exercise including blood glucose guidelines for exercising safely.    Portion Distortion:  -Group instruction provided by PowerPoint slides, verbal discussion, written materials, and food models to support subject matter. The instructor gives an explanation of serving size versus portion size, changes in portions sizes over the last 20 years, and what consists of a serving from each food group.   Stress Management:  -Group instruction provided by verbal instruction, video, and written materials to support subject matter.  Instructors review role of stress in heart disease and how to cope with  stress positively.     Exercising on Your Own:  -Group instruction provided by verbal instruction, power point, and written materials to support subject.  Instructors discuss benefits of exercise, components of exercise, frequency and intensity of exercise, and end points for exercise.  Also discuss use of nitroglycerin and activating EMS.  Review options of places to exercise outside of rehab.  Review guidelines for sex with heart disease.   Cardiac Drugs I:  -Group instruction provided by verbal instruction and written materials to support subject.  Instructor reviews cardiac drug classes: antiplatelets, anticoagulants, beta blockers, and statins.  Instructor discusses reasons, side effects, and lifestyle considerations for each drug class.   CARDIAC REHAB PHASE II EXERCISE from 05/08/2017 in [place]  Date  04/24/17  Instruction Review Code  2- meets goals/outcomes      Cardiac Drugs II:  -Group instruction provided by verbal instruction and written materials to support subject.  Instructor reviews cardiac drug classes: angiotensin converting enzyme inhibitors (ACE-I), angiotensin II receptor blockers (ARBs), nitrates, and calcium channel blockers.  Instructor discusses reasons, side effects, and lifestyle considerations for each drug class.   Anatomy and Physiology of the Circulatory System:  Group verbal and written instruction and models provide basic cardiac anatomy and physiology, with the coronary electrical and arterial systems. Review of: AMI, Angina, Valve disease, Heart Failure, Peripheral Artery Disease, Cardiac Arrhythmia, Pacemakers, and the ICD.   CARDIAC REHAB PHASE II EXERCISE from 05/08/2017 in [facility]  Date  05/08/17  Instruction Review Code  2- meets goals/outcomes      Other Education:  -Group or individual verbal, written, or video instructions that support the educational goals of the cardiac  rehab program.   Knowledge Questionnaire Score: Knowledge Questionnaire Score - 04/11/17 1149      Knowledge Questionnaire Score   Pre Score  19/24       Core Components/Risk Factors/Patient Goals at Admission: Personal Goals and Risk Factors at Admission - 04/11/17 1155      Core Components/Risk Factors/Patient Goals on Admission   Admit Weight  139 lb 5.3 oz (63.2 kg)    Goal Weight: Short Term  139 lb (63 kg)    Goal Weight: Long Term  139 lb (63 kg)       Core Components/Risk Factors/Patient Goals Review:  Goals and Risk Factor Review    Row Name 05/07/17 1617             Core Components/Risk Factors/Patient Goals Review   Personal Goals Review  Weight Management/Obesity;Improve shortness of breath with ADL's;Lipids       Review  pt making proactive efforts to decrease CAD RF and symptoms. pt actively participating in exercise and education offerings. pt has been walking at home with less angina.        Expected Outcomes  pt will participate in CR exercise, nutrition and lifestyle modification education to reduce overall RF.           Core Components/Risk Factors/Patient Goals at Discharge (Final Review):  Goals and Risk Factor Review - 05/07/17 1617      Core Components/Risk Factors/Patient Goals Review   Personal Goals Review  Weight Management/Obesity;Improve shortness of breath with ADL's;Lipids    Review  pt making proactive efforts to decrease CAD RF and symptoms. pt actively participating in exercise and education offerings. pt has been walking at home with less angina.     Expected Outcomes  pt will participate in CR exercise, nutrition and lifestyle modification education to reduce overall RF.        ITP Comments: ITP Comments    Row Name 04/11/17 0932 04/22/17 1108 05/07/17 1616       ITP Comments  Dr Fransico Him, Medical Director  pt tolerated first group exercise session without difficulty.  30 day ITP review.  pt demonstrates eagerness to  participate in group exercise.  pt with good attendance and participation.         Comments:

## 2017-05-13 ENCOUNTER — Encounter (HOSPITAL_COMMUNITY)
Admission: RE | Admit: 2017-05-13 | Discharge: 2017-05-13 | Disposition: A | Discharge status: Home / Self Care | Payer: Medicare Other | Source: Ambulatory Visit | Attending: Interventional Cardiology | Admitting: Interventional Cardiology

## 2017-05-13 ENCOUNTER — Encounter (HOSPITAL_COMMUNITY): Payer: Medicare Other

## 2017-05-13 DIAGNOSIS — M8580 Other specified disorders of bone density and structure, unspecified site: Secondary | ICD-10-CM | POA: Insufficient documentation

## 2017-05-13 DIAGNOSIS — I4891 Unspecified atrial fibrillation: Secondary | ICD-10-CM | POA: Diagnosis not present

## 2017-05-13 DIAGNOSIS — Z951 Presence of aortocoronary bypass graft: Secondary | ICD-10-CM | POA: Insufficient documentation

## 2017-05-13 DIAGNOSIS — I2510 Atherosclerotic heart disease of native coronary artery without angina pectoris: Secondary | ICD-10-CM | POA: Diagnosis not present

## 2017-05-13 DIAGNOSIS — Z87891 Personal history of nicotine dependence: Secondary | ICD-10-CM | POA: Insufficient documentation

## 2017-05-13 DIAGNOSIS — N183 Chronic kidney disease, stage 3 (moderate): Secondary | ICD-10-CM | POA: Insufficient documentation

## 2017-05-13 DIAGNOSIS — Z955 Presence of coronary angioplasty implant and graft: Secondary | ICD-10-CM

## 2017-05-13 DIAGNOSIS — Z79899 Other long term (current) drug therapy: Secondary | ICD-10-CM | POA: Insufficient documentation

## 2017-05-15 ENCOUNTER — Encounter (HOSPITAL_COMMUNITY): Payer: Medicare Other

## 2017-05-15 ENCOUNTER — Encounter (HOSPITAL_COMMUNITY)
Admission: RE | Admit: 2017-05-15 | Discharge: 2017-05-15 | Disposition: A | Discharge status: Home / Self Care | Payer: Medicare Other | Source: Ambulatory Visit

## 2017-05-15 DIAGNOSIS — N183 Chronic kidney disease, stage 3 (moderate): Secondary | ICD-10-CM | POA: Diagnosis not present

## 2017-05-15 DIAGNOSIS — Z955 Presence of coronary angioplasty implant and graft: Secondary | ICD-10-CM

## 2017-05-15 DIAGNOSIS — Z79899 Other long term (current) drug therapy: Secondary | ICD-10-CM | POA: Diagnosis not present

## 2017-05-15 DIAGNOSIS — I2510 Atherosclerotic heart disease of native coronary artery without angina pectoris: Secondary | ICD-10-CM | POA: Diagnosis not present

## 2017-05-15 DIAGNOSIS — M8580 Other specified disorders of bone density and structure, unspecified site: Secondary | ICD-10-CM | POA: Diagnosis not present

## 2017-05-15 DIAGNOSIS — Z951 Presence of aortocoronary bypass graft: Secondary | ICD-10-CM | POA: Diagnosis not present

## 2017-05-15 DIAGNOSIS — I4891 Unspecified atrial fibrillation: Secondary | ICD-10-CM | POA: Diagnosis not present

## 2017-05-17 ENCOUNTER — Encounter (HOSPITAL_COMMUNITY): Payer: Medicare Other

## 2017-05-17 ENCOUNTER — Encounter (HOSPITAL_COMMUNITY)
Admission: RE | Admit: 2017-05-17 | Discharge: 2017-05-17 | Disposition: A | Discharge status: Home / Self Care | Payer: Medicare Other | Source: Ambulatory Visit | Attending: Interventional Cardiology | Admitting: Interventional Cardiology

## 2017-05-17 DIAGNOSIS — Z79899 Other long term (current) drug therapy: Secondary | ICD-10-CM | POA: Diagnosis not present

## 2017-05-17 DIAGNOSIS — Z951 Presence of aortocoronary bypass graft: Secondary | ICD-10-CM | POA: Diagnosis not present

## 2017-05-17 DIAGNOSIS — N183 Chronic kidney disease, stage 3 (moderate): Secondary | ICD-10-CM | POA: Diagnosis not present

## 2017-05-17 DIAGNOSIS — I4891 Unspecified atrial fibrillation: Secondary | ICD-10-CM | POA: Diagnosis not present

## 2017-05-17 DIAGNOSIS — M8580 Other specified disorders of bone density and structure, unspecified site: Secondary | ICD-10-CM | POA: Diagnosis not present

## 2017-05-17 DIAGNOSIS — Z955 Presence of coronary angioplasty implant and graft: Secondary | ICD-10-CM

## 2017-05-17 DIAGNOSIS — I2510 Atherosclerotic heart disease of native coronary artery without angina pectoris: Secondary | ICD-10-CM | POA: Diagnosis not present

## 2017-05-20 ENCOUNTER — Encounter (HOSPITAL_COMMUNITY): Payer: Medicare Other

## 2017-05-20 ENCOUNTER — Other Ambulatory Visit: Payer: Medicare Other

## 2017-05-22 ENCOUNTER — Encounter (HOSPITAL_COMMUNITY): Payer: Medicare Other

## 2017-05-23 ENCOUNTER — Other Ambulatory Visit: Payer: Medicare Other | Admitting: *Deleted

## 2017-05-23 DIAGNOSIS — R7989 Other specified abnormal findings of blood chemistry: Secondary | ICD-10-CM

## 2017-05-23 DIAGNOSIS — R945 Abnormal results of liver function studies: Principal | ICD-10-CM

## 2017-05-23 LAB — HEPATIC FUNCTION PANEL
ALBUMIN: 4 g/dL (ref 3.5–4.7)
ALK PHOS: 89 IU/L (ref 39–117)
ALT: 34 IU/L — ABNORMAL HIGH (ref 0–32)
AST: 30 IU/L (ref 0–40)
BILIRUBIN TOTAL: 0.3 mg/dL (ref 0.0–1.2)
Bilirubin, Direct: 0.11 mg/dL (ref 0.00–0.40)
Total Protein: 6.4 g/dL (ref 6.0–8.5)

## 2017-05-24 ENCOUNTER — Encounter (HOSPITAL_COMMUNITY): Payer: Medicare Other

## 2017-05-24 ENCOUNTER — Telehealth: Payer: Self-pay | Admitting: Interventional Cardiology

## 2017-05-24 ENCOUNTER — Telehealth (HOSPITAL_COMMUNITY): Payer: Self-pay | Admitting: *Deleted

## 2017-05-24 NOTE — Telephone Encounter (Signed)
Follow up ° °Patient is returning call for lab results. °

## 2017-05-24 NOTE — Telephone Encounter (Signed)
-----   Message from [place], [place] sent at 05/24/2017  9:45 AM EST ----- Liver enzyme almost back to normal. No change.

## 2017-05-24 NOTE — Telephone Encounter (Signed)
Returned pts call and discussed her lab results. She verbalized understanding.

## 2017-05-27 ENCOUNTER — Encounter (HOSPITAL_COMMUNITY): Payer: Medicare Other

## 2017-05-27 ENCOUNTER — Encounter (HOSPITAL_COMMUNITY)
Admission: RE | Admit: 2017-05-27 | Discharge: 2017-05-27 | Disposition: A | Discharge status: Home / Self Care | Payer: Medicare Other | Source: Ambulatory Visit | Attending: Interventional Cardiology | Admitting: Interventional Cardiology

## 2017-05-27 DIAGNOSIS — I2510 Atherosclerotic heart disease of native coronary artery without angina pectoris: Secondary | ICD-10-CM | POA: Diagnosis not present

## 2017-05-27 DIAGNOSIS — M8580 Other specified disorders of bone density and structure, unspecified site: Secondary | ICD-10-CM | POA: Diagnosis not present

## 2017-05-27 DIAGNOSIS — I4891 Unspecified atrial fibrillation: Secondary | ICD-10-CM | POA: Diagnosis not present

## 2017-05-27 DIAGNOSIS — Z79899 Other long term (current) drug therapy: Secondary | ICD-10-CM | POA: Diagnosis not present

## 2017-05-27 DIAGNOSIS — Z951 Presence of aortocoronary bypass graft: Secondary | ICD-10-CM | POA: Diagnosis not present

## 2017-05-27 DIAGNOSIS — Z955 Presence of coronary angioplasty implant and graft: Secondary | ICD-10-CM

## 2017-05-27 DIAGNOSIS — N183 Chronic kidney disease, stage 3 (moderate): Secondary | ICD-10-CM | POA: Diagnosis not present

## 2017-05-27 NOTE — Progress Notes (Signed)
Sherry Barnett 83 y.o. female DOB: [birth year] MRN: [id number]      Nutrition Note  1. S/P drug eluting coronary stent placement    Labs:  Lipid Panel     Component Value Date/Time   CHOL 117 05/06/2017 0806   TRIG 74 05/06/2017 0806   HDL 57 05/06/2017 0806   CHOLHDL 2.1 05/06/2017 0806   LDLCALC 45 05/06/2017 0806   Nutrition Note Spoke with pt. Nutrition plan and survey reviewed with pt. Pt is following a Heart Healthy dietdiet. Age-appropriate nutrition recommendations discussed. Pt expressed understanding of the information reviewed.  Nutrition Diagnosis Food-and nutrition-related knowledge deficit related to lack of exposure to information as related to diagnosis of: ? CVD   Nutrition Intervention ? Pt's individual nutrition plan and goals reviewed with pt. ? Benefits of adopting Heart Healthy diet discussed when [employer] reviewed.  Nutrition Goal(s):  ? Pt to maintain her current wt while in Cardiac Rehab  Plan:  Pt to attend nutrition classes ? Nutrition I ? Nutrition II ? Portion Distortion  Will provide client-centered nutrition education as part of interdisciplinary care.   Monitor and evaluate progress toward nutrition goal with team.  Derek Mound, M.Ed, RD, LDN, CDE 05/27/2017 9:16 AM

## 2017-05-28 ENCOUNTER — Encounter (HOSPITAL_COMMUNITY): Payer: Self-pay

## 2017-05-29 ENCOUNTER — Encounter (HOSPITAL_COMMUNITY): Payer: Medicare Other

## 2017-05-29 ENCOUNTER — Encounter (HOSPITAL_COMMUNITY)
Admission: RE | Admit: 2017-05-29 | Discharge: 2017-05-29 | Disposition: A | Discharge status: Home / Self Care | Payer: Medicare Other | Source: Ambulatory Visit | Attending: Interventional Cardiology | Admitting: Interventional Cardiology

## 2017-05-29 DIAGNOSIS — M8580 Other specified disorders of bone density and structure, unspecified site: Secondary | ICD-10-CM | POA: Diagnosis not present

## 2017-05-29 DIAGNOSIS — Z955 Presence of coronary angioplasty implant and graft: Secondary | ICD-10-CM

## 2017-05-29 DIAGNOSIS — N183 Chronic kidney disease, stage 3 (moderate): Secondary | ICD-10-CM | POA: Diagnosis not present

## 2017-05-29 DIAGNOSIS — I4891 Unspecified atrial fibrillation: Secondary | ICD-10-CM | POA: Diagnosis not present

## 2017-05-29 DIAGNOSIS — Z951 Presence of aortocoronary bypass graft: Secondary | ICD-10-CM | POA: Diagnosis not present

## 2017-05-29 DIAGNOSIS — I2510 Atherosclerotic heart disease of native coronary artery without angina pectoris: Secondary | ICD-10-CM | POA: Diagnosis not present

## 2017-05-29 DIAGNOSIS — Z79899 Other long term (current) drug therapy: Secondary | ICD-10-CM | POA: Diagnosis not present

## 2017-05-31 ENCOUNTER — Encounter (HOSPITAL_COMMUNITY): Payer: Medicare Other

## 2017-05-31 ENCOUNTER — Encounter (HOSPITAL_COMMUNITY)
Admission: RE | Admit: 2017-05-31 | Discharge: 2017-05-31 | Disposition: A | Discharge status: Home / Self Care | Payer: Medicare Other | Source: Ambulatory Visit | Attending: Interventional Cardiology | Admitting: Interventional Cardiology

## 2017-05-31 DIAGNOSIS — I4891 Unspecified atrial fibrillation: Secondary | ICD-10-CM | POA: Diagnosis not present

## 2017-05-31 DIAGNOSIS — M8580 Other specified disorders of bone density and structure, unspecified site: Secondary | ICD-10-CM | POA: Diagnosis not present

## 2017-05-31 DIAGNOSIS — Z955 Presence of coronary angioplasty implant and graft: Secondary | ICD-10-CM

## 2017-05-31 DIAGNOSIS — Z79899 Other long term (current) drug therapy: Secondary | ICD-10-CM | POA: Diagnosis not present

## 2017-05-31 DIAGNOSIS — I2510 Atherosclerotic heart disease of native coronary artery without angina pectoris: Secondary | ICD-10-CM | POA: Diagnosis not present

## 2017-05-31 DIAGNOSIS — N183 Chronic kidney disease, stage 3 (moderate): Secondary | ICD-10-CM | POA: Diagnosis not present

## 2017-05-31 DIAGNOSIS — Z951 Presence of aortocoronary bypass graft: Secondary | ICD-10-CM | POA: Diagnosis not present

## 2017-06-01 ENCOUNTER — Other Ambulatory Visit: Payer: Self-pay | Admitting: Cardiology

## 2017-06-03 ENCOUNTER — Encounter (HOSPITAL_COMMUNITY): Payer: Medicare Other

## 2017-06-05 ENCOUNTER — Encounter (HOSPITAL_COMMUNITY)
Admission: RE | Admit: 2017-06-05 | Discharge: 2017-06-05 | Disposition: A | Discharge status: Home / Self Care | Payer: Medicare Other | Source: Ambulatory Visit | Attending: Interventional Cardiology | Admitting: Interventional Cardiology

## 2017-06-05 ENCOUNTER — Encounter (HOSPITAL_COMMUNITY): Payer: Medicare Other

## 2017-06-05 DIAGNOSIS — Z955 Presence of coronary angioplasty implant and graft: Secondary | ICD-10-CM

## 2017-06-07 ENCOUNTER — Encounter (HOSPITAL_COMMUNITY): Payer: Medicare Other

## 2017-06-07 ENCOUNTER — Encounter (HOSPITAL_COMMUNITY)
Admission: RE | Admit: 2017-06-07 | Discharge: 2017-06-07 | Disposition: A | Discharge status: Home / Self Care | Payer: Medicare Other | Source: Ambulatory Visit | Attending: Interventional Cardiology | Admitting: Interventional Cardiology

## 2017-06-07 DIAGNOSIS — Z951 Presence of aortocoronary bypass graft: Secondary | ICD-10-CM | POA: Diagnosis not present

## 2017-06-07 DIAGNOSIS — I4891 Unspecified atrial fibrillation: Secondary | ICD-10-CM | POA: Diagnosis not present

## 2017-06-07 DIAGNOSIS — Z955 Presence of coronary angioplasty implant and graft: Secondary | ICD-10-CM

## 2017-06-07 DIAGNOSIS — I2510 Atherosclerotic heart disease of native coronary artery without angina pectoris: Secondary | ICD-10-CM | POA: Diagnosis not present

## 2017-06-07 DIAGNOSIS — M8580 Other specified disorders of bone density and structure, unspecified site: Secondary | ICD-10-CM | POA: Diagnosis not present

## 2017-06-07 DIAGNOSIS — Z79899 Other long term (current) drug therapy: Secondary | ICD-10-CM | POA: Diagnosis not present

## 2017-06-07 DIAGNOSIS — N183 Chronic kidney disease, stage 3 (moderate): Secondary | ICD-10-CM | POA: Diagnosis not present

## 2017-06-07 NOTE — Progress Notes (Signed)
Cardiac Individual Treatment Plan  Patient Details  Name: Sherry Barnett MRN: [id number] Date of Birth: May 29, 1933 Referring Provider:     CARDIAC REHAB PHASE II ORIENTATION from 04/11/2017 in [place]  Referring Provider  Daneen Schick MD      Initial Encounter Date:    CARDIAC REHAB PHASE II ORIENTATION from 04/11/2017 in [place]  Date  04/11/17  Referring Provider  Daneen Schick MD      Visit Diagnosis: S/P drug eluting coronary stent placement  Patient's Home Medications on Admission:  Current Outpatient Medications:  .  alendronate (FOSAMAX) 70 MG tablet, Take 70 mg by mouth every Saturday. Take with a full glass of water on an empty stomach. , Disp: , Rfl:  .  Artificial Tear Ointment (ARTIFICIAL TEARS) ointment, Place 1 drop into both eyes as needed (dry eyes). , Disp: , Rfl:  .  aspirin 81 MG EC tablet, Take 81 mg by mouth daily with breakfast. Swallow whole., Disp: , Rfl:  .  atorvastatin (LIPITOR) 80 MG tablet, TAKE 1 TABLET BY MOUTH EVERY DAY AT 6:00 PM, Disp: 90 tablet, Rfl: 3 .  Azelaic Acid (FINACEA) 15 % cream, Apply 1 application topically at bedtime. After skin is thoroughly washed and patted dry, gently but thoroughly massage a thin film of azelaic acid cream into the affected area twice daily, in the morning and evening. , Disp: , Rfl:  .  calcium carbonate (TUMS EX) 750 MG chewable tablet, Chew 1 tablet by mouth daily., Disp: , Rfl:  .  cholecalciferol (VITAMIN D) 1000 units tablet, Take 1,000 Units by mouth daily., Disp: , Rfl:  .  clopidogrel (PLAVIX) 75 MG tablet, Take 1 tablet (75 mg total) by mouth daily., Disp: 90 tablet, Rfl: 3 .  isosorbide mononitrate (IMDUR) 30 MG 24 hr tablet, Take 1 tablet (30 mg total) by mouth daily., Disp: 90 tablet, Rfl: 3 .  metoprolol succinate (TOPROL-XL) 25 MG 24 hr tablet, Take 25 mg by mouth daily with breakfast., Disp: , Rfl:  .  nitroGLYCERIN (NITROSTAT) 0.4 MG  SL tablet, Place 1 tablet (0.4 mg total) under the tongue every 5 (five) minutes as needed for chest pain., Disp: 25 tablet, Rfl: 3 .  pantoprazole (PROTONIX) 40 MG tablet, Take 1 tablet (40 mg total) by mouth daily., Disp: 30 tablet, Rfl: 6 .  tobramycin-dexamethasone (TOBRADEX) ophthalmic ointment, Place 1 application into both eyes See admin instructions. Tuesday AND Saturday patient does not use. Will apply at night on all other nights., Disp: , Rfl:   Past Medical History: Past Medical History:  Diagnosis Date  . A-fib ([place])   . Bilateral cataracts   . Blepharitis    chronic  . CAD (coronary artery disease), native coronary artery    10/18 PCI/DES to p/mLAD, normal EF  . Cardiovascular risk factor    23%  . Degenerative joint disease (DJD) of lumbar spine   . Diverticulosis   . Endometriosis   . Fibroids   . Hematuria, microscopic [id number]  . Hemorrhoids   . Hypercholesterolemia    10 years  . Osteopenia 03/2016   start alendrodate  . Renal disease    stage2/stage 3    Tobacco Use: Social History   Tobacco Use  Smoking Status Former Smoker  . Years: 5.00  . Types: Cigarettes  . Last attempt to quit: 02/06/1969  . Years since quitting: 48.3  Smokeless Tobacco Never Used    Labs: Recent Review  Flowsheet Data    Labs for ITP Cardiac and Pulmonary Rehab Latest Ref Rng & Units 05/06/2017   Cholestrol 100 - 199 mg/dL 117   LDLCALC 0 - 99 mg/dL 45   HDL >39 mg/dL 57   Trlycerides 0 - 149 mg/dL 74      Capillary Blood Glucose: No results found for: GLUCAP   Exercise Target Goals:    Exercise Program Goal: Individual exercise prescription set with THRR, safety & activity barriers. Participant demonstrates ability to understand and report RPE using BORG scale, to self-measure pulse accurately, and to acknowledge the importance of the exercise prescription.  Exercise Prescription Goal: Starting with aerobic activity 30 plus minutes a day, 3 days per week for  initial exercise prescription. Provide home exercise prescription and guidelines that participant acknowledges understanding prior to discharge.  Activity Barriers & Risk Stratification: Activity Barriers & Cardiac Risk Stratification - 04/11/17 1157      Activity Barriers & Cardiac Risk Stratification   Cardiac Risk Stratification  Moderate       6 Minute Walk: 6 Minute Walk    Row Name 04/11/17 1141 04/11/17 1208       6 Minute Walk   Phase  Initial  -    Distance  733 feet  -    Walk Time  4.54 minutes  -    # of Rest Breaks  0  -    MPH  1.8  -    METS  1.1  -    VO2 Peak  3.87  -    Symptoms  Yes (comment)  -    Comments  chest pressure radiating to bilateral jaws; SOB  chest pressure radiating to bilateral jaws; SOB. Test terminated at 4:54. Pt reported 4-5/10 for chest pain/discomfort    Resting HR  69 bpm  -    Resting BP  112/80  -    Resting Oxygen Saturation   97 %  -    Exercise Oxygen Saturation  during 6 min walk  99 %  -    Max Ex. HR  90 bpm  -    Max Ex. BP  128/84  -    2 Minute Post BP  130/51  -       Oxygen Initial Assessment:   Oxygen Re-Evaluation:   Oxygen Discharge (Final Oxygen Re-Evaluation):   Initial Exercise Prescription: Initial Exercise Prescription - 04/11/17 1100      Date of Initial Exercise RX and Referring Provider   Date  04/11/17    Referring Provider  Daneen Schick MD      Recumbant Bike   Level  1.5    Watts  10    Minutes  5    METs  2.24      NuStep   Level  2    SPM  75    Minutes  10    METs  2      Track   Laps  6    Minutes  10    METs  2.03      Prescription Details   Frequency (times per week)  3    Duration  Progress to 30 minutes of continuous aerobic without signs/symptoms of physical distress      Intensity   THRR 40-80% of Max Heartrate  55-110    Ratings of Perceived Exertion  11-13    Perceived Dyspnea  0-4      Progression   Progression  Continue to progress  workloads to maintain  intensity without signs/symptoms of physical distress.      Resistance Training   Training Prescription  Yes    Weight  2lbs    Reps  10-15       Perform Capillary Blood Glucose checks as needed.  Exercise Prescription Changes: Exercise Prescription Changes    Row Name 04/22/17 1344 05/06/17 1600 05/17/17 1629 05/27/17 1600       Response to Exercise   Blood Pressure (Admit)  130/70  120/80  108/62  122/60    Blood Pressure (Exercise)  122/80  120/60  116/70  120/60    Blood Pressure (Exit)  134/62  102/62  112/60  112/64    Heart Rate (Admit)  70 bpm  75 bpm  79 bpm  79 bpm    Heart Rate (Exercise)  85 bpm  83 bpm  87 bpm  96 bpm    Heart Rate (Exit)  69 bpm  63 bpm  67 bpm  64 bpm    Symptoms  none  none  none  none    Comments  pt was oriented to exercise equipment. Pt did well with first exercise session.  -  -  -    Duration  Continue with 30 min of aerobic exercise without signs/symptoms of physical distress.  Continue with 30 min of aerobic exercise without signs/symptoms of physical distress.  Continue with 30 min of aerobic exercise without signs/symptoms of physical distress.  Continue with 30 min of aerobic exercise without signs/symptoms of physical distress.    Intensity  THRR unchanged  THRR unchanged  THRR unchanged  THRR unchanged      Progression   Progression  Continue to progress workloads to maintain intensity without signs/symptoms of physical distress.  Continue to progress workloads to maintain intensity without signs/symptoms of physical distress.  Continue to progress workloads to maintain intensity without signs/symptoms of physical distress.  Continue to progress workloads to maintain intensity without signs/symptoms of physical distress.    Average METs  1.5  1.9  1.7  1.7      Resistance Training   Training Prescription  Yes  Yes  Yes  Yes    Weight  2lbs  2lbs  2lbs  2lbs    Reps  10-15  10-15  10-15  10-15    Time  10 Minutes  10 Minutes  10 Minutes   10 Minutes      Recumbant Bike   Level  1.5  1.5  2  2     Watts  10  10  10  10     Minutes  5  5  5  5     METs  1.5  1.5  1.5  1.5      NuStep   Level  2  3  3  3     SPM  50  70  70  70    Minutes  10  10  10  10     METs  1.3  1.6  1.6  1.6      Track   Laps  5  9  4  6     Minutes  10  10  10  10     METs  1.87  2.57  1.7  2.03      Home Exercise Plan   Plans to continue exercise at  -  Home (comment) seated chair exercises and cardio equipment at senior center  Home (comment) seated chair exercises and cardio equipment  at [place] (comment) seated chair exercises and cardio equipment at senior center    Frequency  -  Add 2 additional days to program exercise sessions.  Add 2 additional days to program exercise sessions.  Add 2 additional days to program exercise sessions.    Initial Home Exercises Provided  -  04/29/17  04/29/17  04/29/17       Exercise Comments: Exercise Comments    Row Name 04/22/17 1345 05/06/17 1643 05/27/17 1103       Exercise Comments  Pt oriented to exercise equipment today. Pt did well with exercise prescription. Will continue to monitor pt's progress and activity levels.   Reviewed METS and goals. Pt is responding well to exercise program/prescription. Recently reviewed HEP, and will continue to monitor pt's fitness and activity levels.   Reviewed METS and goals. Pt is responding well to exercise program/prescription. Recently reviewed HEP, and will continue to monitor pt's fitness and activity levels.         Exercise Goals and Review: Exercise Goals    Row Name 04/11/17 1014             Exercise Goals   Increase Physical Activity  Yes       Intervention  Provide advice, education, support and counseling about physical activity/exercise needs.;Develop an individualized exercise prescription for aerobic and resistive training based on initial evaluation findings, risk stratification, comorbidities and participant's personal goals.        Expected Outcomes  Achievement of increased cardiorespiratory fitness and enhanced flexibility, muscular endurance and strength shown through measurements of functional capacity and personal statement of participant.       Increase Strength and Stamina  Yes Be able to exercise and walk long distances without SOB/CP       Intervention  Develop an individualized exercise prescription for aerobic and resistive training based on initial evaluation findings, risk stratification, comorbidities and participant's personal goals.;Provide advice, education, support and counseling about physical activity/exercise needs.       Expected Outcomes  Achievement of increased cardiorespiratory fitness and enhanced flexibility, muscular endurance and strength shown through measurements of functional capacity and personal statement of participant.       Able to understand and use rate of perceived exertion (RPE) scale  Yes       Intervention  Provide education and explanation on how to use RPE scale       Expected Outcomes  Short Term: Able to use RPE daily in rehab to express subjective intensity level;Long Term:  Able to use RPE to guide intensity level when exercising independently       Knowledge and understanding of Target Heart Rate Range (THRR)  Yes       Intervention  Provide education and explanation of THRR including how the numbers were predicted and where they are located for reference       Expected Outcomes  Short Term: Able to state/look up THRR;Long Term: Able to use THRR to govern intensity when exercising independently;Short Term: Able to use daily as guideline for intensity in rehab       Able to check pulse independently  Yes       Intervention  Provide education and demonstration on how to check pulse in carotid and radial arteries.;Review the importance of being able to check your own pulse for safety during independent exercise       Expected Outcomes  Short Term: Able to explain why pulse checking  is important during independent  exercise;Long Term: Able to check pulse independently and accurately       Understanding of Exercise Prescription  Yes       Intervention  Provide education, explanation, and written materials on patient's individual exercise prescription       Expected Outcomes  Short Term: Able to explain program exercise prescription;Long Term: Able to explain home exercise prescription to exercise independently          Exercise Goals Re-Evaluation : Exercise Goals Re-Evaluation    Row Name 04/29/17 0946 05/06/17 1644 05/27/17 1100         Exercise Goal Re-Evaluation   Exercise Goals Review  Increase Physical Activity;Able to understand and use rate of perceived exertion (RPE) scale;Knowledge and understanding of Target Heart Rate Range (THRR);Understanding of Exercise Prescription;Increase Strength and Stamina;Able to check pulse independently  Increase Physical Activity;Able to understand and use rate of perceived exertion (RPE) scale;Knowledge and understanding of Target Heart Rate Range (THRR);Understanding of Exercise Prescription;Increase Strength and Stamina;Able to check pulse independently  Increase Physical Activity;Able to understand and use rate of perceived exertion (RPE) scale;Knowledge and understanding of Target Heart Rate Range (THRR);Understanding of Exercise Prescription;Increase Strength and Stamina;Able to check pulse independently     Comments  Reviewed home exercise with pt today.  Pt plans to do seated chair exercise to increase strength and core. Pt will do exercises 2x/week in addition to coming to cardiac rehab  Reviewed THR, pulse, RPE, sign and symptoms, NTG use, and when to call 911 or MD.  Also discussed weather considerations and indoor options.  Pt voiced understanding.  Pt has increase exercise tolerance and walking capacity. Pt initially started with 5 laps of walking and is now up to 9 laps. Pt has c/o of anginal like symptoms with walking uphill  and incline. Informed cardiology in which she will meet for further evaluation on 05/07/17. Will f/u.  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 Pt modified performance in which she " lip sync" for the remainder of the concert. Pt stated that she is able to enjoy activities but takes breaks every now and then.      Expected Outcomes  Pt will be compliant with HEP and improve in cardiorespiratory fitness  Pt will be compliant with HEP and improve in cardiorespiratory fitness  Pt will continue to improve in cardiorespiratory fitness and continue to get back into activities while understanding limitations         Discharge Exercise Prescription (Final Exercise Prescription Changes): Exercise Prescription Changes - 05/27/17 1600      Response to Exercise   Blood Pressure (Admit)  122/60    Blood Pressure (Exercise)  120/60    Blood Pressure (Exit)  112/64    Heart Rate (Admit)  79 bpm    Heart Rate (Exercise)  96 bpm    Heart Rate (Exit)  64 bpm    Symptoms  none    Duration  Continue with 30 min of aerobic exercise without signs/symptoms of physical distress.    Intensity  THRR unchanged      Progression   Progression  Continue to progress workloads to maintain intensity without signs/symptoms of physical distress.    Average METs  1.7      Resistance Training   Training Prescription  Yes    Weight  2lbs    Reps  10-15    Time  10 Minutes      Recumbant Bike   Level  2    Watts  10    Minutes  5    METs  1.5      NuStep   Level  3    SPM  70    Minutes  10    METs  1.6      Track   Laps  6    Minutes  10    METs  2.03      Home Exercise Plan   Plans to continue exercise at  Home (comment) seated chair exercises and cardio equipment at senior center    Frequency  Add 2 additional days to program exercise sessions.    Initial Home Exercises Provided  04/29/17       Nutrition:  Target Goals: Understanding of nutrition guidelines, daily  intake of sodium 1500mg , cholesterol 200mg , calories 30% from fat and 7% or less from saturated fats, daily to have 5 or more servings of fruits and vegetables.  Biometrics:    Nutrition Therapy Plan and Nutrition Goals: Nutrition Therapy & Goals - 04/11/17 1113      Nutrition Therapy   Diet  Therapeutic Lifestyle Changes      Personal Nutrition Goals   Nutrition Goal  Pt to maintain her current wt while in Cardiac Rehab.       Intervention Plan   Intervention  Prescribe, educate and counsel regarding individualized specific dietary modifications aiming towards targeted core components such as weight, hypertension, lipid management, diabetes, heart failure and other comorbidities.    Expected Outcomes  Short Term Goal: Understand basic principles of dietary content, such as calories, fat, sodium, cholesterol and nutrients.;Long Term Goal: Adherence to prescribed nutrition plan.       Nutrition Discharge: Nutrition Scores: Nutrition Assessments - 04/11/17 1113      MEDFICTS Scores   Pre Score  31       Nutrition Goals Re-Evaluation:   Nutrition Goals Re-Evaluation:   Nutrition Goals Discharge (Final Nutrition Goals Re-Evaluation):   Psychosocial: Target Goals: Acknowledge presence or absence of significant depression and/or stress, maximize coping skills, provide positive support system. Participant is able to verbalize types and ability to use techniques and skills needed for reducing stress and depression.  Initial Review & Psychosocial Screening: Initial Psych Review & Screening - 04/11/17 1245      Initial Review   Current issues with  None Identified      Family Dynamics   Good Support System?  Yes    Comments  Pt husband, former participant in cardiac rehab is supportive of pt rehab       Barriers   Psychosocial barriers to participate in program  The patient should benefit from training in stress management and relaxation.      Screening Interventions    Interventions  Encouraged to exercise       Quality of Life Scores: Quality of Life - 04/24/17 1418      Quality of Life Scores   Health/Function Pre  27.1 %    Socioeconomic Pre  28.07 %    Psych/Spiritual Pre  29.14 %    Family Pre  27.6 %    GLOBAL Pre  27.79 % QOL reviewed with pt.  overall scores very good. pt exhibits positive outlook with good coping skills. pt expresses gratitude for her many blessings.       PHQ-9: Recent Review Flowsheet Data    Depression screen [facility] 2/9 04/22/2017   Decreased Interest 0   Down, Depressed, Hopeless 0  PHQ - 2 Score 0     Interpretation of Total Score  Total Score Depression Severity:  1-4 = Minimal depression, 5-9 = Mild depression, 10-14 = Moderate depression, 15-19 = Moderately severe depression, 20-27 = Severe depression   Psychosocial Evaluation and Intervention: Psychosocial Evaluation - 04/22/17 1108      Psychosocial Evaluation & Interventions   Interventions  Encouraged to exercise with the program and follow exercise prescription    Comments  no psychosocial needs identified, no interventions necessary     Expected Outcomes  pt will exhibit positive outlook with good coping skills.     Continue Psychosocial Services   No Follow up required       Psychosocial Re-Evaluation: Psychosocial Re-Evaluation    [place] Name 05/07/17 1619 05/28/17 1519           Psychosocial Re-Evaluation   Current issues with  None Identified  None Identified      Comments  no psychosocial needs identified, no interventions necessary   no psychosocial needs identified, no interventions necessary       Expected Outcomes  pt will exhibit positive outlook with good coping skills.   pt will exhibit positive outlook with good coping skills.       Interventions  Encouraged to attend Cardiac Rehabilitation for the exercise  Encouraged to attend Cardiac Rehabilitation for the exercise         Psychosocial Discharge (Final Psychosocial  Re-Evaluation): Psychosocial Re-Evaluation - 05/28/17 1519      Psychosocial Re-Evaluation   Current issues with  None Identified    Comments  no psychosocial needs identified, no interventions necessary     Expected Outcomes  pt will exhibit positive outlook with good coping skills.     Interventions  Encouraged to attend Cardiac Rehabilitation for the exercise       Vocational Rehabilitation: Provide vocational rehab assistance to qualifying candidates.   Vocational Rehab Evaluation & Intervention: Vocational Rehab - 04/11/17 1246      Initial Vocational Rehab Evaluation & Intervention   Assessment shows need for Vocational Rehabilitation  No       Education: Education Goals: Education classes will be provided on a weekly basis, covering required topics. Participant will state understanding/return demonstration of topics presented.  Learning Barriers/Preferences: Learning Barriers/Preferences - 04/11/17 1013      Learning Barriers/Preferences   Learning Barriers  Sight    Learning Preferences  Written Material;Video;Pictoral       Education Topics: Count Your Pulse:  -Group instruction provided by verbal instruction, demonstration, patient participation and written materials to support subject.  Instructors address importance of being able to find your pulse and how to count your pulse when at home without a heart monitor.  Patients get hands on experience counting their pulse with staff help and individually.   Heart Attack, Angina, and Risk Factor Modification:  -Group instruction provided by verbal instruction, video, and written materials to support subject.  Instructors address signs and symptoms of angina and heart attacks.    Also discuss risk factors for heart disease and how to make changes to improve heart health risk factors.   Functional Fitness:  -Group instruction provided by verbal instruction, demonstration, patient participation, and written materials to  support subject.  Instructors address safety measures for doing things around the house.  Discuss how to get up and down off the floor, how to pick things up properly, how to safely get out of a chair without assistance, and balance training.  CARDIAC REHAB PHASE II EXERCISE from 06/05/2017 in [place]  Date  04/26/17  Instruction Review Code  2- meets goals/outcomes      Meditation and Mindfulness:  -Group instruction provided by verbal instruction, patient participation, and written materials to support subject.  Instructor addresses importance of mindfulness and meditation practice to help reduce stress and improve awareness.  Instructor also leads participants through a meditation exercise.    CARDIAC REHAB PHASE II EXERCISE from 06/05/2017 in [place]  Date  05/15/17  Instruction Review Code  2- meets goals/outcomes      Stretching for Flexibility and Mobility:  -Group instruction provided by verbal instruction, patient participation, and written materials to support subject.  Instructors lead participants through series of stretches that are designed to increase flexibility thus improving mobility.  These stretches are additional exercise for major muscle groups that are typically performed during regular warm up and cool down.   Hands Only CPR:  -Group verbal, video, and participation provides a basic overview of AHA guidelines for community CPR. Role-play of emergencies allow participants the opportunity to practice calling for help and chest compression technique with discussion of AED use.   Hypertension: -Group verbal and written instruction that provides a basic overview of hypertension including the most recent diagnostic guidelines, risk factor reduction with self-care instructions and medication management.    Nutrition I class: Heart Healthy Eating:  -Group instruction provided by PowerPoint slides,  verbal discussion, and written materials to support subject matter. The instructor gives an explanation and review of the Therapeutic Lifestyle Changes diet recommendations, which includes a discussion on lipid goals, dietary fat, sodium, fiber, plant stanol/sterol esters, sugar, and the components of a well-balanced, healthy diet.   Nutrition II class: Lifestyle Skills:  -Group instruction provided by PowerPoint slides, verbal discussion, and written materials to support subject matter. The instructor gives an explanation and review of label reading, grocery shopping for heart health, heart healthy recipe modifications, and ways to make healthier choices when eating out.   Diabetes Question & Answer:  -Group instruction provided by PowerPoint slides, verbal discussion, and written materials to support subject matter. The instructor gives an explanation and review of diabetes co-morbidities, pre- and post-prandial blood glucose goals, pre-exercise blood glucose goals, signs, symptoms, and treatment of hypoglycemia and hyperglycemia, and foot care basics.   Diabetes Blitz:  -Group instruction provided by PowerPoint slides, verbal discussion, and written materials to support subject matter. The instructor gives an explanation and review of the physiology behind type 1 and type 2 diabetes, diabetes medications and rational behind using different medications, pre- and post-prandial blood glucose recommendations and Hemoglobin A1c goals, diabetes diet, and exercise including blood glucose guidelines for exercising safely.    Portion Distortion:  -Group instruction provided by PowerPoint slides, verbal discussion, written materials, and food models to support subject matter. The instructor gives an explanation of serving size versus portion size, changes in portions sizes over the last 20 years, and what consists of a serving from each food group.   Stress Management:  -Group instruction provided by  verbal instruction, video, and written materials to support subject matter.  Instructors review role of stress in heart disease and how to cope with stress positively.     CARDIAC REHAB PHASE II EXERCISE from 06/05/2017 in [place]  Date  06/05/17  Instruction Review Code  2- meets goals/outcomes      Exercising on Your Own:  -  Group instruction provided by verbal instruction, power point, and written materials to support subject.  Instructors discuss benefits of exercise, components of exercise, frequency and intensity of exercise, and end points for exercise.  Also discuss use of nitroglycerin and activating EMS.  Review options of places to exercise outside of rehab.  Review guidelines for sex with heart disease.   Cardiac Drugs I:  -Group instruction provided by verbal instruction and written materials to support subject.  Instructor reviews cardiac drug classes: antiplatelets, anticoagulants, beta blockers, and statins.  Instructor discusses reasons, side effects, and lifestyle considerations for each drug class.   CARDIAC REHAB PHASE II EXERCISE from 06/05/2017 in [place]  Date  04/24/17  Instruction Review Code  2- meets goals/outcomes      Cardiac Drugs II:  -Group instruction provided by verbal instruction and written materials to support subject.  Instructor reviews cardiac drug classes: angiotensin converting enzyme inhibitors (ACE-I), angiotensin II receptor blockers (ARBs), nitrates, and calcium channel blockers.  Instructor discusses reasons, side effects, and lifestyle considerations for each drug class.   Anatomy and Physiology of the Circulatory System:  Group verbal and written instruction and models provide basic cardiac anatomy and physiology, with the coronary electrical and arterial systems. Review of: AMI, Angina, Valve disease, Heart Failure, Peripheral Artery Disease, Cardiac Arrhythmia, Pacemakers,  and the ICD.   CARDIAC REHAB PHASE II EXERCISE from 06/05/2017 in [place]  Date  05/08/17  Instruction Review Code  2- meets goals/outcomes      Other Education:  -Group or individual verbal, written, or video instructions that support the educational goals of the cardiac rehab program.   Knowledge Questionnaire Score: Knowledge Questionnaire Score - 04/11/17 1149      Knowledge Questionnaire Score   Pre Score  19/24       Core Components/Risk Factors/Patient Goals at Admission: Personal Goals and Risk Factors at Admission - 04/11/17 1155      Core Components/Risk Factors/Patient Goals on Admission   Admit Weight  139 lb 5.3 oz (63.2 kg)    Goal Weight: Short Term  139 lb (63 kg)    Goal Weight: Long Term  139 lb (63 kg)       Core Components/Risk Factors/Patient Goals Review:  Goals and Risk Factor Review    Row Name 05/07/17 1617 05/28/17 1519           Core Components/Risk Factors/Patient Goals Review   Personal Goals Review  Weight Management/Obesity;Improve shortness of breath with ADL's;Lipids  Weight Management/Obesity;Improve shortness of breath with ADL's;Lipids      Review  pt making proactive efforts to decrease CAD RF and symptoms. pt actively participating in exercise and education offerings. pt has been walking at home with less angina.   pt making proactive efforts to decrease CAD RF and symptoms. pt actively participating in exercise and education offerings. pt has been walking at home with less angina.       Expected Outcomes  pt will participate in CR exercise, nutrition and lifestyle modification education to reduce overall RF.   pt will participate in CR exercise, nutrition and lifestyle modification education to reduce overall RF.          Core Components/Risk Factors/Patient Goals at Discharge (Final Review):  Goals and Risk Factor Review - 05/28/17 1519      Core Components/Risk Factors/Patient Goals Review    Personal Goals Review  Weight Management/Obesity;Improve shortness of breath with ADL's;Lipids  Review  pt making proactive efforts to decrease CAD RF and symptoms. pt actively participating in exercise and education offerings. pt has been walking at home with less angina.     Expected Outcomes  pt will participate in CR exercise, nutrition and lifestyle modification education to reduce overall RF.        ITP Comments: ITP Comments    Row Name 04/11/17 0932 04/22/17 1108 05/07/17 1616 05/28/17 1519     ITP Comments  Dr Fransico Him, Medical Director  pt tolerated first group exercise session without difficulty.  30 day ITP review.  pt demonstrates eagerness to participate in group exercise.  pt with good attendance and participation.   30 day ITP review.  pt demonstrates eagerness to participate in group exercise.  pt with good attendance and participation.        Comments:

## 2017-06-10 ENCOUNTER — Encounter (HOSPITAL_COMMUNITY): Payer: Medicare Other

## 2017-06-10 ENCOUNTER — Encounter (HOSPITAL_COMMUNITY)
Admission: RE | Admit: 2017-06-10 | Discharge: 2017-06-10 | Disposition: A | Discharge status: Home / Self Care | Payer: Medicare Other | Source: Ambulatory Visit | Attending: Interventional Cardiology | Admitting: Interventional Cardiology

## 2017-06-10 DIAGNOSIS — I2510 Atherosclerotic heart disease of native coronary artery without angina pectoris: Secondary | ICD-10-CM | POA: Diagnosis not present

## 2017-06-10 DIAGNOSIS — N183 Chronic kidney disease, stage 3 (moderate): Secondary | ICD-10-CM | POA: Diagnosis not present

## 2017-06-10 DIAGNOSIS — Z951 Presence of aortocoronary bypass graft: Secondary | ICD-10-CM | POA: Diagnosis not present

## 2017-06-10 DIAGNOSIS — Z955 Presence of coronary angioplasty implant and graft: Secondary | ICD-10-CM

## 2017-06-10 DIAGNOSIS — Z79899 Other long term (current) drug therapy: Secondary | ICD-10-CM | POA: Diagnosis not present

## 2017-06-10 DIAGNOSIS — M8580 Other specified disorders of bone density and structure, unspecified site: Secondary | ICD-10-CM | POA: Diagnosis not present

## 2017-06-10 DIAGNOSIS — I4891 Unspecified atrial fibrillation: Secondary | ICD-10-CM | POA: Diagnosis not present

## 2017-06-12 ENCOUNTER — Encounter (HOSPITAL_COMMUNITY)
Admission: RE | Admit: 2017-06-12 | Discharge: 2017-06-12 | Disposition: A | Discharge status: Home / Self Care | Payer: Medicare Other | Source: Ambulatory Visit | Attending: Interventional Cardiology | Admitting: Interventional Cardiology

## 2017-06-12 ENCOUNTER — Encounter (HOSPITAL_COMMUNITY): Payer: Medicare Other

## 2017-06-12 DIAGNOSIS — Z955 Presence of coronary angioplasty implant and graft: Secondary | ICD-10-CM

## 2017-06-12 DIAGNOSIS — Z951 Presence of aortocoronary bypass graft: Secondary | ICD-10-CM | POA: Diagnosis not present

## 2017-06-12 DIAGNOSIS — M8580 Other specified disorders of bone density and structure, unspecified site: Secondary | ICD-10-CM | POA: Insufficient documentation

## 2017-06-12 DIAGNOSIS — Z87891 Personal history of nicotine dependence: Secondary | ICD-10-CM | POA: Insufficient documentation

## 2017-06-12 DIAGNOSIS — I2510 Atherosclerotic heart disease of native coronary artery without angina pectoris: Secondary | ICD-10-CM | POA: Insufficient documentation

## 2017-06-12 DIAGNOSIS — Z79899 Other long term (current) drug therapy: Secondary | ICD-10-CM | POA: Insufficient documentation

## 2017-06-12 DIAGNOSIS — N183 Chronic kidney disease, stage 3 (moderate): Secondary | ICD-10-CM | POA: Insufficient documentation

## 2017-06-12 DIAGNOSIS — I4891 Unspecified atrial fibrillation: Secondary | ICD-10-CM | POA: Diagnosis not present

## 2017-06-14 ENCOUNTER — Encounter (HOSPITAL_COMMUNITY): Payer: Medicare Other

## 2017-06-17 ENCOUNTER — Encounter (HOSPITAL_COMMUNITY): Payer: Medicare Other

## 2017-06-17 ENCOUNTER — Encounter (HOSPITAL_COMMUNITY)
Admission: RE | Admit: 2017-06-17 | Discharge: 2017-06-17 | Disposition: A | Discharge status: Home / Self Care | Payer: Medicare Other | Source: Ambulatory Visit | Attending: Interventional Cardiology | Admitting: Interventional Cardiology

## 2017-06-17 DIAGNOSIS — I4891 Unspecified atrial fibrillation: Secondary | ICD-10-CM | POA: Diagnosis not present

## 2017-06-17 DIAGNOSIS — I2510 Atherosclerotic heart disease of native coronary artery without angina pectoris: Secondary | ICD-10-CM | POA: Diagnosis not present

## 2017-06-17 DIAGNOSIS — Z951 Presence of aortocoronary bypass graft: Secondary | ICD-10-CM | POA: Diagnosis not present

## 2017-06-17 DIAGNOSIS — H26491 Other secondary cataract, right eye: Secondary | ICD-10-CM | POA: Diagnosis not present

## 2017-06-17 DIAGNOSIS — Z955 Presence of coronary angioplasty implant and graft: Secondary | ICD-10-CM

## 2017-06-17 DIAGNOSIS — H0100A Unspecified blepharitis right eye, upper and lower eyelids: Secondary | ICD-10-CM | POA: Diagnosis not present

## 2017-06-17 DIAGNOSIS — M8580 Other specified disorders of bone density and structure, unspecified site: Secondary | ICD-10-CM | POA: Diagnosis not present

## 2017-06-17 DIAGNOSIS — H0100B Unspecified blepharitis left eye, upper and lower eyelids: Secondary | ICD-10-CM | POA: Diagnosis not present

## 2017-06-17 DIAGNOSIS — Z961 Presence of intraocular lens: Secondary | ICD-10-CM | POA: Diagnosis not present

## 2017-06-17 DIAGNOSIS — N183 Chronic kidney disease, stage 3 (moderate): Secondary | ICD-10-CM | POA: Diagnosis not present

## 2017-06-17 DIAGNOSIS — Z79899 Other long term (current) drug therapy: Secondary | ICD-10-CM | POA: Diagnosis not present

## 2017-06-19 ENCOUNTER — Encounter (HOSPITAL_COMMUNITY)
Admission: RE | Admit: 2017-06-19 | Discharge: 2017-06-19 | Disposition: A | Discharge status: Home / Self Care | Payer: Medicare Other | Source: Ambulatory Visit | Attending: Interventional Cardiology | Admitting: Interventional Cardiology

## 2017-06-19 ENCOUNTER — Encounter (HOSPITAL_COMMUNITY): Payer: Medicare Other

## 2017-06-19 DIAGNOSIS — Z955 Presence of coronary angioplasty implant and graft: Secondary | ICD-10-CM

## 2017-06-19 DIAGNOSIS — I2510 Atherosclerotic heart disease of native coronary artery without angina pectoris: Secondary | ICD-10-CM | POA: Diagnosis not present

## 2017-06-19 DIAGNOSIS — I4891 Unspecified atrial fibrillation: Secondary | ICD-10-CM | POA: Diagnosis not present

## 2017-06-19 DIAGNOSIS — Z951 Presence of aortocoronary bypass graft: Secondary | ICD-10-CM | POA: Diagnosis not present

## 2017-06-19 DIAGNOSIS — M8580 Other specified disorders of bone density and structure, unspecified site: Secondary | ICD-10-CM | POA: Diagnosis not present

## 2017-06-19 DIAGNOSIS — N183 Chronic kidney disease, stage 3 (moderate): Secondary | ICD-10-CM | POA: Diagnosis not present

## 2017-06-19 DIAGNOSIS — Z79899 Other long term (current) drug therapy: Secondary | ICD-10-CM | POA: Diagnosis not present

## 2017-06-21 ENCOUNTER — Encounter (HOSPITAL_COMMUNITY): Payer: Medicare Other

## 2017-06-21 ENCOUNTER — Encounter (HOSPITAL_COMMUNITY)
Admission: RE | Admit: 2017-06-21 | Discharge: 2017-06-21 | Disposition: A | Discharge status: Home / Self Care | Payer: Medicare Other | Source: Ambulatory Visit | Attending: Interventional Cardiology | Admitting: Interventional Cardiology

## 2017-06-21 DIAGNOSIS — I4891 Unspecified atrial fibrillation: Secondary | ICD-10-CM | POA: Diagnosis not present

## 2017-06-21 DIAGNOSIS — Z955 Presence of coronary angioplasty implant and graft: Secondary | ICD-10-CM

## 2017-06-21 DIAGNOSIS — N183 Chronic kidney disease, stage 3 (moderate): Secondary | ICD-10-CM | POA: Diagnosis not present

## 2017-06-21 DIAGNOSIS — I2510 Atherosclerotic heart disease of native coronary artery without angina pectoris: Secondary | ICD-10-CM | POA: Diagnosis not present

## 2017-06-21 DIAGNOSIS — Z951 Presence of aortocoronary bypass graft: Secondary | ICD-10-CM | POA: Diagnosis not present

## 2017-06-21 DIAGNOSIS — M8580 Other specified disorders of bone density and structure, unspecified site: Secondary | ICD-10-CM | POA: Diagnosis not present

## 2017-06-21 DIAGNOSIS — Z79899 Other long term (current) drug therapy: Secondary | ICD-10-CM | POA: Diagnosis not present

## 2017-06-21 NOTE — Addendum Note (Signed)
Encounter addended by: Rowe Pavy, RN on: 06/21/2017 9:02 AM  Actions taken: Visit Navigator Flowsheet section accepted

## 2017-06-21 NOTE — Addendum Note (Signed)
Encounter addended by: Sol Passer on: 06/21/2017 9:07 AM  Actions taken: Flowsheet data copied forward, Visit Navigator Flowsheet section accepted

## 2017-06-24 ENCOUNTER — Encounter (HOSPITAL_COMMUNITY)
Admission: RE | Admit: 2017-06-24 | Discharge: 2017-06-24 | Disposition: A | Discharge status: Home / Self Care | Payer: Medicare Other | Source: Ambulatory Visit | Attending: Interventional Cardiology | Admitting: Interventional Cardiology

## 2017-06-24 ENCOUNTER — Encounter (HOSPITAL_COMMUNITY): Payer: Medicare Other

## 2017-06-24 DIAGNOSIS — Z79899 Other long term (current) drug therapy: Secondary | ICD-10-CM | POA: Diagnosis not present

## 2017-06-24 DIAGNOSIS — Z951 Presence of aortocoronary bypass graft: Secondary | ICD-10-CM | POA: Diagnosis not present

## 2017-06-24 DIAGNOSIS — Z955 Presence of coronary angioplasty implant and graft: Secondary | ICD-10-CM

## 2017-06-24 DIAGNOSIS — N183 Chronic kidney disease, stage 3 (moderate): Secondary | ICD-10-CM | POA: Diagnosis not present

## 2017-06-24 DIAGNOSIS — I2510 Atherosclerotic heart disease of native coronary artery without angina pectoris: Secondary | ICD-10-CM | POA: Diagnosis not present

## 2017-06-24 DIAGNOSIS — I4891 Unspecified atrial fibrillation: Secondary | ICD-10-CM | POA: Diagnosis not present

## 2017-06-24 DIAGNOSIS — M8580 Other specified disorders of bone density and structure, unspecified site: Secondary | ICD-10-CM | POA: Diagnosis not present

## 2017-06-25 ENCOUNTER — Ambulatory Visit: Payer: Medicare Other | Admitting: Interventional Cardiology

## 2017-06-26 ENCOUNTER — Encounter (HOSPITAL_COMMUNITY): Payer: Medicare Other

## 2017-06-26 ENCOUNTER — Encounter (HOSPITAL_COMMUNITY)
Admission: RE | Admit: 2017-06-26 | Discharge: 2017-06-26 | Disposition: A | Discharge status: Home / Self Care | Payer: Medicare Other | Source: Ambulatory Visit | Attending: Interventional Cardiology | Admitting: Interventional Cardiology

## 2017-06-26 DIAGNOSIS — Z951 Presence of aortocoronary bypass graft: Secondary | ICD-10-CM | POA: Diagnosis not present

## 2017-06-26 DIAGNOSIS — M8580 Other specified disorders of bone density and structure, unspecified site: Secondary | ICD-10-CM | POA: Diagnosis not present

## 2017-06-26 DIAGNOSIS — I4891 Unspecified atrial fibrillation: Secondary | ICD-10-CM | POA: Diagnosis not present

## 2017-06-26 DIAGNOSIS — Z955 Presence of coronary angioplasty implant and graft: Secondary | ICD-10-CM

## 2017-06-26 DIAGNOSIS — Z79899 Other long term (current) drug therapy: Secondary | ICD-10-CM | POA: Diagnosis not present

## 2017-06-26 DIAGNOSIS — N183 Chronic kidney disease, stage 3 (moderate): Secondary | ICD-10-CM | POA: Diagnosis not present

## 2017-06-26 DIAGNOSIS — I2510 Atherosclerotic heart disease of native coronary artery without angina pectoris: Secondary | ICD-10-CM | POA: Diagnosis not present

## 2017-06-28 ENCOUNTER — Encounter (HOSPITAL_COMMUNITY)
Admission: RE | Admit: 2017-06-28 | Discharge: 2017-06-28 | Disposition: A | Discharge status: Home / Self Care | Payer: Medicare Other | Source: Ambulatory Visit | Attending: Interventional Cardiology | Admitting: Interventional Cardiology

## 2017-06-28 ENCOUNTER — Encounter (HOSPITAL_COMMUNITY): Payer: Medicare Other

## 2017-06-28 DIAGNOSIS — Z79899 Other long term (current) drug therapy: Secondary | ICD-10-CM | POA: Diagnosis not present

## 2017-06-28 DIAGNOSIS — I2510 Atherosclerotic heart disease of native coronary artery without angina pectoris: Secondary | ICD-10-CM | POA: Diagnosis not present

## 2017-06-28 DIAGNOSIS — M8580 Other specified disorders of bone density and structure, unspecified site: Secondary | ICD-10-CM | POA: Diagnosis not present

## 2017-06-28 DIAGNOSIS — Z951 Presence of aortocoronary bypass graft: Secondary | ICD-10-CM | POA: Diagnosis not present

## 2017-06-28 DIAGNOSIS — Z955 Presence of coronary angioplasty implant and graft: Secondary | ICD-10-CM

## 2017-06-28 DIAGNOSIS — N183 Chronic kidney disease, stage 3 (moderate): Secondary | ICD-10-CM | POA: Diagnosis not present

## 2017-06-28 DIAGNOSIS — I4891 Unspecified atrial fibrillation: Secondary | ICD-10-CM | POA: Diagnosis not present

## 2017-07-01 ENCOUNTER — Encounter (HOSPITAL_COMMUNITY): Payer: Medicare Other

## 2017-07-01 ENCOUNTER — Encounter (HOSPITAL_COMMUNITY)
Admission: RE | Admit: 2017-07-01 | Discharge: 2017-07-01 | Disposition: A | Discharge status: Home / Self Care | Payer: Medicare Other | Source: Ambulatory Visit | Attending: Interventional Cardiology | Admitting: Interventional Cardiology

## 2017-07-01 DIAGNOSIS — Z951 Presence of aortocoronary bypass graft: Secondary | ICD-10-CM | POA: Diagnosis not present

## 2017-07-01 DIAGNOSIS — I4891 Unspecified atrial fibrillation: Secondary | ICD-10-CM | POA: Diagnosis not present

## 2017-07-01 DIAGNOSIS — N183 Chronic kidney disease, stage 3 (moderate): Secondary | ICD-10-CM | POA: Diagnosis not present

## 2017-07-01 DIAGNOSIS — I2510 Atherosclerotic heart disease of native coronary artery without angina pectoris: Secondary | ICD-10-CM | POA: Diagnosis not present

## 2017-07-01 DIAGNOSIS — Z955 Presence of coronary angioplasty implant and graft: Secondary | ICD-10-CM

## 2017-07-01 DIAGNOSIS — M8580 Other specified disorders of bone density and structure, unspecified site: Secondary | ICD-10-CM | POA: Diagnosis not present

## 2017-07-01 DIAGNOSIS — Z79899 Other long term (current) drug therapy: Secondary | ICD-10-CM | POA: Diagnosis not present

## 2017-07-03 ENCOUNTER — Encounter (HOSPITAL_COMMUNITY): Payer: Medicare Other

## 2017-07-03 ENCOUNTER — Encounter (HOSPITAL_COMMUNITY)
Admission: RE | Admit: 2017-07-03 | Discharge: 2017-07-03 | Disposition: A | Discharge status: Home / Self Care | Payer: Medicare Other | Source: Ambulatory Visit | Attending: Interventional Cardiology | Admitting: Interventional Cardiology

## 2017-07-03 DIAGNOSIS — N183 Chronic kidney disease, stage 3 (moderate): Secondary | ICD-10-CM | POA: Diagnosis not present

## 2017-07-03 DIAGNOSIS — I2510 Atherosclerotic heart disease of native coronary artery without angina pectoris: Secondary | ICD-10-CM | POA: Diagnosis not present

## 2017-07-03 DIAGNOSIS — I4891 Unspecified atrial fibrillation: Secondary | ICD-10-CM | POA: Diagnosis not present

## 2017-07-03 DIAGNOSIS — M8580 Other specified disorders of bone density and structure, unspecified site: Secondary | ICD-10-CM | POA: Diagnosis not present

## 2017-07-03 DIAGNOSIS — Z951 Presence of aortocoronary bypass graft: Secondary | ICD-10-CM | POA: Diagnosis not present

## 2017-07-03 DIAGNOSIS — Z955 Presence of coronary angioplasty implant and graft: Secondary | ICD-10-CM

## 2017-07-03 DIAGNOSIS — Z79899 Other long term (current) drug therapy: Secondary | ICD-10-CM | POA: Diagnosis not present

## 2017-07-03 NOTE — Progress Notes (Signed)
Cardiac Individual Treatment Plan  Patient Details  Name: Sherry Barnett MRN: [id number] Date of Birth: 1933-01-28 Referring Provider:     CARDIAC REHAB PHASE II ORIENTATION from 04/11/2017 in [facility]  Referring Provider  Daneen Schick MD      Initial Encounter Date:    CARDIAC REHAB PHASE II ORIENTATION from 04/11/2017 in [facility]  Date  04/11/17  Referring Provider  Daneen Schick MD      Visit Diagnosis: S/P drug eluting coronary stent placement  Patient's Home Medications on Admission:  Current Outpatient Medications:  .  alendronate (FOSAMAX) 70 MG tablet, Take 70 mg by mouth every Saturday. Take with a full glass of water on an empty stomach. , Disp: , Rfl:  .  Artificial Tear Ointment (ARTIFICIAL TEARS) ointment, Place 1 drop into both eyes as needed (dry eyes). , Disp: , Rfl:  .  aspirin 81 MG EC tablet, Take 81 mg by mouth daily with breakfast. Swallow whole., Disp: , Rfl:  .  atorvastatin (LIPITOR) 80 MG tablet, TAKE 1 TABLET BY MOUTH EVERY DAY AT 6:00 PM, Disp: 90 tablet, Rfl: 3 .  Azelaic Acid (FINACEA) 15 % cream, Apply 1 application topically at bedtime. After skin is thoroughly washed and patted dry, gently but thoroughly massage a thin film of azelaic acid cream into the affected area twice daily, in the morning and evening. , Disp: , Rfl:  .  calcium carbonate (TUMS EX) 750 MG chewable tablet, Chew 1 tablet by mouth daily., Disp: , Rfl:  .  cholecalciferol (VITAMIN D) 1000 units tablet, Take 1,000 Units by mouth daily., Disp: , Rfl:  .  clopidogrel (PLAVIX) 75 MG tablet, Take 1 tablet (75 mg total) by mouth daily., Disp: 90 tablet, Rfl: 3 .  isosorbide mononitrate (IMDUR) 30 MG 24 hr tablet, Take 1 tablet (30 mg total) by mouth daily., Disp: 90 tablet, Rfl: 3 .  metoprolol succinate (TOPROL-XL) 25 MG 24 hr tablet, Take 25 mg by mouth daily with breakfast., Disp: , Rfl:  .  nitroGLYCERIN (NITROSTAT) 0.4 MG  SL tablet, Place 1 tablet (0.4 mg total) under the tongue every 5 (five) minutes as needed for chest pain., Disp: 25 tablet, Rfl: 3 .  pantoprazole (PROTONIX) 40 MG tablet, Take 1 tablet (40 mg total) by mouth daily., Disp: 30 tablet, Rfl: 6 .  tobramycin-dexamethasone (TOBRADEX) ophthalmic ointment, Place 1 application into both eyes See admin instructions. Tuesday AND Saturday patient does not use. Will apply at night on all other nights., Disp: , Rfl:   Past Medical History: Past Medical History:  Diagnosis Date  . A-fib ([place])   . Bilateral cataracts   . Blepharitis    chronic  . CAD (coronary artery disease), native coronary artery    10/18 PCI/DES to p/mLAD, normal EF  . Cardiovascular risk factor    23%  . Degenerative joint disease (DJD) of lumbar spine   . Diverticulosis   . Endometriosis   . Fibroids   . Hematuria, microscopic [id number]  . Hemorrhoids   . Hypercholesterolemia    10 years  . Osteopenia 03/2016   start alendrodate  . Renal disease    stage2/stage 3    Tobacco Use: Social History   Tobacco Use  Smoking Status Former Smoker  . Years: 5.00  . Types: Cigarettes  . Last attempt to quit: 02/06/1969  . Years since quitting: 48.4  Smokeless Tobacco Never Used    Labs: Recent Review  Flowsheet Data    Labs for ITP Cardiac and Pulmonary Rehab Latest Ref Rng & Units 05/06/2017   Cholestrol 100 - 199 mg/dL 117   LDLCALC 0 - 99 mg/dL 45   HDL >39 mg/dL 57   Trlycerides 0 - 149 mg/dL 74      Capillary Blood Glucose: No results found for: GLUCAP   Exercise Target Goals:    Exercise Program Goal: Individual exercise prescription set using results from initial 6 min walk test and THRR while considering  patient's activity barriers and safety.   Exercise Prescription Goal: Initial exercise prescription builds to 30-45 minutes a day of aerobic activity, 2-3 days per week.  Home exercise guidelines will be given to patient during program as part of exercise  prescription that the participant will acknowledge.  Activity Barriers & Risk Stratification: Activity Barriers & Cardiac Risk Stratification - 04/11/17 1157      Activity Barriers & Cardiac Risk Stratification   Cardiac Risk Stratification  Moderate       6 Minute Walk: 6 Minute Walk    Row Name 04/11/17 1141 04/11/17 1208       6 Minute Walk   Phase  Initial  -    Distance  733 feet  -    Walk Time  4.54 minutes  -    # of Rest Breaks  0  -    MPH  1.8  -    METS  1.1  -    VO2 Peak  3.87  -    Symptoms  Yes (comment)  -    Comments  chest pressure radiating to bilateral jaws; SOB  chest pressure radiating to bilateral jaws; SOB. Test terminated at 4:54. Pt reported 4-5/10 for chest pain/discomfort    Resting HR  69 bpm  -    Resting BP  112/80  -    Resting Oxygen Saturation   97 %  -    Exercise Oxygen Saturation  during 6 min walk  99 %  -    Max Ex. HR  90 bpm  -    Max Ex. BP  128/84  -    2 Minute Post BP  130/51  -       Oxygen Initial Assessment:   Oxygen Re-Evaluation:   Oxygen Discharge (Final Oxygen Re-Evaluation):   Initial Exercise Prescription: Initial Exercise Prescription - 04/11/17 1100      Date of Initial Exercise RX and Referring Provider   Date  04/11/17    Referring Provider  Daneen Schick MD      Recumbant Bike   Level  1.5    Watts  10    Minutes  5    METs  2.24      NuStep   Level  2    SPM  75    Minutes  10    METs  2      Track   Laps  6    Minutes  10    METs  2.03      Prescription Details   Frequency (times per week)  3    Duration  Progress to 30 minutes of continuous aerobic without signs/symptoms of physical distress      Intensity   THRR 40-80% of Max Heartrate  55-110    Ratings of Perceived Exertion  11-13    Perceived Dyspnea  0-4      Progression   Progression  Continue to progress workloads to maintain intensity  without signs/symptoms of physical distress.      Resistance Training   Training  Prescription  Yes    Weight  2lbs    Reps  10-15       Perform Capillary Blood Glucose checks as needed.  Exercise Prescription Changes: Exercise Prescription Changes    Row Name 04/22/17 1344 05/06/17 1600 05/17/17 1629 05/27/17 1600 06/17/17 1600     Response to Exercise   Blood Pressure (Admit)  130/70  120/80  108/62  122/60  102/70   Blood Pressure (Exercise)  122/80  120/60  116/70  120/60  114/60   Blood Pressure (Exit)  134/62  102/62  112/60  112/64  102/60   Heart Rate (Admit)  70 bpm  75 bpm  79 bpm  79 bpm  78 bpm   Heart Rate (Exercise)  85 bpm  83 bpm  87 bpm  96 bpm  86 bpm   Heart Rate (Exit)  69 bpm  63 bpm  67 bpm  64 bpm  78 bpm   Symptoms  none  none  none  none  none   Comments  pt was oriented to exercise equipment. Pt did well with first exercise session.  -  -  -  -   Duration  Continue with 30 min of aerobic exercise without signs/symptoms of physical distress.  Continue with 30 min of aerobic exercise without signs/symptoms of physical distress.  Continue with 30 min of aerobic exercise without signs/symptoms of physical distress.  Continue with 30 min of aerobic exercise without signs/symptoms of physical distress.  Continue with 30 min of aerobic exercise without signs/symptoms of physical distress.   Intensity  THRR unchanged  THRR unchanged  THRR unchanged  THRR unchanged  THRR unchanged     Progression   Progression  Continue to progress workloads to maintain intensity without signs/symptoms of physical distress.  Continue to progress workloads to maintain intensity without signs/symptoms of physical distress.  Continue to progress workloads to maintain intensity without signs/symptoms of physical distress.  Continue to progress workloads to maintain intensity without signs/symptoms of physical distress.  Continue to progress workloads to maintain intensity without signs/symptoms of physical distress.   Average METs  1.5  1.9  1.7  1.7  1.7     Resistance  Training   Training Prescription  Yes  Yes  Yes  Yes  Yes   Weight  2lbs  2lbs  2lbs  2lbs  2lbs   Reps  10-15  10-15  10-15  10-15  10-15   Time  10 Minutes  10 Minutes  10 Minutes  10 Minutes  10 Minutes     Recumbant Bike   Level  1.5  1.5  2  2  2    Watts  10  10  10  10  12    Minutes  5  5  5  5  5    METs  1.5  1.5  1.5  1.5  2     NuStep   Level  2  3  3  3  3    SPM  50  70  70  70  70   Minutes  10  10  10  10  10    METs  1.3  1.6  1.6  1.6  1.6     Track   Laps  5  9  4  6  5    Minutes  10  10  10  10   10  METs  1.87  2.57  1.7  2.03  1.8     Home Exercise Plan   Plans to continue exercise at  -  Home (comment) seated chair exercises and cardio equipment at senior center  Home (comment) seated chair exercises and cardio equipment at senior center  Home (comment) seated chair exercises and cardio equipment at [facility] (comment) seated chair exercises and cardio equipment at senior center   Frequency  -  Add 2 additional days to program exercise sessions.  Add 2 additional days to program exercise sessions.  Add 2 additional days to program exercise sessions.  Add 2 additional days to program exercise sessions.   Initial Home Exercises Provided  -  04/29/17  04/29/17  04/29/17  04/29/17   Row Name 07/03/17 1100             Response to Exercise   Blood Pressure (Admit)  118/60       Blood Pressure (Exercise)  110/56       Blood Pressure (Exit)  104/60       Heart Rate (Admit)  75 bpm       Heart Rate (Exercise)  90 bpm       Heart Rate (Exit)  62 bpm       Symptoms  none       Duration  Continue with 30 min of aerobic exercise without signs/symptoms of physical distress.       Intensity  THRR unchanged         Progression   Progression  Continue to progress workloads to maintain intensity without signs/symptoms of physical distress.         Resistance Training   Training Prescription  Yes       Weight  2lb        Reps  10-15       Time  10 Minutes          Recumbant Bike   Level  3       Watts  12       Minutes  15       METs  1.9         NuStep   Level  3       SPM  70       Minutes  15       METs  1.5         Home Exercise Plan   Plans to continue exercise at  Home (comment)       Frequency  Add 2 additional days to program exercise sessions.       Initial Home Exercises Provided  04/29/17          Exercise Comments: Exercise Comments    Row Name 04/22/17 1345 05/06/17 1643 05/27/17 1103 06/24/17 1217     Exercise Comments  Pt oriented to exercise equipment today. Pt did well with exercise prescription. Will continue to monitor pt's progress and activity levels.   Reviewed METS and goals. Pt is responding well to exercise program/prescription. Recently reviewed HEP, and will continue to monitor pt's fitness and activity levels.   Reviewed METS and goals. Pt is responding well to exercise program/prescription. Recently reviewed HEP, and will continue to monitor pt's fitness and activity levels.   Reviewed METS and goals. Pt is responding well to exercise program/prescription. Recently reviewed HEP, and will continue to monitor pt's fitness and activity levels.        Exercise  Goals and Review: Exercise Goals    Row Name 04/11/17 1014             Exercise Goals   Increase Physical Activity  Yes       Intervention  Provide advice, education, support and counseling about physical activity/exercise needs.;Develop an individualized exercise prescription for aerobic and resistive training based on initial evaluation findings, risk stratification, comorbidities and participant's personal goals.       Expected Outcomes  Achievement of increased cardiorespiratory fitness and enhanced flexibility, muscular endurance and strength shown through measurements of functional capacity and personal statement of participant.       Increase Strength and Stamina  Yes Be able to exercise and walk long distances without SOB/CP        Intervention  Develop an individualized exercise prescription for aerobic and resistive training based on initial evaluation findings, risk stratification, comorbidities and participant's personal goals.;Provide advice, education, support and counseling about physical activity/exercise needs.       Expected Outcomes  Achievement of increased cardiorespiratory fitness and enhanced flexibility, muscular endurance and strength shown through measurements of functional capacity and personal statement of participant.       Able to understand and use rate of perceived exertion (RPE) scale  Yes       Intervention  Provide education and explanation on how to use RPE scale       Expected Outcomes  Short Term: Able to use RPE daily in rehab to express subjective intensity level;Long Term:  Able to use RPE to guide intensity level when exercising independently       Knowledge and understanding of Target Heart Rate Range (THRR)  Yes       Intervention  Provide education and explanation of THRR including how the numbers were predicted and where they are located for reference       Expected Outcomes  Short Term: Able to state/look up THRR;Long Term: Able to use THRR to govern intensity when exercising independently;Short Term: Able to use daily as guideline for intensity in rehab       Able to check pulse independently  Yes       Intervention  Provide education and demonstration on how to check pulse in carotid and radial arteries.;Review the importance of being able to check your own pulse for safety during independent exercise       Expected Outcomes  Short Term: Able to explain why pulse checking is important during independent exercise;Long Term: Able to check pulse independently and accurately       Understanding of Exercise Prescription  Yes       Intervention  Provide education, explanation, and written materials on patient's individual exercise prescription       Expected Outcomes  Short Term: Able to explain  program exercise prescription;Long Term: Able to explain home exercise prescription to exercise independently          Exercise Goals Re-Evaluation : Exercise Goals Re-Evaluation    Row Name 04/29/17 0946 05/06/17 1644 05/27/17 1100 06/24/17 1217       Exercise Goal Re-Evaluation   Exercise Goals Review  Increase Physical Activity;Able to understand and use rate of perceived exertion (RPE) scale;Knowledge and understanding of Target Heart Rate Range (THRR);Understanding of Exercise Prescription;Increase Strength and Stamina;Able to check pulse independently  Increase Physical Activity;Able to understand and use rate of perceived exertion (RPE) scale;Knowledge and understanding of Target Heart Rate Range (THRR);Understanding of Exercise Prescription;Increase Strength and Stamina;Able to check pulse independently  Increase Physical Activity;Able to understand and use rate of perceived exertion (RPE) scale;Knowledge and understanding of Target Heart Rate Range (THRR);Understanding of Exercise Prescription;Increase Strength and Stamina;Able to check pulse independently  Increase Physical Activity;Able to understand and use rate of perceived exertion (RPE) scale;Knowledge and understanding of Target Heart Rate Range (THRR);Understanding of Exercise Prescription;Increase Strength and Stamina;Able to check pulse independently    Comments  Reviewed home exercise with pt today.  Pt plans to do seated chair exercise to increase strength and core. Pt will do exercises 2x/week in addition to coming to cardiac rehab  Reviewed THR, pulse, RPE, sign and symptoms, NTG use, and when to call 911 or MD.  Also discussed weather considerations and indoor options.  Pt voiced understanding.  Pt has increase exercise tolerance and walking capacity. Pt initially started with 5 laps of walking and is now up to 9 laps. Pt has c/o of anginal like symptoms with walking uphill and incline. Informed cardiology in which she will meet  for further evaluation on 05/07/17. Will f/u.  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 Pt modified performance in which she " lip sync" for the remainder of the concert. Pt stated that she is able to enjoy activities but takes breaks every now and then.   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 Pt made mention of fatigue and SOB. Discussed activity limitations, and taking breaks. Pt voiced understanding.    Expected Outcomes  Pt will be compliant with HEP and improve in cardiorespiratory fitness  Pt will be compliant with HEP and improve in cardiorespiratory fitness  Pt will continue to improve in cardiorespiratory fitness and continue to get back into activities while understanding limitations  Pt will continue to improve in cardiorespiratory fitness and continue to get back into activities while understanding limitations        Discharge Exercise Prescription (Final Exercise Prescription Changes): Exercise Prescription Changes - 07/03/17 1100      Response to Exercise   Blood Pressure (Admit)  118/60    Blood Pressure (Exercise)  110/56    Blood Pressure (Exit)  104/60    Heart Rate (Admit)  75 bpm    Heart Rate (Exercise)  90 bpm    Heart Rate (Exit)  62 bpm    Symptoms  none    Duration  Continue with 30 min of aerobic exercise without signs/symptoms of physical distress.    Intensity  THRR unchanged      Progression   Progression  Continue to progress workloads to maintain intensity without signs/symptoms of physical distress.      Resistance Training   Training Prescription  Yes    Weight  2lb     Reps  10-15    Time  10 Minutes      Recumbant Bike   Level  3    Watts  12    Minutes  15    METs  1.9      NuStep   Level  3    SPM  70    Minutes  15    METs  1.5      Home Exercise Plan   Plans to continue exercise at  Home (comment)    Frequency  Add 2 additional days to program exercise sessions.    Initial Home  Exercises Provided  04/29/17       Nutrition:  Target Goals: Understanding of nutrition guidelines, daily intake  of sodium 1500mg , cholesterol 200mg , calories 30% from fat and 7% or less from saturated fats, daily to have 5 or more servings of fruits and vegetables.  Biometrics:    Nutrition Therapy Plan and Nutrition Goals: Nutrition Therapy & Goals - 04/11/17 1113      Nutrition Therapy   Diet  Therapeutic Lifestyle Changes      Personal Nutrition Goals   Nutrition Goal  Pt to maintain her current wt while in Cardiac Rehab.       Intervention Plan   Intervention  Prescribe, educate and counsel regarding individualized specific dietary modifications aiming towards targeted core components such as weight, hypertension, lipid management, diabetes, heart failure and other comorbidities.    Expected Outcomes  Short Term Goal: Understand basic principles of dietary content, such as calories, fat, sodium, cholesterol and nutrients.;Long Term Goal: Adherence to prescribed nutrition plan.       Nutrition Assessments: Nutrition Assessments - 04/11/17 1113      MEDFICTS Scores   Pre Score  31       Nutrition Goals Re-Evaluation:   Nutrition Goals Re-Evaluation:   Nutrition Goals Discharge (Final Nutrition Goals Re-Evaluation):   Psychosocial: Target Goals: Acknowledge presence or absence of significant depression and/or stress, maximize coping skills, provide positive support system. Participant is able to verbalize types and ability to use techniques and skills needed for reducing stress and depression.  Initial Review & Psychosocial Screening: Initial Psych Review & Screening - 04/11/17 1245      Initial Review   Current issues with  None Identified      Family Dynamics   Good Support System?  Yes    Comments  Pt husband, former participant in cardiac rehab is supportive of pt rehab       Barriers   Psychosocial barriers to participate in program  The patient  should benefit from training in stress management and relaxation.      Screening Interventions   Interventions  Encouraged to exercise       Quality of Life Scores: Quality of Life - 04/24/17 1418      Quality of Life Scores   Health/Function Pre  27.1 %    Socioeconomic Pre  28.07 %    Psych/Spiritual Pre  29.14 %    Family Pre  27.6 %    GLOBAL Pre  27.79 % QOL reviewed with pt.  overall scores very good. pt exhibits positive outlook with good coping skills. pt expresses gratitude for her many blessings.      Scores of 19 and below usually indicate a poorer quality of life in these areas.  A difference of  2-3 points is a clinically meaningful difference.  A difference of 2-3 points in the total score of the Quality of Life Index has been associated with significant improvement in overall quality of life, self-image, physical symptoms, and general health in studies assessing change in quality of life.  PHQ-9: Recent Review Flowsheet Data    Depression screen [facility] 2/9 04/22/2017   Decreased Interest 0   Down, Depressed, Hopeless 0   PHQ - 2 Score 0     Interpretation of Total Score  Total Score Depression Severity:  1-4 = Minimal depression, 5-9 = Mild depression, 10-14 = Moderate depression, 15-19 = Moderately severe depression, 20-27 = Severe depression   Psychosocial Evaluation and Intervention: Psychosocial Evaluation - 04/22/17 1108      Psychosocial Evaluation & Interventions   Interventions  Encouraged to exercise with the program and  follow exercise prescription    Comments  no psychosocial needs identified, no interventions necessary     Expected Outcomes  pt will exhibit positive outlook with good coping skills.     Continue Psychosocial Services   No Follow up required       Psychosocial Re-Evaluation: Psychosocial Re-Evaluation    [place] Name 05/07/17 1619 05/28/17 1519 06/28/17 1437         Psychosocial Re-Evaluation   Current issues with  None Identified   None Identified  None Identified     Comments  no psychosocial needs identified, no interventions necessary   no psychosocial needs identified, no interventions necessary   no psychosocial needs identified, no interventions necessary      Expected Outcomes  pt will exhibit positive outlook with good coping skills.   pt will exhibit positive outlook with good coping skills.   pt will exhibit positive outlook with good coping skills.      Interventions  Encouraged to attend Cardiac Rehabilitation for the exercise  Encouraged to attend Cardiac Rehabilitation for the exercise  Encouraged to attend Cardiac Rehabilitation for the exercise        Psychosocial Discharge (Final Psychosocial Re-Evaluation): Psychosocial Re-Evaluation - 06/28/17 1437      Psychosocial Re-Evaluation   Current issues with  None Identified    Comments  no psychosocial needs identified, no interventions necessary     Expected Outcomes  pt will exhibit positive outlook with good coping skills.     Interventions  Encouraged to attend Cardiac Rehabilitation for the exercise       Vocational Rehabilitation: Provide vocational rehab assistance to qualifying candidates.   Vocational Rehab Evaluation & Intervention: Vocational Rehab - 04/11/17 1246      Initial Vocational Rehab Evaluation & Intervention   Assessment shows need for Vocational Rehabilitation  No       Education: Education Goals: Education classes will be provided on a weekly basis, covering required topics. Participant will state understanding/return demonstration of topics presented.  Learning Barriers/Preferences: Learning Barriers/Preferences - 04/11/17 1013      Learning Barriers/Preferences   Learning Barriers  Sight    Learning Preferences  Written Material;Video;Pictoral       Education Topics: Count Your Pulse:  -Group instruction provided by verbal instruction, demonstration, patient participation and written materials to support subject.   Instructors address importance of being able to find your pulse and how to count your pulse when at home without a heart monitor.  Patients get hands on experience counting their pulse with staff help and individually.   Heart Attack, Angina, and Risk Factor Modification:  -Group instruction provided by verbal instruction, video, and written materials to support subject.  Instructors address signs and symptoms of angina and heart attacks.    Also discuss risk factors for heart disease and how to make changes to improve heart health risk factors.   CARDIAC REHAB PHASE II EXERCISE from 06/28/2017 in [facility]  Date  06/12/17  Instruction Review Code  2- meets goals/outcomes      Functional Fitness:  -Group instruction provided by verbal instruction, demonstration, patient participation, and written materials to support subject.  Instructors address safety measures for doing things around the house.  Discuss how to get up and down off the floor, how to pick things up properly, how to safely get out of a chair without assistance, and balance training.   CARDIAC REHAB PHASE II EXERCISE from 06/28/2017 in [facility]  CARDIAC REHAB  Date  04/26/17  Instruction Review Code  2- meets goals/outcomes      Meditation and Mindfulness:  -Group instruction provided by verbal instruction, patient participation, and written materials to support subject.  Instructor addresses importance of mindfulness and meditation practice to help reduce stress and improve awareness.  Instructor also leads participants through a meditation exercise.    CARDIAC REHAB PHASE II EXERCISE from 06/28/2017 in [place]  Date  05/15/17  Instruction Review Code  2- meets goals/outcomes      Stretching for Flexibility and Mobility:  -Group instruction provided by verbal instruction, patient participation, and written materials to support subject.   Instructors lead participants through series of stretches that are designed to increase flexibility thus improving mobility.  These stretches are additional exercise for major muscle groups that are typically performed during regular warm up and cool down.   Hands Only CPR:  -Group verbal, video, and participation provides a basic overview of AHA guidelines for community CPR. Role-play of emergencies allow participants the opportunity to practice calling for help and chest compression technique with discussion of AED use.   Hypertension: -Group verbal and written instruction that provides a basic overview of hypertension including the most recent diagnostic guidelines, risk factor reduction with self-care instructions and medication management.   CARDIAC REHAB PHASE II EXERCISE from 06/28/2017 in [place]  Date  06/21/17  Instruction Review Code  2- meets goals/outcomes       Nutrition I class: Heart Healthy Eating:  -Group instruction provided by PowerPoint slides, verbal discussion, and written materials to support subject matter. The instructor gives an explanation and review of the Therapeutic Lifestyle Changes diet recommendations, which includes a discussion on lipid goals, dietary fat, sodium, fiber, plant stanol/sterol esters, sugar, and the components of a well-balanced, healthy diet.   Nutrition II class: Lifestyle Skills:  -Group instruction provided by PowerPoint slides, verbal discussion, and written materials to support subject matter. The instructor gives an explanation and review of label reading, grocery shopping for heart health, heart healthy recipe modifications, and ways to make healthier choices when eating out.   Diabetes Question & Answer:  -Group instruction provided by PowerPoint slides, verbal discussion, and written materials to support subject matter. The instructor gives an explanation and review of diabetes co-morbidities, pre-  and post-prandial blood glucose goals, pre-exercise blood glucose goals, signs, symptoms, and treatment of hypoglycemia and hyperglycemia, and foot care basics.   CARDIAC REHAB PHASE II EXERCISE from 06/28/2017 in [place]  Date  06/28/17  Educator  RD  Instruction Review Code  2- meets goals/outcomes      Diabetes Blitz:  -Group instruction provided by PowerPoint slides, verbal discussion, and written materials to support subject matter. The instructor gives an explanation and review of the physiology behind type 1 and type 2 diabetes, diabetes medications and rational behind using different medications, pre- and post-prandial blood glucose recommendations and Hemoglobin A1c goals, diabetes diet, and exercise including blood glucose guidelines for exercising safely.    Portion Distortion:  -Group instruction provided by PowerPoint slides, verbal discussion, written materials, and food models to support subject matter. The instructor gives an explanation of serving size versus portion size, changes in portions sizes over the last 20 years, and what consists of a serving from each food group.   Stress Management:  -Group instruction provided by verbal instruction, video, and written materials to support subject matter.  Instructors review role of stress in heart disease and how to cope with stress positively.     CARDIAC REHAB PHASE II EXERCISE from 06/28/2017 in [place]  Date  06/05/17  Instruction Review Code  2- meets goals/outcomes      Exercising on Your Own:  -Group instruction provided by verbal instruction, power point, and written materials to support subject.  Instructors discuss benefits of exercise, components of exercise, frequency and intensity of exercise, and end points for exercise.  Also discuss use of nitroglycerin and activating EMS.  Review options of places to exercise outside of rehab.  Review guidelines  for sex with heart disease.   CARDIAC REHAB PHASE II EXERCISE from 06/28/2017 in [place]  Date  06/26/17  Instruction Review Code  2- meets goals/outcomes      Cardiac Drugs I:  -Group instruction provided by verbal instruction and written materials to support subject.  Instructor reviews cardiac drug classes: antiplatelets, anticoagulants, beta blockers, and statins.  Instructor discusses reasons, side effects, and lifestyle considerations for each drug class.   CARDIAC REHAB PHASE II EXERCISE from 06/28/2017 in [place]  Date  06/19/17  Instruction Review Code  2- meets goals/outcomes      Cardiac Drugs II:  -Group instruction provided by verbal instruction and written materials to support subject.  Instructor reviews cardiac drug classes: angiotensin converting enzyme inhibitors (ACE-I), angiotensin II receptor blockers (ARBs), nitrates, and calcium channel blockers.  Instructor discusses reasons, side effects, and lifestyle considerations for each drug class.   Anatomy and Physiology of the Circulatory System:  Group verbal and written instruction and models provide basic cardiac anatomy and physiology, with the coronary electrical and arterial systems. Review of: AMI, Angina, Valve disease, Heart Failure, Peripheral Artery Disease, Cardiac Arrhythmia, Pacemakers, and the ICD.   CARDIAC REHAB PHASE II EXERCISE from 06/28/2017 in [place]  Date  05/08/17  Instruction Review Code  2- meets goals/outcomes      Other Education:  -Group or individual verbal, written, or video instructions that support the educational goals of the cardiac rehab program.   Knowledge Questionnaire Score: Knowledge Questionnaire Score - 04/11/17 1149      Knowledge Questionnaire Score   Pre Score  19/24       Core Components/Risk Factors/Patient Goals at Admission: Personal Goals and Risk Factors at  Admission - 04/11/17 1155      Core Components/Risk Factors/Patient Goals on Admission   Admit Weight  139 lb 5.3 oz (63.2 kg)    Goal Weight: Short Term  139 lb (63 kg)    Goal Weight: Long Term  139 lb (63 kg)       Core Components/Risk Factors/Patient Goals Review:  Goals and Risk Factor Review    Row Name 05/07/17 1617 05/28/17 1519 06/28/17 1434         Core Components/Risk Factors/Patient Goals Review   Personal Goals Review  Weight Management/Obesity;Improve shortness of breath with ADL's;Lipids  Weight Management/Obesity;Improve shortness of breath with ADL's;Lipids  Weight Management/Obesity;Improve shortness of breath with ADL's;Lipids     Review  pt making proactive efforts to decrease CAD RF and symptoms. pt actively participating in exercise and education offerings. pt has been walking at home with less angina.   pt making proactive efforts to decrease CAD RF and symptoms. pt actively participating in exercise and education offerings. pt has been walking at home with less angina.  pt making proactive efforts to decrease CAD RF and symptoms. pt actively participating in exercise and education offerings. pt has been walking at home with less angina.  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 pt pleased she has tolerated this well.       Expected Outcomes  pt will participate in CR exercise, nutrition and lifestyle modification education to reduce overall RF.   pt will participate in CR exercise, nutrition and lifestyle modification education to reduce overall RF.   pt will participate in CR exercise, nutrition and lifestyle modification education to reduce overall RF.         Core Components/Risk Factors/Patient Goals at Discharge (Final Review):  Goals and Risk Factor Review - 06/28/17 1434      Core Components/Risk Factors/Patient Goals Review   Personal Goals Review  Weight Management/Obesity;Improve shortness of breath with ADL's;Lipids    Review  pt  making proactive efforts to decrease CAD RF and symptoms. pt actively participating in exercise and education offerings. pt has been walking at home with less angina.  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 pt pleased she has tolerated this well.      Expected Outcomes  pt will participate in CR exercise, nutrition and lifestyle modification education to reduce overall RF.        ITP Comments: ITP Comments    Row Name 04/11/17 0932 04/22/17 1108 05/07/17 1616 05/28/17 1519 06/28/17 1433   ITP Comments  Dr Fransico Him, Medical Director  pt tolerated first group exercise session without difficulty.  30 day ITP review.  pt demonstrates eagerness to participate in group exercise.  pt with good attendance and participation.   30 day ITP review.  pt demonstrates eagerness to participate in group exercise.  pt with good attendance and participation.   30 day ITP review.  pt demonstrates eagerness to participate in group exercise.  pt with good attendance and participation.       Comments:

## 2017-07-05 ENCOUNTER — Encounter (HOSPITAL_COMMUNITY): Payer: Medicare Other

## 2017-07-05 ENCOUNTER — Encounter (HOSPITAL_COMMUNITY)
Admission: RE | Admit: 2017-07-05 | Discharge: 2017-07-05 | Disposition: A | Discharge status: Home / Self Care | Payer: Medicare Other | Source: Ambulatory Visit | Attending: Interventional Cardiology | Admitting: Interventional Cardiology

## 2017-07-05 DIAGNOSIS — I4891 Unspecified atrial fibrillation: Secondary | ICD-10-CM | POA: Diagnosis not present

## 2017-07-05 DIAGNOSIS — I2510 Atherosclerotic heart disease of native coronary artery without angina pectoris: Secondary | ICD-10-CM | POA: Diagnosis not present

## 2017-07-05 DIAGNOSIS — N183 Chronic kidney disease, stage 3 (moderate): Secondary | ICD-10-CM | POA: Diagnosis not present

## 2017-07-05 DIAGNOSIS — M8580 Other specified disorders of bone density and structure, unspecified site: Secondary | ICD-10-CM | POA: Diagnosis not present

## 2017-07-05 DIAGNOSIS — Z955 Presence of coronary angioplasty implant and graft: Secondary | ICD-10-CM

## 2017-07-05 DIAGNOSIS — Z79899 Other long term (current) drug therapy: Secondary | ICD-10-CM | POA: Diagnosis not present

## 2017-07-05 DIAGNOSIS — Z951 Presence of aortocoronary bypass graft: Secondary | ICD-10-CM | POA: Diagnosis not present

## 2017-07-08 ENCOUNTER — Encounter (HOSPITAL_COMMUNITY)
Admission: RE | Admit: 2017-07-08 | Discharge: 2017-07-08 | Disposition: A | Discharge status: Home / Self Care | Payer: Medicare Other | Source: Ambulatory Visit | Attending: Interventional Cardiology | Admitting: Interventional Cardiology

## 2017-07-08 ENCOUNTER — Telehealth: Payer: Self-pay | Admitting: *Deleted

## 2017-07-08 DIAGNOSIS — Z955 Presence of coronary angioplasty implant and graft: Secondary | ICD-10-CM

## 2017-07-08 DIAGNOSIS — I480 Paroxysmal atrial fibrillation: Secondary | ICD-10-CM

## 2017-07-08 DIAGNOSIS — I2510 Atherosclerotic heart disease of native coronary artery without angina pectoris: Secondary | ICD-10-CM | POA: Diagnosis not present

## 2017-07-08 DIAGNOSIS — I4891 Unspecified atrial fibrillation: Secondary | ICD-10-CM | POA: Diagnosis not present

## 2017-07-08 DIAGNOSIS — Z951 Presence of aortocoronary bypass graft: Secondary | ICD-10-CM | POA: Diagnosis not present

## 2017-07-08 DIAGNOSIS — M8580 Other specified disorders of bone density and structure, unspecified site: Secondary | ICD-10-CM | POA: Diagnosis not present

## 2017-07-08 DIAGNOSIS — Z79899 Other long term (current) drug therapy: Secondary | ICD-10-CM | POA: Diagnosis not present

## 2017-07-08 DIAGNOSIS — N183 Chronic kidney disease, stage 3 (moderate): Secondary | ICD-10-CM | POA: Diagnosis not present

## 2017-07-08 NOTE — Telephone Encounter (Signed)
Event monitor

## 2017-07-08 NOTE — Telephone Encounter (Signed)
-----   Message from Lowell Guitar, RN sent at 07/08/2017  9:53 AM EST ----- Regarding: cardiac rehab  Dear Sherry Barnett,  Pt had brief episode of jaw and neck tightness radiating to chest pressure while walking track at cardiac rehab. Pt rates 3/10.  Quickly relieved with sitting. Pt has not had anginal symptoms at rehab or home  in several weeks. BP:  120/60, HR: 91 with exercise.  Pt weight up 1.3kg (64.7kg) with trace pedal edema, lungs clear, no dyspnea.  Pt also reporting more frequent afib episodes at night.  She describes as brief nonsustained events that occur when she is awakened at night to use the restroom. They usually occur 2-3 x weekly.  No afib has been documented at cardiac rehab.     All these symptoms are persistent for her.  However they are causing some health related anxiety.  Her next scheduled appointment with Dr. Tamala Julian is in March.  Would it be possible for her to be seen sooner?  He mentioned event monitor in his last note.  She is not on anticoagulant.    Thank you, Andi Hence, RN, BSN Cardiac Pulmonary Rehab

## 2017-07-08 NOTE — Progress Notes (Signed)
OUTPATIENT CARDIAC REHAB  PMH:  DES [name]  Primary Cardiologist: Dr. Tamala Julian  Pt had brief episode of jaw and neck tightness radiating to chest pressure while walking track at cardiac rehab. Pt rates 3/10. Quickly relieved with sitting. Pt has not had anginal symptoms at rehab or home in several weeks. BP: 120/60, HR: 91 with exercise. Pt weight up 1.3kg (64.7kg) with trace pedal edema, lungs clear, no dyspnea. Pt did eat out this weekend.   Pt also reporting more frequent afib episodes at night. She describes as brief nonsustained events that occur when she is awakened at night to use the restroom. They usually occur 2-3 x weekly. No afib has been documented at cardiac rehab.     All these symptoms are persistent for her. However they are causing some health related anxiety. Her next scheduled appointment with Dr. Tamala Julian is in March. Phone call and message sent to Dr. Thompson Caul nurse to request earlier appointment.  He mentioned event monitor in his last note. She is not on anticoagulant.   Andi Hence, RN, BSN Cardiac Pulmonary Rehab 07/08/17  10:06 AM

## 2017-07-08 NOTE — Telephone Encounter (Signed)
Spoke with pt and made her aware of Dr. Thompson Caul recommendation.  Pt in agreement to wear monitor.  Advised I would place order and have scheduler give her a call to schedule.  Pt appreciative for call.

## 2017-07-08 NOTE — Telephone Encounter (Signed)
Will route to Dr. Tamala Julian to see if he wants pt to come in for visit or go ahead and place event monitor?

## 2017-07-10 ENCOUNTER — Encounter (HOSPITAL_COMMUNITY)
Admission: RE | Admit: 2017-07-10 | Discharge: 2017-07-10 | Disposition: A | Discharge status: Home / Self Care | Payer: Medicare Other | Source: Ambulatory Visit | Attending: Interventional Cardiology | Admitting: Interventional Cardiology

## 2017-07-10 DIAGNOSIS — M8580 Other specified disorders of bone density and structure, unspecified site: Secondary | ICD-10-CM | POA: Diagnosis not present

## 2017-07-10 DIAGNOSIS — Z79899 Other long term (current) drug therapy: Secondary | ICD-10-CM | POA: Diagnosis not present

## 2017-07-10 DIAGNOSIS — I2510 Atherosclerotic heart disease of native coronary artery without angina pectoris: Secondary | ICD-10-CM | POA: Diagnosis not present

## 2017-07-10 DIAGNOSIS — Z955 Presence of coronary angioplasty implant and graft: Secondary | ICD-10-CM

## 2017-07-10 DIAGNOSIS — Z951 Presence of aortocoronary bypass graft: Secondary | ICD-10-CM | POA: Diagnosis not present

## 2017-07-10 DIAGNOSIS — N183 Chronic kidney disease, stage 3 (moderate): Secondary | ICD-10-CM | POA: Diagnosis not present

## 2017-07-10 DIAGNOSIS — I4891 Unspecified atrial fibrillation: Secondary | ICD-10-CM | POA: Diagnosis not present

## 2017-07-12 ENCOUNTER — Encounter (HOSPITAL_COMMUNITY)
Admission: RE | Admit: 2017-07-12 | Discharge: 2017-07-12 | Disposition: A | Discharge status: Home / Self Care | Payer: Medicare Other | Source: Ambulatory Visit | Attending: Interventional Cardiology | Admitting: Interventional Cardiology

## 2017-07-12 DIAGNOSIS — N183 Chronic kidney disease, stage 3 (moderate): Secondary | ICD-10-CM | POA: Insufficient documentation

## 2017-07-12 DIAGNOSIS — Z79899 Other long term (current) drug therapy: Secondary | ICD-10-CM | POA: Insufficient documentation

## 2017-07-12 DIAGNOSIS — Z87891 Personal history of nicotine dependence: Secondary | ICD-10-CM | POA: Diagnosis not present

## 2017-07-12 DIAGNOSIS — Z951 Presence of aortocoronary bypass graft: Secondary | ICD-10-CM | POA: Diagnosis not present

## 2017-07-12 DIAGNOSIS — I4891 Unspecified atrial fibrillation: Secondary | ICD-10-CM | POA: Diagnosis not present

## 2017-07-12 DIAGNOSIS — I2510 Atherosclerotic heart disease of native coronary artery without angina pectoris: Secondary | ICD-10-CM | POA: Insufficient documentation

## 2017-07-12 DIAGNOSIS — M8580 Other specified disorders of bone density and structure, unspecified site: Secondary | ICD-10-CM | POA: Insufficient documentation

## 2017-07-12 DIAGNOSIS — Z955 Presence of coronary angioplasty implant and graft: Secondary | ICD-10-CM

## 2017-07-15 ENCOUNTER — Ambulatory Visit (INDEPENDENT_AMBULATORY_CARE_PROVIDER_SITE_OTHER): Payer: Medicare Other

## 2017-07-15 ENCOUNTER — Encounter (HOSPITAL_COMMUNITY)
Admission: RE | Admit: 2017-07-15 | Discharge: 2017-07-15 | Disposition: A | Discharge status: Home / Self Care | Payer: Medicare Other | Source: Ambulatory Visit | Attending: Interventional Cardiology | Admitting: Interventional Cardiology

## 2017-07-15 DIAGNOSIS — Z79899 Other long term (current) drug therapy: Secondary | ICD-10-CM | POA: Diagnosis not present

## 2017-07-15 DIAGNOSIS — I2510 Atherosclerotic heart disease of native coronary artery without angina pectoris: Secondary | ICD-10-CM | POA: Diagnosis not present

## 2017-07-15 DIAGNOSIS — N183 Chronic kidney disease, stage 3 (moderate): Secondary | ICD-10-CM | POA: Diagnosis not present

## 2017-07-15 DIAGNOSIS — Z955 Presence of coronary angioplasty implant and graft: Secondary | ICD-10-CM

## 2017-07-15 DIAGNOSIS — M8580 Other specified disorders of bone density and structure, unspecified site: Secondary | ICD-10-CM | POA: Diagnosis not present

## 2017-07-15 DIAGNOSIS — I480 Paroxysmal atrial fibrillation: Secondary | ICD-10-CM | POA: Diagnosis not present

## 2017-07-15 DIAGNOSIS — Z951 Presence of aortocoronary bypass graft: Secondary | ICD-10-CM | POA: Diagnosis not present

## 2017-07-15 DIAGNOSIS — I4891 Unspecified atrial fibrillation: Secondary | ICD-10-CM | POA: Diagnosis not present

## 2017-07-17 ENCOUNTER — Encounter: Payer: Self-pay | Admitting: Interventional Cardiology

## 2017-07-17 ENCOUNTER — Emergency Department (HOSPITAL_COMMUNITY)
Admission: EM | Admit: 2017-07-17 | Discharge: 2017-07-17 | Disposition: A | Discharge status: Home / Self Care | Payer: Medicare Other | Attending: Emergency Medicine | Admitting: Emergency Medicine

## 2017-07-17 ENCOUNTER — Other Ambulatory Visit: Payer: Self-pay

## 2017-07-17 ENCOUNTER — Ambulatory Visit (INDEPENDENT_AMBULATORY_CARE_PROVIDER_SITE_OTHER): Payer: Medicare Other | Admitting: Interventional Cardiology

## 2017-07-17 ENCOUNTER — Emergency Department (HOSPITAL_COMMUNITY): Payer: Medicare Other

## 2017-07-17 ENCOUNTER — Telehealth: Payer: Self-pay | Admitting: Interventional Cardiology

## 2017-07-17 ENCOUNTER — Encounter (HOSPITAL_COMMUNITY): Payer: Self-pay | Admitting: Emergency Medicine

## 2017-07-17 ENCOUNTER — Encounter (HOSPITAL_COMMUNITY): Payer: Medicare Other

## 2017-07-17 VITALS — BP 128/70 | HR 62 | Ht 63.0 in | Wt 140.0 lb

## 2017-07-17 DIAGNOSIS — Z955 Presence of coronary angioplasty implant and graft: Secondary | ICD-10-CM | POA: Diagnosis not present

## 2017-07-17 DIAGNOSIS — I25119 Atherosclerotic heart disease of native coronary artery with unspecified angina pectoris: Secondary | ICD-10-CM | POA: Diagnosis not present

## 2017-07-17 DIAGNOSIS — N183 Chronic kidney disease, stage 3 (moderate): Secondary | ICD-10-CM | POA: Diagnosis not present

## 2017-07-17 DIAGNOSIS — I129 Hypertensive chronic kidney disease with stage 1 through stage 4 chronic kidney disease, or unspecified chronic kidney disease: Secondary | ICD-10-CM | POA: Insufficient documentation

## 2017-07-17 DIAGNOSIS — Z7982 Long term (current) use of aspirin: Secondary | ICD-10-CM | POA: Insufficient documentation

## 2017-07-17 DIAGNOSIS — E785 Hyperlipidemia, unspecified: Secondary | ICD-10-CM | POA: Diagnosis not present

## 2017-07-17 DIAGNOSIS — I209 Angina pectoris, unspecified: Secondary | ICD-10-CM

## 2017-07-17 DIAGNOSIS — R079 Chest pain, unspecified: Secondary | ICD-10-CM | POA: Diagnosis not present

## 2017-07-17 DIAGNOSIS — Z79899 Other long term (current) drug therapy: Secondary | ICD-10-CM | POA: Insufficient documentation

## 2017-07-17 DIAGNOSIS — R0602 Shortness of breath: Secondary | ICD-10-CM | POA: Diagnosis not present

## 2017-07-17 DIAGNOSIS — I480 Paroxysmal atrial fibrillation: Secondary | ICD-10-CM

## 2017-07-17 DIAGNOSIS — R0789 Other chest pain: Secondary | ICD-10-CM | POA: Diagnosis not present

## 2017-07-17 DIAGNOSIS — Z87891 Personal history of nicotine dependence: Secondary | ICD-10-CM | POA: Diagnosis not present

## 2017-07-17 DIAGNOSIS — I4891 Unspecified atrial fibrillation: Secondary | ICD-10-CM | POA: Diagnosis not present

## 2017-07-17 LAB — BASIC METABOLIC PANEL
Anion gap: 11 (ref 5–15)
BUN: 14 mg/dL (ref 6–20)
CO2: 23 mmol/L (ref 22–32)
CREATININE: 0.87 mg/dL (ref 0.44–1.00)
Calcium: 9 mg/dL (ref 8.9–10.3)
Chloride: 104 mmol/L (ref 101–111)
GFR calc non Af Amer: >60 mL/min (ref >60)
GLUCOSE: 115 mg/dL — AB (ref 65–99)
Potassium: 3.7 mmol/L (ref 3.5–5.1)
Sodium: 138 mmol/L (ref 135–145)

## 2017-07-17 LAB — CBC
HCT: 37.6 % (ref 36.0–46.0)
Hemoglobin: 12.3 g/dL (ref 12.0–15.0)
MCH: 30.4 pg (ref 26.0–34.0)
MCHC: 32.7 g/dL (ref 30.0–36.0)
MCV: 93.1 fL (ref 78.0–100.0)
PLATELETS: 187 10*3/uL (ref 150–400)
RBC: 4.04 MIL/uL (ref 3.87–5.11)
RDW: 13.9 % (ref 11.5–15.5)
WBC: 8.1 10*3/uL (ref 4.0–10.5)

## 2017-07-17 LAB — I-STAT TROPONIN, ED
TROPONIN I, POC: 0.01 ng/mL (ref 0.00–0.08)
Troponin i, poc: 0.01 ng/mL (ref 0.00–0.08)

## 2017-07-17 NOTE — Telephone Encounter (Signed)
Dr. Tamala Julian asked that pt be added to schedule.  Called pt and she states that at 1AM she had an episode of CP.  Took Nitro and continued to have pain so husband called 30.  They told pt to take 4 baby ASA.  Pt went to ED for eval.  Pt agreeable to come into office today at 3pm.

## 2017-07-17 NOTE — Telephone Encounter (Signed)
New message  Patient calling, she went to ED for chest pain on 07/16/17. Patient states she feels better today. Please call [phone]  Pt c/o of Chest Pain: STAT if CP now or developed within 24 hours  1. Are you having CP right now? NO  2. Are you experiencing any other symptoms (ex. SOB, nausea, vomiting, sweating)? NO  3. How long have you been experiencing CP? 1 DAY  4. Is your CP continuous or coming and going? COMING AND GOING  5. Have you taken Nitroglycerin? YES ?

## 2017-07-17 NOTE — ED Notes (Signed)
Patient ambulatory to bathroom with steady gait at this time 

## 2017-07-17 NOTE — ED Provider Notes (Signed)
[facility] EMERGENCY DEPARTMENT Provider Note   CSN: [id number] Arrival date & time: 07/17/17  0101     History   Chief Complaint Chief Complaint  Patient presents with  . Chest Pain    HPI Sherry Barnett is a 84 y.o. female.  The history is provided by the patient. No language interpreter was used.  Chest Pain     Sherry Barnett is a 84 y.o. female who presents to the Emergency Department complaining of chest pain.  He has a history of paroxysmal atrial fibrillation as well as coronary artery disease with stent placement in October 2018.  She was in her routine state of health when she woke at midnight with palpitations and a yellow feeling in her chest consistent with prior episodes of atrial fibrillation.  When she went to lay down she developed central chest pressure that radiated to her jaw.  She reports this is insignificant pain.  Her husband called 19 and she took 324 of aspirin as well as 1 nitroglycerin.  She had minimal improvement in her symptoms with the nitroglycerin.  Between pain onset and arrival to emergency department her pain has completely resolved.  She had associated shortness of breath.  No diaphoresis, abdominal pain, nausea, vomiting, leg swelling or pain.  No recent medication changes.  She currently is symptom-free. Past Medical History:  Diagnosis Date  . A-fib ([place])   . Bilateral cataracts   . Blepharitis    chronic  . CAD (coronary artery disease), native coronary artery    10/18 PCI/DES to p/mLAD, normal EF  . Cardiovascular risk factor    23%  . Degenerative joint disease (DJD) of lumbar spine   . Diverticulosis   . Endometriosis   . Fibroids   . Hematuria, microscopic [id number]  . Hemorrhoids   . Hypercholesterolemia    10 years  . Osteopenia 03/2016   start alendrodate  . Renal disease    stage2/stage 3    Patient Active Problem List   Diagnosis Date Noted  . Angina pectoris ([place]) 03/14/2017  . Hyperlipidemia LDL goal  <70 03/14/2017  . Paroxysmal atrial fibrillation ([place]) 03/14/2017  . H/O class III angina pectoris 03/14/2017    Past Surgical History:  Procedure Laterality Date  . APPENDECTOMY    . CATARACT EXTRACTION, BILATERAL Bilateral 2001 & 11/2016  . CORONARY STENT INTERVENTION N/A 03/14/2017   Procedure: CORONARY STENT INTERVENTION;  Surgeon: Belva Crome, MD;  Location: [facility];  Service: Cardiovascular;  Laterality: N/A;  . LEFT HEART CATH AND CORONARY ANGIOGRAPHY N/A 03/14/2017   Procedure: LEFT HEART CATH AND CORONARY ANGIOGRAPHY;  Surgeon: Belva Crome, MD;  Location: [facility];  Service: Cardiovascular;  Laterality: N/A;  . ovarian cysts Bilateral   . tubes removal Bilateral    endometriosis    OB History    No data available       Home Medications    Prior to Admission medications   Medication Sig Start Date End Date Taking? Authorizing Provider  alendronate (FOSAMAX) 70 MG tablet Take 70 mg by mouth every Saturday. Take with a full glass of water on an empty stomach.     [provider]  Artificial Tear Ointment (ARTIFICIAL TEARS) ointment Place 1 drop into both eyes as needed (dry eyes).     [provider]  aspirin 81 MG EC tablet Take 81 mg by mouth daily with breakfast. Swallow whole.    [provider]  atorvastatin (LIPITOR) 80 MG tablet TAKE 1 TABLET BY MOUTH EVERY DAY AT 6:00 PM 06/03/17   Belva Crome, MD  Azelaic Acid (FINACEA) 15 % cream Apply 1 application topically at bedtime. After skin is thoroughly washed and patted dry, gently but thoroughly massage a thin film of azelaic acid cream into the affected area twice daily, in the morning and evening.     [provider]  calcium carbonate (TUMS EX) 750 MG chewable tablet Chew 1 tablet by mouth daily.    [provider]  cholecalciferol (VITAMIN D) 1000 units tablet Take 1,000 Units by mouth daily.    [provider]  clopidogrel (PLAVIX) 75 MG  tablet Take 1 tablet (75 mg total) by mouth daily. 05/07/17   Belva Crome, MD  isosorbide mononitrate (IMDUR) 30 MG 24 hr tablet Take 1 tablet (30 mg total) by mouth daily. 04/11/17 04/06/18  Leanor Kail, PA  metoprolol succinate (TOPROL-XL) 25 MG 24 hr tablet Take 25 mg by mouth daily with breakfast.    [provider]  nitroGLYCERIN (NITROSTAT) 0.4 MG SL tablet Place 1 tablet (0.4 mg total) under the tongue every 5 (five) minutes as needed for chest pain. 02/26/17 05/27/17  Belva Crome, MD  pantoprazole (PROTONIX) 40 MG tablet Take 1 tablet (40 mg total) by mouth daily. 03/28/17   Bhagat, Crista Luria, PA  tobramycin-dexamethasone (TOBRADEX) ophthalmic ointment Place 1 application into both eyes See admin instructions. Tuesday AND Saturday patient does not use. Will apply at night on all other nights.    [provider]    Family History Family History  Problem Relation Age of Onset  . Heart attack Mother   . Alzheimer's disease Father   . Hypertension Father   . Stroke Father   . Other Sister 31       MVA    Social History Social History   Tobacco Use  . Smoking status: Former Smoker    Years: 5.00    Types: Cigarettes    Last attempt to quit: 02/06/1969    Years since quitting: 48.4  . Smokeless tobacco: Never Used  Substance Use Topics  . Alcohol use: No  . Drug use: No     Allergies   Sulfa antibiotics; Novocain [procaine]; and Penicillins   Review of Systems Review of Systems  Cardiovascular: Positive for chest pain.  All other systems reviewed and are negative.    Physical Exam Updated Vital Signs BP (!) 123/54 (BP Location: Right Arm)   Pulse 75   Temp 98.1 F (36.7 C)   Resp 14   SpO2 98%   Physical Exam  Constitutional: She is oriented to person, place, and time. She appears well-developed and well-nourished.  HENT:  Head: Normocephalic and atraumatic.  Cardiovascular: Normal rate and regular rhythm.  No murmur  heard. Pulmonary/Chest: Effort normal and breath sounds normal. No respiratory distress.  Abdominal: Soft. There is no tenderness. There is no rebound and no guarding.  Musculoskeletal: She exhibits no edema or tenderness.  Neurological: She is alert and oriented to person, place, and time.  Skin: Skin is warm and dry.  Psychiatric: She has a normal mood and affect. Her behavior is normal.  Nursing note and vitals reviewed.    ED Treatments / Results  Labs (all labs ordered are listed, but only abnormal results are displayed) Labs Reviewed  BASIC METABOLIC PANEL - Abnormal; Notable for the following components:      Result Value   Glucose, Bld  115 (*)    All other components within normal limits  CBC  I-STAT TROPONIN, ED  I-STAT TROPONIN, ED    EKG  EKG Interpretation  Date/Time:  Wednesday July 17 2017 01:10:25 EST Ventricular Rate:  70 PR Interval:    QRS Duration: 97 QT Interval:  424 QTC Calculation: 458 R Axis:   32 Text Interpretation:  Sinus rhythm Low voltage, precordial leads RSR' in V1 or V2, probably normal variant Nonspecific T abnormalities, lateral leads Confirmed by Quintella Reichert [phone]) on 07/17/2017 1:19:19 AM Also confirmed by Quintella Reichert [phone]), editor Hattie Perch [phone])  on 07/17/2017 7:13:48 AM       Radiology Dg Chest 2 View  Result Date: 07/17/2017 CLINICAL DATA:  Chest pain EXAM: CHEST  2 VIEW COMPARISON:  None. FINDINGS: The heart size and mediastinal contours are within normal limits. Both lungs are clear. The visualized skeletal structures are unremarkable. IMPRESSION: No active cardiopulmonary disease. Electronically Signed   By: Donavan Foil M.D.   On: 07/17/2017 01:52    Procedures Procedures (including critical care time)  Medications Ordered in ED Medications - No data to display   Initial Impression / Assessment and Plan / ED Course  I have reviewed the triage vital signs and the nursing notes.  Pertinent labs &  imaging results that were available during my care of the patient were reviewed by me and considered in my medical decision making (see chart for details).  Clinical Course as of Jul 17 825  Wed Jul 17, 2017  0213 D/w on-call Cardiologist - recommends repeat troponin and discharge with outpatient follow up if stable.     [ER]    Clinical Course User Index [ER] Quintella Reichert, MD    Patient with history of coronary artery disease with stenting several months ago here for evaluation of one episode of chest pain that is currently resolved.  She was observed in the emergency department and continued to be chest pain-free.  Delta troponin is negative.  Presentation is not consistent with ACS, PE, dissection, acute CHF, pneumonia.  Plan to DC home with outpatient follow-up and return precautions.  Final Clinical Impressions(s) / ED Diagnoses   Final diagnoses:  Nonspecific chest pain    ED Discharge Orders    None       Quintella Reichert, MD 07/17/17 [phone]

## 2017-07-17 NOTE — ED Triage Notes (Signed)
Patient from home with chest pain that awoke her from sleep.  Patient describes as a pressure in the center of her chest.  She took 324mg  ASA and one sl nitro at home.  Pain has subsided and shortness of breath has subsided.  Patient is wearing a holter monitor for Afib.

## 2017-07-17 NOTE — Patient Instructions (Signed)
Medication Instructions:  1) Please record the number of Nitro tablets you take between now and the next time you see [place].  Please only take one when you are having chest pain or pressure that lasts more than 3-5 minutes.  Labwork: None  Testing/Procedures: None  Follow-Up: Your physician recommends that you schedule a follow-up appointment in: 1 month with Dr. Tamala Julian.    Any Other Special Instructions Will Be Listed Below (If Applicable).     If you need a refill on your cardiac medications before your next appointment, please call your pharmacy.

## 2017-07-17 NOTE — Progress Notes (Signed)
Cardiology Office Note    Date:  07/17/2017   ID:  Sherry, Barnett May 09, 1933, MRN [id number]  PCP:  Lajean Manes, MD  Cardiologist: Sinclair Grooms, MD   Chief Complaint  Patient presents with  . Coronary Artery Disease  . Chest Pain    History of Present Illness:  Sherry Barnett is a 84 y.o. female exertional chest and jaw discomfort which upon evaluation with coronary angiography in October 2018 identified high-grade obstruction mid [name] treated with a drug-eluting stent, prior history of paroxysmal/episodic atrial fibrillation but no documenting data available and on no anticoagulation therapy.Sherry Barnett has had atypical intermittent chest pain.  The discomfort on this occasion led to an emergency room evaluation earlier this morning.  EKG was unremarkable.  She states that the episode started like she was having atrial fibrillation with fluttering in the chest.  Then she developed severe heaviness in the chest.  She eventually took 1 and then finally a second nitroglycerin and by the time they arrived at the emergency room no discomfort was present.   Past Medical History:  Diagnosis Date  . A-fib ([place])   . Bilateral cataracts   . Blepharitis    chronic  . CAD (coronary artery disease), native coronary artery    10/18 PCI/DES to p/mLAD, normal EF  . Cardiovascular risk factor    23%  . Degenerative joint disease (DJD) of lumbar spine   . Diverticulosis   . Endometriosis   . Fibroids   . Hematuria, microscopic [id number]  . Hemorrhoids   . Hypercholesterolemia    10 years  . Osteopenia 03/2016   start alendrodate  . Renal disease    stage2/stage 3    Past Surgical History:  Procedure Laterality Date  . APPENDECTOMY    . CATARACT EXTRACTION, BILATERAL Bilateral 2001 & 11/2016  . CORONARY STENT INTERVENTION N/A 03/14/2017   Procedure: CORONARY STENT INTERVENTION;  Surgeon: Belva Crome, MD;  Location: [facility];  Service: Cardiovascular;  Laterality: N/A;  .  LEFT HEART CATH AND CORONARY ANGIOGRAPHY N/A 03/14/2017   Procedure: LEFT HEART CATH AND CORONARY ANGIOGRAPHY;  Surgeon: Belva Crome, MD;  Location: [facility];  Service: Cardiovascular;  Laterality: N/A;  . ovarian cysts Bilateral   . tubes removal Bilateral    endometriosis    Current Medications: Outpatient Medications Prior to Visit  Medication Sig Dispense Refill  . alendronate (FOSAMAX) 70 MG tablet Take 70 mg by mouth every Saturday. Take with a full glass of water on an empty stomach.     . Artificial Tear Ointment (ARTIFICIAL TEARS) ointment Place 1 drop into both eyes as needed (dry eyes).     Marland Kitchen aspirin 81 MG EC tablet Take 81 mg by mouth daily with breakfast. Swallow whole.    Marland Kitchen atorvastatin (LIPITOR) 80 MG tablet TAKE 1 TABLET BY MOUTH EVERY DAY AT 6:00 PM 90 tablet 3  . Azelaic Acid (FINACEA) 15 % cream Apply 1 application topically at bedtime. After skin is thoroughly washed and patted dry, gently but thoroughly massage a thin film of azelaic acid cream into the affected area twice daily, in the morning and evening.     . calcium carbonate (TUMS EX) 750 MG chewable tablet Chew 1 tablet by mouth daily.    . cholecalciferol (VITAMIN D) 1000 units tablet Take 1,000 Units by mouth daily.    . clopidogrel (PLAVIX) 75 MG tablet Take 1 tablet (75 mg total) by  mouth daily. 90 tablet 3  . isosorbide mononitrate (IMDUR) 30 MG 24 hr tablet Take 1 tablet (30 mg total) by mouth daily. 90 tablet 3  . metoprolol succinate (TOPROL-XL) 25 MG 24 hr tablet Take 25 mg by mouth daily with breakfast.    . pantoprazole (PROTONIX) 40 MG tablet Take 1 tablet (40 mg total) by mouth daily. 30 tablet 6  . tobramycin-dexamethasone (TOBRADEX) ophthalmic ointment Place 1 application into both eyes See admin instructions. Tuesday AND Saturday patient does not use. Will apply at night on all other nights.    . nitroGLYCERIN (NITROSTAT) 0.4 MG SL tablet Place 1 tablet (0.4 mg total) under the tongue  every 5 (five) minutes as needed for chest pain. 25 tablet 3   No facility-administered medications prior to visit.      Allergies:   Sulfa antibiotics; Novocain [procaine]; and Penicillins   Social History   Socioeconomic History  . Marital status: Single    Spouse name: None  . Number of children: None  . Years of education: None  . Highest education level: None  Social Needs  . Financial resource strain: None  . Food insecurity - worry: None  . Food insecurity - inability: None  . Transportation needs - medical: None  . Transportation needs - non-medical: None  Occupational History  . None  Tobacco Use  . Smoking status: Former Smoker    Years: 5.00    Types: Cigarettes    Last attempt to quit: 02/06/1969    Years since quitting: 48.4  . Smokeless tobacco: Never Used  Substance and Sexual Activity  . Alcohol use: No  . Drug use: No  . Sexual activity: None    Comment: MARRIED  Other Topics Concern  . None  Social History Narrative  . None     Family History:  The patient's family history includes Alzheimer's disease in her father; Heart attack in her mother; Hypertension in her father; Other (age of onset: 14) in her sister; Stroke in her father.   ROS:   Please see the history of present illness.    Headaches on occasion otherwise unremarkable. All other systems reviewed and are negative.   PHYSICAL EXAM:   VS:  BP 128/70   Pulse 62   Ht 5\' 3"  (1.6 m)   Wt 140 lb (63.5 kg)   BMI 24.80 kg/m    GEN: Well nourished, well developed, in no acute distress  HEENT: normal  Neck: no JVD, carotid bruits, or masses Cardiac: RRR; no murmurs, rubs, or gallops,no edema  Respiratory:  clear to auscultation bilaterally, normal work of breathing GI: soft, nontender, nondistended, + BS MS: no deformity or atrophy  Skin: warm and dry, no rash Neuro:  Alert and Oriented x 3, Strength and sensation are intact Psych: euthymic mood, full affect  Wt Readings from Last 3  Encounters:  07/17/17 140 lb (63.5 kg)  05/07/17 139 lb 1.9 oz (63.1 kg)  04/11/17 139 lb 5.3 oz (63.2 kg)      Studies/Labs Reviewed:   EKG:  EKG  No acute changes noted on the EKG performed last evening.  Normal sinus rhythm with RSR prime V1 and V2.  No change compared to prior tracings.  Recent Labs: 05/23/2017: ALT 34 07/17/2017: BUN 14; Creatinine, Ser 0.87; Hemoglobin 12.3; Platelets 187; Potassium 3.7; Sodium 138   Lipid Panel    Component Value Date/Time   CHOL 117 05/06/2017 0806   TRIG 74 05/06/2017 0806   HDL 57  05/06/2017 0806   CHOLHDL 2.1 05/06/2017 0806   LDLCALC 45 05/06/2017 0806    Additional studies/ records that were reviewed today include:  Cardiac catheterization with PCI 03/14/2017: Coronary Diagrams   Diagnostic Diagram       Post-Intervention Diagram        48-hour Holter monitor: At the time of discomfort last evening.  During the time that she felt fluttering in her chest, there was one PAC.  No atrial fibrillation was noted.   ASSESSMENT:    1. Angina pectoris ([place])   2. Paroxysmal atrial fibrillation ([place])   3. H/O class III angina pectoris   4. Hyperlipidemia LDL goal <70      PLAN:  In order of problems listed above:  1. Possibly but no evidence of ischemia on EKG or injury on lab work.  She does have residual disease other than the [name] stent that was implanted.  Residual disease seemed better treated with medical therapy.  Should symptoms persist we will need to repeat coronary angiography.  For the time being I have asked her to use nitroglycerin as liberally as needed but only for chest tightness or pressure.  It should not be used for fleeting episodes of chest discomfort or for palpitations.  It is difficult for me to tell if she understands but I also discussed this with the husband. 2. No documentation to this point of recurrent atrial fibrillation. 3. LDL target at 70.    Medication Adjustments/Labs and Tests  Ordered: Current medicines are reviewed at length with the patient today.  Concerns regarding medicines are outlined above.  Medication changes, Labs and Tests ordered today are listed in the Patient Instructions below. Patient Instructions  Medication Instructions:  1) Please record the number of Nitro tablets you take between now and the next time you see [place].  Please only take one when you are having chest pain or pressure that lasts more than 3-5 minutes.  Labwork: None  Testing/Procedures: None  Follow-Up: Your physician recommends that you schedule a follow-up appointment in: 1 month with Dr. Tamala Julian.    Any Other Special Instructions Will Be Listed Below (If Applicable).     If you need a refill on your cardiac medications before your next appointment, please call your pharmacy.      Signed, Sinclair Grooms, MD  07/17/2017 4:11 PM    [facility] [address], [place], [place]  [address] Phone: [phone]; Fax: [phone]

## 2017-07-19 ENCOUNTER — Encounter (HOSPITAL_COMMUNITY)
Admission: RE | Admit: 2017-07-19 | Discharge: 2017-07-19 | Disposition: A | Discharge status: Home / Self Care | Payer: Medicare Other | Source: Ambulatory Visit | Attending: Interventional Cardiology | Admitting: Interventional Cardiology

## 2017-07-19 DIAGNOSIS — Z951 Presence of aortocoronary bypass graft: Secondary | ICD-10-CM | POA: Diagnosis not present

## 2017-07-19 DIAGNOSIS — M8580 Other specified disorders of bone density and structure, unspecified site: Secondary | ICD-10-CM | POA: Diagnosis not present

## 2017-07-19 DIAGNOSIS — I4891 Unspecified atrial fibrillation: Secondary | ICD-10-CM | POA: Diagnosis not present

## 2017-07-19 DIAGNOSIS — Z955 Presence of coronary angioplasty implant and graft: Secondary | ICD-10-CM

## 2017-07-19 DIAGNOSIS — Z79899 Other long term (current) drug therapy: Secondary | ICD-10-CM | POA: Diagnosis not present

## 2017-07-19 DIAGNOSIS — N183 Chronic kidney disease, stage 3 (moderate): Secondary | ICD-10-CM | POA: Diagnosis not present

## 2017-07-19 DIAGNOSIS — I2510 Atherosclerotic heart disease of native coronary artery without angina pectoris: Secondary | ICD-10-CM | POA: Diagnosis not present

## 2017-07-22 ENCOUNTER — Encounter (HOSPITAL_COMMUNITY)
Admission: RE | Admit: 2017-07-22 | Discharge: 2017-07-22 | Disposition: A | Discharge status: Home / Self Care | Payer: Medicare Other | Source: Ambulatory Visit | Attending: Interventional Cardiology | Admitting: Interventional Cardiology

## 2017-07-22 VITALS — Ht 62.25 in | Wt 138.4 lb

## 2017-07-22 DIAGNOSIS — Z951 Presence of aortocoronary bypass graft: Secondary | ICD-10-CM | POA: Diagnosis not present

## 2017-07-22 DIAGNOSIS — I4891 Unspecified atrial fibrillation: Secondary | ICD-10-CM | POA: Diagnosis not present

## 2017-07-22 DIAGNOSIS — Z955 Presence of coronary angioplasty implant and graft: Secondary | ICD-10-CM

## 2017-07-22 DIAGNOSIS — N183 Chronic kidney disease, stage 3 (moderate): Secondary | ICD-10-CM | POA: Diagnosis not present

## 2017-07-22 DIAGNOSIS — Z79899 Other long term (current) drug therapy: Secondary | ICD-10-CM | POA: Diagnosis not present

## 2017-07-22 DIAGNOSIS — M8580 Other specified disorders of bone density and structure, unspecified site: Secondary | ICD-10-CM | POA: Diagnosis not present

## 2017-07-22 DIAGNOSIS — I2510 Atherosclerotic heart disease of native coronary artery without angina pectoris: Secondary | ICD-10-CM | POA: Diagnosis not present

## 2017-07-23 ENCOUNTER — Telehealth: Payer: Self-pay | Admitting: *Deleted

## 2017-07-23 ENCOUNTER — Telehealth (HOSPITAL_COMMUNITY): Payer: Self-pay | Admitting: Cardiac Rehabilitation

## 2017-07-23 MED ORDER — METOPROLOL SUCCINATE ER 50 MG PO TB24: 50.0000 mg | ORAL_TABLET | Freq: Every day | ORAL | 3 refills | Status: DC

## 2017-07-23 NOTE — Telephone Encounter (Signed)
Spoke with pt and went over recommendations per Dr. Smith.  Pt verbalized understanding and was in agreement with this plan.   

## 2017-07-23 NOTE — Telephone Encounter (Signed)
pc to discuss holding cardiac rehab until next week. Pt increasing metoprolol dose tomorrow am.  Pt verbalized understanding.  Andi Hence, RN, BSN Cardiac Pulmonary Rehab

## 2017-07-23 NOTE — Telephone Encounter (Signed)
-----   Message from Belva Crome, MD sent at 07/23/2017  2:35 PM EST ----- Regarding: RE: cardiac rehab  We will increase Toprol-XL to 50 mg daily.  If she continues having chest discomfort with minimal activity, she would need repeat coronary angiography.  If she experiences mild to moderate angina she should rest and not push through the chest discomfort.  Anderson Malta, please have the patient increase metoprolol XL to 50 mg/day. ----- Message ----- From: Lowell Guitar, RN Sent: 07/22/2017   5:26 PM To: Belva Crome, MD Subject: cardiac rehab                                  Dear Dr. Tamala Julian,  [place] had 4/10 chest and neck discomfort while doing 6 minute walk test at cardiac rehab. Walking has been her hardest activity at rehab. She was able to walk 1095ft (78minutes)  before stopping.   Symptoms lasted around 1 minute with exertion.  Symptoms relieved within 30 seconds with rest.  VSS, no EKG abnormalities on rhythm strip.  BP: 130/62, HR- 98 sinus rhythm.  Pt able to resume seated exercise within 10 minutes and tolerated without difficulty.    What is a recommended exercise endpoint for her when she experiences chest discomfort?  She has questioned if she should try to continue working through mild discomfort.  She is also interested in cardiac maintenance program.  She will finish the monitored program in 2 weeks.  We will forward order to you when she decides.  Thank you Andi Hence, RN, BSN Cardiac Pulmonary Rehab

## 2017-07-23 NOTE — Telephone Encounter (Signed)
-----   Message from Belva Crome, MD sent at 07/23/2017  2:35 PM EST ----- Regarding: RE: cardiac rehab  We will increase Toprol-XL to 50 mg daily.  If she continues having chest discomfort with minimal activity, she would need repeat coronary angiography.  If she experiences mild to moderate angina she should rest and not push through the chest discomfort.  Anderson Malta, please have the patient increase metoprolol XL to 50 mg/day. ----- Message ----- From: Lowell Guitar, RN Sent: 07/22/2017   5:26 PM To: Belva Crome, MD Subject: cardiac rehab                                  Dear Dr. Tamala Julian,  [place] had 4/10 chest and neck discomfort while doing 6 minute walk test at cardiac rehab. Walking has been her hardest activity at rehab. She was able to walk 1063ft (72minutes)  before stopping.   Symptoms lasted around 1 minute with exertion.  Symptoms relieved within 30 seconds with rest.  VSS, no EKG abnormalities on rhythm strip.  BP: 130/62, HR- 98 sinus rhythm.  Pt able to resume seated exercise within 10 minutes and tolerated without difficulty.    What is a recommended exercise endpoint for her when she experiences chest discomfort?  She has questioned if she should try to continue working through mild discomfort.  She is also interested in cardiac maintenance program.  She will finish the monitored program in 2 weeks.  We will forward order to you when she decides.  Thank you Andi Hence, RN, BSN Cardiac Pulmonary Rehab

## 2017-07-24 ENCOUNTER — Encounter (HOSPITAL_COMMUNITY)
Admission: RE | Admit: 2017-07-24 | Discharge: 2017-07-24 | Disposition: A | Discharge status: Home / Self Care | Payer: Medicare Other | Source: Ambulatory Visit | Attending: Interventional Cardiology | Admitting: Interventional Cardiology

## 2017-07-26 ENCOUNTER — Encounter (HOSPITAL_COMMUNITY): Payer: Medicare Other

## 2017-07-29 ENCOUNTER — Encounter (HOSPITAL_COMMUNITY)
Admission: RE | Admit: 2017-07-29 | Discharge: 2017-07-29 | Disposition: A | Discharge status: Home / Self Care | Payer: Medicare Other | Source: Ambulatory Visit | Attending: Interventional Cardiology | Admitting: Interventional Cardiology

## 2017-07-29 DIAGNOSIS — N183 Chronic kidney disease, stage 3 (moderate): Secondary | ICD-10-CM | POA: Diagnosis not present

## 2017-07-29 DIAGNOSIS — Z951 Presence of aortocoronary bypass graft: Secondary | ICD-10-CM | POA: Diagnosis not present

## 2017-07-29 DIAGNOSIS — Z955 Presence of coronary angioplasty implant and graft: Secondary | ICD-10-CM

## 2017-07-29 DIAGNOSIS — I4891 Unspecified atrial fibrillation: Secondary | ICD-10-CM | POA: Diagnosis not present

## 2017-07-29 DIAGNOSIS — I2510 Atherosclerotic heart disease of native coronary artery without angina pectoris: Secondary | ICD-10-CM | POA: Diagnosis not present

## 2017-07-29 DIAGNOSIS — Z79899 Other long term (current) drug therapy: Secondary | ICD-10-CM | POA: Diagnosis not present

## 2017-07-29 DIAGNOSIS — M8580 Other specified disorders of bone density and structure, unspecified site: Secondary | ICD-10-CM | POA: Diagnosis not present

## 2017-07-31 ENCOUNTER — Ambulatory Visit (HOSPITAL_COMMUNITY): Payer: Self-pay | Admitting: Cardiac Rehabilitation

## 2017-07-31 ENCOUNTER — Telehealth: Payer: Self-pay | Admitting: Interventional Cardiology

## 2017-07-31 ENCOUNTER — Encounter (HOSPITAL_COMMUNITY)
Admission: RE | Admit: 2017-07-31 | Discharge: 2017-07-31 | Disposition: A | Discharge status: Home / Self Care | Payer: Medicare Other | Source: Ambulatory Visit | Attending: Interventional Cardiology | Admitting: Interventional Cardiology

## 2017-07-31 DIAGNOSIS — Z955 Presence of coronary angioplasty implant and graft: Secondary | ICD-10-CM

## 2017-07-31 NOTE — Telephone Encounter (Signed)
New message    Patient is at cardiac rehab , started having chest pain  Pt c/o of Chest Pain: STAT if CP now or developed within 24 hours  1. Are you having CP right now?  yes  2. Are you experiencing any other symptoms (ex. SOB, nausea, vomiting, sweating)? no  3. How long have you been experiencing CP?  With exercise about 10 minutes ago  4. Is your CP continuous or coming and going? Coming and going , subsided with rest   5. Have you taken Nitroglycerin? no ?

## 2017-07-31 NOTE — Telephone Encounter (Addendum)
Spoke with Sherry Barnett at cardiac rehab. Pt experienced chest pressure today after exercising 20 min-doing lighter than normal activity (on stepper).  Pressure is center of chest. No other associated symtoms.  She did increase metoprolol to 50mg  as recommended on 2/12 by Dr. Tamala Julian.  BP/HR today: 138/82, 80s during the chest pressure.  Also had one episode getting hair done, took 1 ntg.   Reviewed with Dr. Tamala Julian.  He recommends heart catheterization for ongoing episodes of chest discomfort. Called Sherry Barnett back at cardiac rehab and advised that Dr. Thompson Caul nurse will contact patient later today.

## 2017-07-31 NOTE — Telephone Encounter (Signed)
Spoke with pt and scheduled her to come in and see Dr. Tamala Julian on 2/22 to discuss possible cath set up.  Pt verbalized understanding and was in agreement with this plan.

## 2017-07-31 NOTE — Progress Notes (Signed)
Incomplete Session Note  Patient Details  Name: EVVIE BEHRMANN MRN: [id number] Date of Birth: 1932/12/13 Referring Provider:     CARDIAC REHAB PHASE II ORIENTATION from 04/11/2017 in [facility]  Referring Provider  Daneen Schick MD      Elida Aram Candela did not complete her rehab session.  Pt c/o chest pressure.  Rates 3/10.  Resolved with rest after 5 minutes. No associated symptoms .  Pt  Rhythm strip sinus rhythm.  BP:  138/82.  Pt reports she also had similar episode at rest last Saturday while getting her hair cut.  PC to Dr. Thompson Caul office.  Spoke to triage RN, Bettey Costa.  Per Lurena Joiner at Dr. Darliss Ridgel direction, pt will be contacted via telephone by Dr. Darliss Ridgel nurse Anderson Malta to discuss cardiac cath.  Pt and husband given this information when to take NTG and when to call 911 reinforced.  Pt advised to avoid physical exertion until further evaluation.  Pt and husband both verbalized understanding. Pt verifies she is symptom free prior to leaving department.    Andi Hence, RN, BSN Cardiac Pulmonary Rehab 07/31/17  12:15 PM

## 2017-08-01 ENCOUNTER — Encounter (HOSPITAL_COMMUNITY): Payer: Self-pay

## 2017-08-01 NOTE — Progress Notes (Deleted)
Cardiac Individual Treatment Plan  Patient Details  Name: Sherry Barnett MRN: [id number] Date of Birth: 26-Jul-1932 Referring Provider:     CARDIAC REHAB PHASE II ORIENTATION from 04/11/2017 in [place]  Referring Provider  Daneen Schick MD      Initial Encounter Date:    CARDIAC REHAB PHASE II ORIENTATION from 04/11/2017 in [place]  Date  04/11/17  Referring Provider  Daneen Schick MD      Visit Diagnosis: S/P drug eluting coronary stent placement  Patient's Home Medications on Admission:  Current Outpatient Medications:  .  alendronate (FOSAMAX) 70 MG tablet, Take 70 mg by mouth every Saturday. Take with a full glass of water on an empty stomach. , Disp: , Rfl:  .  Artificial Tear Ointment (ARTIFICIAL TEARS) ointment, Place 1 drop into both eyes as needed (dry eyes). , Disp: , Rfl:  .  aspirin 81 MG EC tablet, Take 81 mg by mouth daily with breakfast. Swallow whole., Disp: , Rfl:  .  atorvastatin (LIPITOR) 80 MG tablet, TAKE 1 TABLET BY MOUTH EVERY DAY AT 6:00 PM, Disp: 90 tablet, Rfl: 3 .  Azelaic Acid (FINACEA) 15 % cream, Apply 1 application topically at bedtime. After skin is thoroughly washed and patted dry, gently but thoroughly massage a thin film of azelaic acid cream into the affected area twice daily, in the morning and evening. , Disp: , Rfl:  .  calcium carbonate (TUMS EX) 750 MG chewable tablet, Chew 1 tablet by mouth daily., Disp: , Rfl:  .  cholecalciferol (VITAMIN D) 1000 units tablet, Take 1,000 Units by mouth daily., Disp: , Rfl:  .  clopidogrel (PLAVIX) 75 MG tablet, Take 1 tablet (75 mg total) by mouth daily., Disp: 90 tablet, Rfl: 3 .  isosorbide mononitrate (IMDUR) 30 MG 24 hr tablet, Take 1 tablet (30 mg total) by mouth daily., Disp: 90 tablet, Rfl: 3 .  metoprolol succinate (TOPROL-XL) 50 MG 24 hr tablet, Take 1 tablet (50 mg total) by mouth daily. Take with or immediately following a meal., Disp:  90 tablet, Rfl: 3 .  nitroGLYCERIN (NITROSTAT) 0.4 MG SL tablet, Place 1 tablet (0.4 mg total) under the tongue every 5 (five) minutes as needed for chest pain., Disp: 25 tablet, Rfl: 3 .  pantoprazole (PROTONIX) 40 MG tablet, Take 1 tablet (40 mg total) by mouth daily., Disp: 30 tablet, Rfl: 6 .  tobramycin-dexamethasone (TOBRADEX) ophthalmic ointment, Place 1 application into both eyes See admin instructions. Tuesday AND Saturday patient does not use. Will apply at night on all other nights., Disp: , Rfl:   Past Medical History: Past Medical History:  Diagnosis Date  . A-fib ([place])   . Bilateral cataracts   . Blepharitis    chronic  . CAD (coronary artery disease), native coronary artery    10/18 PCI/DES to p/mLAD, normal EF  . Cardiovascular risk factor    23%  . Degenerative joint disease (DJD) of lumbar spine   . Diverticulosis   . Endometriosis   . Fibroids   . Hematuria, microscopic [id number]  . Hemorrhoids   . Hypercholesterolemia    10 years  . Osteopenia 03/2016   start alendrodate  . Renal disease    stage2/stage 3    Tobacco Use: Social History   Tobacco Use  Smoking Status Former Smoker  . Years: 5.00  . Types: Cigarettes  . Last attempt to quit: 02/06/1969  . Years since quitting: 48.5  Smokeless  Tobacco Never Used    Labs: Recent Review Scientist, physiological    Labs for ITP Cardiac and Pulmonary Rehab Latest Ref Rng & Units 05/06/2017   Cholestrol 100 - 199 mg/dL 117   LDLCALC 0 - 99 mg/dL 45   HDL >39 mg/dL 57   Trlycerides 0 - 149 mg/dL 74      Capillary Blood Glucose: No results found for: GLUCAP   Exercise Target Goals:    Exercise Program Goal: Individual exercise prescription set using results from initial 6 min walk test and THRR while considering  patient's activity barriers and safety.   Exercise Prescription Goal: Initial exercise prescription builds to 30-45 minutes a day of aerobic activity, 2-3 days per week.  Home exercise guidelines will  be given to patient during program as part of exercise prescription that the participant will acknowledge.  Activity Barriers & Risk Stratification: Activity Barriers & Cardiac Risk Stratification - 04/11/17 1157      Activity Barriers & Cardiac Risk Stratification   Cardiac Risk Stratification  Moderate       6 Minute Walk: 6 Minute Walk    Row Name 04/11/17 1141 04/11/17 1208 07/23/17 1234     6 Minute Walk   Phase  Initial  -  Initial   Distance  733 feet  -  -   Walk Time  4.54 minutes  -  -   # of Rest Breaks  0  -  -   MPH  1.8  -  -   METS  1.1  -  -   VO2 Peak  3.87  -  -   Symptoms  Yes (comment)  -  -   Comments  chest pressure radiating to bilateral jaws; SOB  chest pressure radiating to bilateral jaws; SOB. Test terminated at 4:54. Pt reported 4-5/10 for chest pain/discomfort  -   Resting HR  69 bpm  -  -   Resting BP  112/80  -  -   Resting Oxygen Saturation   97 %  -  -   Exercise Oxygen Saturation  during 6 min walk  99 %  -  -   Max Ex. HR  90 bpm  -  -   Max Ex. BP  128/84  -  -   2 Minute Post BP  130/51  -  -   Row Name 07/24/17 1124 07/24/17 1136       6 Minute Walk   Phase  Discharge  -    Distance  1000 feet  -    Distance % Change  36.43 %  -    Distance Feet Change  267 ft  -    Walk Time  5 minutes  -    # of Rest Breaks  0  -    MPH  2.3  -    METS  1.7  -    RPE  13  -    VO2 Peak  5.96  -    Symptoms  Yes (comment)  -    Comments  4/10 chest presssure, walk test terminated at 5 minutes  -    Resting HR  92 bpm  -    Resting BP  110/60  -    Max Ex. HR  96 bpm  -    Max Ex. BP  130/62  -    2 Minute Post BP  -  108/60       Oxygen Initial Assessment:  Oxygen Re-Evaluation:   Oxygen Discharge (Final Oxygen Re-Evaluation):   Initial Exercise Prescription: Initial Exercise Prescription - 04/11/17 1100      Date of Initial Exercise RX and Referring Provider   Date  04/11/17    Referring Provider  Daneen Schick MD       Recumbant Bike   Level  1.5    Watts  10    Minutes  5    METs  2.24      NuStep   Level  2    SPM  75    Minutes  10    METs  2      Track   Laps  6    Minutes  10    METs  2.03      Prescription Details   Frequency (times per week)  3    Duration  Progress to 30 minutes of continuous aerobic without signs/symptoms of physical distress      Intensity   THRR 40-80% of Max Heartrate  55-110    Ratings of Perceived Exertion  11-13    Perceived Dyspnea  0-4      Progression   Progression  Continue to progress workloads to maintain intensity without signs/symptoms of physical distress.      Resistance Training   Training Prescription  Yes    Weight  2lbs    Reps  10-15       Perform Capillary Blood Glucose checks as needed.  Exercise Prescription Changes: Exercise Prescription Changes    Row Name 04/22/17 1344 05/06/17 1600 05/17/17 1629 05/27/17 1600 06/17/17 1600     Response to Exercise   Blood Pressure (Admit)  130/70  120/80  108/62  122/60  102/70   Blood Pressure (Exercise)  122/80  120/60  116/70  120/60  114/60   Blood Pressure (Exit)  134/62  102/62  112/60  112/64  102/60   Heart Rate (Admit)  70 bpm  75 bpm  79 bpm  79 bpm  78 bpm   Heart Rate (Exercise)  85 bpm  83 bpm  87 bpm  96 bpm  86 bpm   Heart Rate (Exit)  69 bpm  63 bpm  67 bpm  64 bpm  78 bpm   Symptoms  none  none  none  none  none   Comments  pt was oriented to exercise equipment. Pt did well with first exercise session.  -  -  -  -   Duration  Continue with 30 min of aerobic exercise without signs/symptoms of physical distress.  Continue with 30 min of aerobic exercise without signs/symptoms of physical distress.  Continue with 30 min of aerobic exercise without signs/symptoms of physical distress.  Continue with 30 min of aerobic exercise without signs/symptoms of physical distress.  Continue with 30 min of aerobic exercise without signs/symptoms of physical distress.   Intensity  THRR  unchanged  THRR unchanged  THRR unchanged  THRR unchanged  THRR unchanged     Progression   Progression  Continue to progress workloads to maintain intensity without signs/symptoms of physical distress.  Continue to progress workloads to maintain intensity without signs/symptoms of physical distress.  Continue to progress workloads to maintain intensity without signs/symptoms of physical distress.  Continue to progress workloads to maintain intensity without signs/symptoms of physical distress.  Continue to progress workloads to maintain intensity without signs/symptoms of physical distress.   Average METs  1.5  1.9  1.7  1.7  1.7  Resistance Training   Training Prescription  Yes  Yes  Yes  Yes  Yes   Weight  2lbs  2lbs  2lbs  2lbs  2lbs   Reps  10-15  10-15  10-15  10-15  10-15   Time  10 Minutes  10 Minutes  10 Minutes  10 Minutes  10 Minutes     Recumbant Bike   Level  1.5  1.5  2  2  2    Watts  10  10  10  10  12    Minutes  5  5  5  5  5    METs  1.5  1.5  1.5  1.5  2     NuStep   Level  2  3  3  3  3    SPM  50  70  70  70  70   Minutes  10  10  10  10  10    METs  1.3  1.6  1.6  1.6  1.6     Track   Laps  5  9  4  6  5    Minutes  10  10  10  10  10    METs  1.87  2.57  1.7  2.03  1.8     Home Exercise Plan   Plans to continue exercise at  -  Home (comment) seated chair exercises and cardio equipment at senior center  Home (comment) seated chair exercises and cardio equipment at senior center  Home (comment) seated chair exercises and cardio equipment at [place] [facility] (comment) seated chair exercises and cardio equipment at senior center   Frequency  -  Add 2 additional days to program exercise sessions.  Add 2 additional days to program exercise sessions.  Add 2 additional days to program exercise sessions.  Add 2 additional days to program exercise sessions.   Initial Home Exercises Provided  -  04/29/17  04/29/17  04/29/17  04/29/17   Row Name 07/03/17 1100 07/15/17 1521  07/29/17 0821         Response to Exercise   Blood Pressure (Admit)  118/60  112/62  112/60     Blood Pressure (Exercise)  110/56  116/62  118/54     Blood Pressure (Exit)  104/60  104/68  116/74     Heart Rate (Admit)  75 bpm  77 bpm  88 bpm     Heart Rate (Exercise)  90 bpm  87 bpm  86 bpm     Heart Rate (Exit)  62 bpm  64 bpm  66 bpm     Symptoms  none  none  none     Duration  Continue with 30 min of aerobic exercise without signs/symptoms of physical distress.  Continue with 30 min of aerobic exercise without signs/symptoms of physical distress.  Continue with 30 min of aerobic exercise without signs/symptoms of physical distress.     Intensity  THRR unchanged  THRR unchanged  THRR unchanged       Progression   Progression  Continue to progress workloads to maintain intensity without signs/symptoms of physical distress.  Continue to progress workloads to maintain intensity without signs/symptoms of physical distress.  Continue to progress workloads to maintain intensity without signs/symptoms of physical distress.     Average METs  1.9  1.7  1.6       Resistance Training   Training Prescription  No relaxation day  Yes relaxation day  Yes  Weight  -  2lbs  2lbs     Reps  -  10-15  10-15     Time  -  10 Minutes  10 Minutes       Recumbant Bike   Level  3  3  3      Watts  12  8  8      Minutes  15  15  10      METs  1.9  1.9  1.7       NuStep   Level  3  3  3      SPM  70  70  60     Minutes  15  15  15      METs  1.5  1.4  1.3       Track   Laps  5  -  5     Minutes  6  -  10     METs  2.45  -  1.8       Home Exercise Plan   Plans to continue exercise at  Home (comment)  Home (comment)  Home (comment)     Frequency  Add 2 additional days to program exercise sessions.  Add 2 additional days to program exercise sessions.  Add 2 additional days to program exercise sessions.     Initial Home Exercises Provided  04/29/17  04/29/17  04/29/17        Exercise  Comments: Exercise Comments    Row Name 04/22/17 1345 05/06/17 1643 05/27/17 1103 06/24/17 1217 08/01/17 0823   Exercise Comments  Pt oriented to exercise equipment today. Pt did well with exercise prescription. Will continue to monitor pt's progress and activity levels.   Reviewed METS and goals. Pt is responding well to exercise program/prescription. Recently reviewed HEP, and will continue to monitor pt's fitness and activity levels.   Reviewed METS and goals. Pt is responding well to exercise program/prescription. Recently reviewed HEP, and will continue to monitor pt's fitness and activity levels.   Reviewed METS and goals. Pt is responding well to exercise program/prescription. Recently reviewed HEP, and will continue to monitor pt's fitness and activity levels.   Reviewed METS and goals. Pt is responding well to exercise program/prescription. Recently reviewed HEP, and will continue to monitor pt's fitness and activity levels.       Exercise Goals and Review: Exercise Goals    Row Name 04/11/17 1014             Exercise Goals   Increase Physical Activity  Yes       Intervention  Provide advice, education, support and counseling about physical activity/exercise needs.;Develop an individualized exercise prescription for aerobic and resistive training based on initial evaluation findings, risk stratification, comorbidities and participant's personal goals.       Expected Outcomes  Achievement of increased cardiorespiratory fitness and enhanced flexibility, muscular endurance and strength shown through measurements of functional capacity and personal statement of participant.       Increase Strength and Stamina  Yes Be able to exercise and walk long distances without SOB/CP       Intervention  Develop an individualized exercise prescription for aerobic and resistive training based on initial evaluation findings, risk stratification, comorbidities and participant's personal goals.;Provide  advice, education, support and counseling about physical activity/exercise needs.       Expected Outcomes  Achievement of increased cardiorespiratory fitness and enhanced flexibility, muscular endurance and strength shown through measurements of functional capacity and personal statement of  participant.       Able to understand and use rate of perceived exertion (RPE) scale  Yes       Intervention  Provide education and explanation on how to use RPE scale       Expected Outcomes  Short Term: Able to use RPE daily in rehab to express subjective intensity level;Long Term:  Able to use RPE to guide intensity level when exercising independently       Knowledge and understanding of Target Heart Rate Range (THRR)  Yes       Intervention  Provide education and explanation of THRR including how the numbers were predicted and where they are located for reference       Expected Outcomes  Short Term: Able to state/look up THRR;Long Term: Able to use THRR to govern intensity when exercising independently;Short Term: Able to use daily as guideline for intensity in rehab       Able to check pulse independently  Yes       Intervention  Provide education and demonstration on how to check pulse in carotid and radial arteries.;Review the importance of being able to check your own pulse for safety during independent exercise       Expected Outcomes  Short Term: Able to explain why pulse checking is important during independent exercise;Long Term: Able to check pulse independently and accurately       Understanding of Exercise Prescription  Yes       Intervention  Provide education, explanation, and written materials on patient's individual exercise prescription       Expected Outcomes  Short Term: Able to explain program exercise prescription;Long Term: Able to explain home exercise prescription to exercise independently          Exercise Goals Re-Evaluation : Exercise Goals Re-Evaluation    Row Name 04/29/17 0946  05/06/17 1644 05/27/17 1100 06/24/17 1217 07/03/17 1658     Exercise Goal Re-Evaluation   Exercise Goals Review  Increase Physical Activity;Able to understand and use rate of perceived exertion (RPE) scale;Knowledge and understanding of Target Heart Rate Range (THRR);Understanding of Exercise Prescription;Increase Strength and Stamina;Able to check pulse independently  Increase Physical Activity;Able to understand and use rate of perceived exertion (RPE) scale;Knowledge and understanding of Target Heart Rate Range (THRR);Understanding of Exercise Prescription;Increase Strength and Stamina;Able to check pulse independently  Increase Physical Activity;Able to understand and use rate of perceived exertion (RPE) scale;Knowledge and understanding of Target Heart Rate Range (THRR);Understanding of Exercise Prescription;Increase Strength and Stamina;Able to check pulse independently  Increase Physical Activity;Able to understand and use rate of perceived exertion (RPE) scale;Knowledge and understanding of Target Heart Rate Range (THRR);Understanding of Exercise Prescription;Increase Strength and Stamina;Able to check pulse independently  Increase Physical Activity;Able to understand and use rate of perceived exertion (RPE) scale;Knowledge and understanding of Target Heart Rate Range (THRR);Understanding of Exercise Prescription;Increase Strength and Stamina;Able to check pulse independently   Comments  Reviewed home exercise with pt today.  Pt plans to do seated chair exercise to increase strength and core. Pt will do exercises 2x/week in addition to coming to cardiac rehab  Reviewed THR, pulse, RPE, sign and symptoms, NTG use, and when to call 911 or MD.  Also discussed weather considerations and indoor options.  Pt voiced understanding.  Pt has increase exercise tolerance and walking capacity. Pt initially started with 5 laps of walking and is now up to 9 laps. Pt has c/o of anginal like symptoms with walking uphill  and incline. Informed  cardiology in which she will meet for further evaluation on 05/07/17. Will f/u.  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 Pt modified performance in which she " lip sync" for the remainder of the concert. Pt stated that she is able to enjoy activities but takes breaks every now and then.   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 Pt made mention of fatigue and SOB. Discussed activity limitations, and taking breaks. Pt voiced understanding.  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 Pt made mention of fatigue and SOB. Discussed activity limitations, and taking breaks. Pt voiced understanding.   Expected Outcomes  Pt will be compliant with HEP and improve in cardiorespiratory fitness  Pt will be compliant with HEP and improve in cardiorespiratory fitness  Pt will continue to improve in cardiorespiratory fitness and continue to get back into activities while understanding limitations  Pt will continue to improve in cardiorespiratory fitness and continue to get back into activities while understanding limitations  Pt will continue to improve in cardiorespiratory fitness and continue to get back into activities while understanding limitations   Row Name 08/01/17 0824             Exercise Goal Re-Evaluation   Exercise Goals Review  Increase Physical Activity;Able to understand and use rate of perceived exertion (RPE) scale;Knowledge and understanding of Target Heart Rate Range (THRR);Understanding of Exercise Prescription;Increase Strength and Stamina;Able to check pulse independently       Comments  Pt is maintaining WL's but is often time limited by anginal symptoms/chest discomfort. Pt did report chest discomfort on 07/31/17 in which RN and cardiology team were notified for further evaluation.       Expected Outcomes  Pt will continue to improve in cardiorespiratory fitness and continue to get back into  activities while understanding limitations           Discharge Exercise Prescription (Final Exercise Prescription Changes): Exercise Prescription Changes - 07/29/17 0821      Response to Exercise   Blood Pressure (Admit)  112/60    Blood Pressure (Exercise)  118/54    Blood Pressure (Exit)  116/74    Heart Rate (Admit)  88 bpm    Heart Rate (Exercise)  86 bpm    Heart Rate (Exit)  66 bpm    Symptoms  none    Duration  Continue with 30 min of aerobic exercise without signs/symptoms of physical distress.    Intensity  THRR unchanged      Progression   Progression  Continue to progress workloads to maintain intensity without signs/symptoms of physical distress.    Average METs  1.6      Resistance Training   Training Prescription  Yes    Weight  2lbs    Reps  10-15    Time  10 Minutes      Recumbant Bike   Level  3    Watts  8    Minutes  10    METs  1.7      NuStep   Level  3    SPM  60    Minutes  15    METs  1.3      Track   Laps  5    Minutes  10    METs  1.8      Home Exercise Plan   Plans to continue exercise at  Home (comment)  Frequency  Add 2 additional days to program exercise sessions.    Initial Home Exercises Provided  04/29/17       Nutrition:  Target Goals: Understanding of nutrition guidelines, daily intake of sodium 1500mg , cholesterol 200mg , calories 30% from fat and 7% or less from saturated fats, daily to have 5 or more servings of fruits and vegetables.  Biometrics:  Post Biometrics - 07/24/17 1133       Post  Biometrics   Height  5' 2.25" (1.581 m)    Weight  138 lb 7.2 oz (62.8 kg)    Waist Circumference  30 inches    Hip Circumference  40 inches    Waist to Hip Ratio  0.75 %    BMI (Calculated)  25.12    Triceps Skinfold  30 mm    % Body Fat  37.5 %    Grip Strength  20 kg    Flexibility  0 in    Single Leg Stand  30 seconds       Nutrition Therapy Plan and Nutrition Goals: Nutrition Therapy & Goals - 04/11/17 1113       Nutrition Therapy   Diet  Therapeutic Lifestyle Changes      Personal Nutrition Goals   Nutrition Goal  Pt to maintain her current wt while in Cardiac Rehab.       Intervention Plan   Intervention  Prescribe, educate and counsel regarding individualized specific dietary modifications aiming towards targeted core components such as weight, hypertension, lipid management, diabetes, heart failure and other comorbidities.    Expected Outcomes  Short Term Goal: Understand basic principles of dietary content, such as calories, fat, sodium, cholesterol and nutrients.;Long Term Goal: Adherence to prescribed nutrition plan.       Nutrition Assessments: Nutrition Assessments - 04/11/17 1113      MEDFICTS Scores   Pre Score  31       Nutrition Goals Re-Evaluation:   Nutrition Goals Re-Evaluation:   Nutrition Goals Discharge (Final Nutrition Goals Re-Evaluation):   Psychosocial: Target Goals: Acknowledge presence or absence of significant depression and/or stress, maximize coping skills, provide positive support system. Participant is able to verbalize types and ability to use techniques and skills needed for reducing stress and depression.  Initial Review & Psychosocial Screening: Initial Psych Review & Screening - 04/11/17 1245      Initial Review   Current issues with  None Identified      Family Dynamics   Good Support System?  Yes    Comments  Pt husband, former participant in cardiac rehab is supportive of pt rehab       Barriers   Psychosocial barriers to participate in program  The patient should benefit from training in stress management and relaxation.      Screening Interventions   Interventions  Encouraged to exercise       Quality of Life Scores: Quality of Life - 04/24/17 1418      Quality of Life Scores   Health/Function Pre  27.1 %    Socioeconomic Pre  28.07 %    Psych/Spiritual Pre  29.14 %    Family Pre  27.6 %    GLOBAL Pre  27.79 % QOL reviewed  with pt.  overall scores very good. pt exhibits positive outlook with good coping skills. pt expresses gratitude for her many blessings.      Scores of 19 and below usually indicate a poorer quality of life in these areas.  A difference of  2-3 points is a clinically meaningful difference.  A difference of 2-3 points in the total score of the Quality of Life Index has been associated with significant improvement in overall quality of life, self-image, physical symptoms, and general health in studies assessing change in quality of life.  PHQ-9: Recent Review Flowsheet Data    Depression screen [facility] 2/9 04/22/2017   Decreased Interest 0   Down, Depressed, Hopeless 0   PHQ - 2 Score 0     Interpretation of Total Score  Total Score Depression Severity:  1-4 = Minimal depression, 5-9 = Mild depression, 10-14 = Moderate depression, 15-19 = Moderately severe depression, 20-27 = Severe depression   Psychosocial Evaluation and Intervention: Psychosocial Evaluation - 04/22/17 1108      Psychosocial Evaluation & Interventions   Interventions  Encouraged to exercise with the program and follow exercise prescription    Comments  no psychosocial needs identified, no interventions necessary     Expected Outcomes  pt will exhibit positive outlook with good coping skills.     Continue Psychosocial Services   No Follow up required       Psychosocial Re-Evaluation: Psychosocial Re-Evaluation    Joyce Name 05/07/17 1619 05/28/17 1519 06/28/17 1437 07/23/17 1237       Psychosocial Re-Evaluation   Current issues with  None Identified  None Identified  None Identified  Current Anxiety/Panic    Comments  no psychosocial needs identified, no interventions necessary   no psychosocial needs identified, no interventions necessary   no psychosocial needs identified, no interventions necessary   pt exhibits health related anxiety from her husband and her own acute health concerns. pt encouraged to practice stress  management techniques to decrease anxiety and fear.      Expected Outcomes  pt will exhibit positive outlook with good coping skills.   pt will exhibit positive outlook with good coping skills.   pt will exhibit positive outlook with good coping skills.   pt will exhibit positive outlook with good coping skills.     Interventions  Encouraged to attend Cardiac Rehabilitation for the exercise  Encouraged to attend Cardiac Rehabilitation for the exercise  Encouraged to attend Cardiac Rehabilitation for the exercise  Encouraged to attend Cardiac Rehabilitation for the exercise       Psychosocial Discharge (Final Psychosocial Re-Evaluation): Psychosocial Re-Evaluation - 07/23/17 1237      Psychosocial Re-Evaluation   Current issues with  Current Anxiety/Panic    Comments  pt exhibits health related anxiety from her husband and her own acute health concerns. pt encouraged to practice stress management techniques to decrease anxiety and fear.      Expected Outcomes  pt will exhibit positive outlook with good coping skills.     Interventions  Encouraged to attend Cardiac Rehabilitation for the exercise       Vocational Rehabilitation: Provide vocational rehab assistance to qualifying candidates.   Vocational Rehab Evaluation & Intervention: Vocational Rehab - 04/11/17 1246      Initial Vocational Rehab Evaluation & Intervention   Assessment shows need for Vocational Rehabilitation  No       Education: Education Goals: Education classes will be provided on a weekly basis, covering required topics. Participant will state understanding/return demonstration of topics presented.  Learning Barriers/Preferences: Learning Barriers/Preferences - 04/11/17 1013      Learning Barriers/Preferences   Learning Barriers  Sight    Learning Preferences  Written Material;Video;Pictoral       Education Topics: Count Your Pulse:  -Group  instruction provided by verbal instruction, demonstration, patient  participation and written materials to support subject.  Instructors address importance of being able to find your pulse and how to count your pulse when at home without a heart monitor.  Patients get hands on experience counting their pulse with staff help and individually.   CARDIAC REHAB PHASE II EXERCISE from 07/31/2017 in [place]  Date  07/05/17  Educator  RN      Heart Attack, Angina, and Risk Factor Modification:  -Group instruction provided by verbal instruction, video, and written materials to support subject.  Instructors address signs and symptoms of angina and heart attacks.    Also discuss risk factors for heart disease and how to make changes to improve heart health risk factors.   CARDIAC REHAB PHASE II EXERCISE from 07/31/2017 in [place]  Date  06/12/17      Functional Fitness:  -Group instruction provided by verbal instruction, demonstration, patient participation, and written materials to support subject.  Instructors address safety measures for doing things around the house.  Discuss how to get up and down off the floor, how to pick things up properly, how to safely get out of a chair without assistance, and balance training.   CARDIAC REHAB PHASE II EXERCISE from 07/31/2017 in [place]  Date  04/26/17      Meditation and Mindfulness:  -Group instruction provided by verbal instruction, patient participation, and written materials to support subject.  Instructor addresses importance of mindfulness and meditation practice to help reduce stress and improve awareness.  Instructor also leads participants through a meditation exercise.    CARDIAC REHAB PHASE II EXERCISE from 07/31/2017 in [place]  Date  05/15/17      Stretching for Flexibility and Mobility:  -Group instruction provided by verbal instruction, patient participation, and written  materials to support subject.  Instructors lead participants through series of stretches that are designed to increase flexibility thus improving mobility.  These stretches are additional exercise for major muscle groups that are typically performed during regular warm up and cool down.   Hands Only CPR:  -Group verbal, video, and participation provides a basic overview of AHA guidelines for community CPR. Role-play of emergencies allow participants the opportunity to practice calling for help and chest compression technique with discussion of AED use.   CARDIAC REHAB PHASE II EXERCISE from 07/31/2017 in [place]  Date  07/12/17  Instruction Review Code  2- Demonstrated Understanding      Hypertension: -Group verbal and written instruction that provides a basic overview of hypertension including the most recent diagnostic guidelines, risk factor reduction with self-care instructions and medication management.   CARDIAC REHAB PHASE II EXERCISE from 07/31/2017 in [place]  Date  06/21/17       Nutrition I class: Heart Healthy Eating:  -Group instruction provided by PowerPoint slides, verbal discussion, and written materials to support subject matter. The instructor gives an explanation and review of the Therapeutic Lifestyle Changes diet recommendations, which includes a discussion on lipid goals, dietary fat, sodium, fiber, plant stanol/sterol esters, sugar, and the components of a well-balanced, healthy diet.   Nutrition II class: Lifestyle Skills:  -Group instruction provided by PowerPoint slides, verbal discussion, and written materials to support subject matter. The instructor gives an explanation and review of label reading, grocery shopping for heart health, heart healthy  recipe modifications, and ways to make healthier choices when eating out.   Diabetes Question & Answer:  -Group instruction provided by PowerPoint  slides, verbal discussion, and written materials to support subject matter. The instructor gives an explanation and review of diabetes co-morbidities, pre- and post-prandial blood glucose goals, pre-exercise blood glucose goals, signs, symptoms, and treatment of hypoglycemia and hyperglycemia, and foot care basics.   CARDIAC REHAB PHASE II EXERCISE from 07/31/2017 in [place]  Date  06/28/17  Educator  RD      Diabetes Blitz:  -Group instruction provided by PowerPoint slides, verbal discussion, and written materials to support subject matter. The instructor gives an explanation and review of the physiology behind type 1 and type 2 diabetes, diabetes medications and rational behind using different medications, pre- and post-prandial blood glucose recommendations and Hemoglobin A1c goals, diabetes diet, and exercise including blood glucose guidelines for exercising safely.    Portion Distortion:  -Group instruction provided by PowerPoint slides, verbal discussion, written materials, and food models to support subject matter. The instructor gives an explanation of serving size versus portion size, changes in portions sizes over the last 20 years, and what consists of a serving from each food group.   Stress Management:  -Group instruction provided by verbal instruction, video, and written materials to support subject matter.  Instructors review role of stress in heart disease and how to cope with stress positively.     CARDIAC REHAB PHASE II EXERCISE from 07/31/2017 in [place]  Date  06/05/17      Exercising on Your Own:  -Group instruction provided by verbal instruction, power point, and written materials to support subject.  Instructors discuss benefits of exercise, components of exercise, frequency and intensity of exercise, and end points for exercise.  Also discuss use of nitroglycerin and activating EMS.  Review options of  places to exercise outside of rehab.  Review guidelines for sex with heart disease.   CARDIAC REHAB PHASE II EXERCISE from 07/31/2017 in [place]  Date  06/26/17      Cardiac Drugs I:  -Group instruction provided by verbal instruction and written materials to support subject.  Instructor reviews cardiac drug classes: antiplatelets, anticoagulants, beta blockers, and statins.  Instructor discusses reasons, side effects, and lifestyle considerations for each drug class.   CARDIAC REHAB PHASE II EXERCISE from 07/31/2017 in [place]  Date  06/19/17      Cardiac Drugs II:  -Group instruction provided by verbal instruction and written materials to support subject.  Instructor reviews cardiac drug classes: angiotensin converting enzyme inhibitors (ACE-I), angiotensin II receptor blockers (ARBs), nitrates, and calcium channel blockers.  Instructor discusses reasons, side effects, and lifestyle considerations for each drug class.   Anatomy and Physiology of the Circulatory System:  Group verbal and written instruction and models provide basic cardiac anatomy and physiology, with the coronary electrical and arterial systems. Review of: AMI, Angina, Valve disease, Heart Failure, Peripheral Artery Disease, Cardiac Arrhythmia, Pacemakers, and the ICD.   CARDIAC REHAB PHASE II EXERCISE from 07/31/2017 in [place]  Date  07/31/17  Instruction Review Code  2- Demonstrated Understanding      Other Education:  -Group or individual verbal, written, or video instructions that support the educational goals of the cardiac rehab program.   Holiday Eating Survival Tips:  -Group instruction provided by PowerPoint slides, verbal discussion, and written materials  to support subject matter. The instructor gives patients tips, tricks, and techniques to help them not only survive but enjoy the holidays despite the  onslaught of food that accompanies the holidays.   Knowledge Questionnaire Score: Knowledge Questionnaire Score - 04/11/17 1149      Knowledge Questionnaire Score   Pre Score  19/24       Core Components/Risk Factors/Patient Goals at Admission: Personal Goals and Risk Factors at Admission - 04/11/17 1155      Core Components/Risk Factors/Patient Goals on Admission   Admit Weight  139 lb 5.3 oz (63.2 kg)    Goal Weight: Short Term  139 lb (63 kg)    Goal Weight: Long Term  139 lb (63 kg)       Core Components/Risk Factors/Patient Goals Review:  Goals and Risk Factor Review    Row Name 05/07/17 1617 05/28/17 1519 06/28/17 1434 07/23/17 1235       Core Components/Risk Factors/Patient Goals Review   Personal Goals Review  Weight Management/Obesity;Improve shortness of breath with ADL's;Lipids  Weight Management/Obesity;Improve shortness of breath with ADL's;Lipids  Weight Management/Obesity;Improve shortness of breath with ADL's;Lipids  Weight Management/Obesity;Improve shortness of breath with ADL's;Lipids    Review  pt making proactive efforts to decrease CAD RF and symptoms. pt actively participating in exercise and education offerings. pt has been walking at home with less angina.   pt making proactive efforts to decrease CAD RF and symptoms. pt actively participating in exercise and education offerings. pt has been walking at home with less angina.   pt making proactive efforts to decrease CAD RF and symptoms. pt actively participating in exercise and education offerings. pt has been walking at home with less angina.  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 pt pleased she has tolerated this well.    pt making proactive efforts to decrease CAD RF and symptoms. pt actively participating in exercise and education offerings. pt currenlty wearing event monitor for palpitations. pt and husband have acute health concerns that have decreased activity and increased health  related anxiety.      Expected Outcomes  pt will participate in CR exercise, nutrition and lifestyle modification education to reduce overall RF.   pt will participate in CR exercise, nutrition and lifestyle modification education to reduce overall RF.   pt will participate in CR exercise, nutrition and lifestyle modification education to reduce overall RF.   pt will participate in CR exercise, nutrition and lifestyle modification education to reduce overall RF.        Core Components/Risk Factors/Patient Goals at Discharge (Final Review):  Goals and Risk Factor Review - 07/23/17 1235      Core Components/Risk Factors/Patient Goals Review   Personal Goals Review  Weight Management/Obesity;Improve shortness of breath with ADL's;Lipids    Review  pt making proactive efforts to decrease CAD RF and symptoms. pt actively participating in exercise and education offerings. pt currenlty wearing event monitor for palpitations. pt and husband have acute health concerns that have decreased activity and increased health related anxiety.      Expected Outcomes  pt will participate in CR exercise, nutrition and lifestyle modification education to reduce overall RF.        ITP Comments: ITP Comments    Row Name 04/11/17 0932 04/22/17 1108 05/07/17 1616 05/28/17 1519 06/28/17 1433   ITP Comments  Dr Fransico Him, Medical Director  pt tolerated first group exercise session without difficulty.  30 day ITP review.  pt demonstrates eagerness to  participate in group exercise.  pt with good attendance and participation.   30 day ITP review.  pt demonstrates eagerness to participate in group exercise.  pt with good attendance and participation.   30 day ITP review.  pt demonstrates eagerness to participate in group exercise.  pt with good attendance and participation.    [place] Name 07/23/17 1234           ITP Comments  30 day ITP review.  pt demonstrates eagerness to participate in group exercise.  pt with good  attendance and participation.           Comments:

## 2017-08-02 ENCOUNTER — Ambulatory Visit (INDEPENDENT_AMBULATORY_CARE_PROVIDER_SITE_OTHER): Payer: Medicare Other | Admitting: Interventional Cardiology

## 2017-08-02 ENCOUNTER — Encounter (HOSPITAL_COMMUNITY): Payer: Medicare Other

## 2017-08-02 ENCOUNTER — Encounter: Payer: Self-pay | Admitting: Interventional Cardiology

## 2017-08-02 VITALS — BP 138/66 | HR 70 | Ht 62.0 in | Wt 140.6 lb

## 2017-08-02 DIAGNOSIS — E785 Hyperlipidemia, unspecified: Secondary | ICD-10-CM | POA: Diagnosis not present

## 2017-08-02 DIAGNOSIS — I480 Paroxysmal atrial fibrillation: Secondary | ICD-10-CM

## 2017-08-02 DIAGNOSIS — I209 Angina pectoris, unspecified: Secondary | ICD-10-CM

## 2017-08-02 MED ORDER — ISOSORBIDE MONONITRATE ER 60 MG PO TB24: 60.0000 mg | ORAL_TABLET | Freq: Every day | ORAL | 3 refills | Status: DC

## 2017-08-02 NOTE — Progress Notes (Signed)
Cardiology Office Note    Date:  08/02/2017   ID:  Sherry Barnett, Sherry Barnett [birth year], MRN [id number]  PCP:  Lajean Manes, MD  Cardiologist: Sinclair Grooms, MD   Chief Complaint  Patient presents with  . Coronary Artery Disease    History of Present Illness:  Sherry Barnett is a 84 y.o. female exertional chest and jaw discomfort which upon evaluation with coronary angiography in October 2018 identified high-grade obstruction mid [name] treated with a drug-eluting stent, prior history of paroxysmal/episodic atrial fibrillation but no documenting data available and on no anticoagulation therapy.   She underwent PCI of the proximal [name] in October 2018.  Over the last 4-6 weeks she has had recurring episodes of exertion related and rest discomfort.  The episodes occurring at rest or waves of tightness in the chest that lasts 30 seconds to a minute.  They had a different quality than exertion related discomfort.  She has had difficulty completing some rehabilitation sessions because of chest discomfort.   Past Medical History:  Diagnosis Date  . A-fib ([place])   . Bilateral cataracts   . Blepharitis    chronic  . CAD (coronary artery disease), native coronary artery    10/18 PCI/DES to p/mLAD, normal EF  . Cardiovascular risk factor    23%  . Degenerative joint disease (DJD) of lumbar spine   . Diverticulosis   . Endometriosis   . Fibroids   . Hematuria, microscopic [id number]  . Hemorrhoids   . Hypercholesterolemia    10 years  . Osteopenia 03/2016   start alendrodate  . Renal disease    stage2/stage 3    Past Surgical History:  Procedure Laterality Date  . APPENDECTOMY    . CATARACT EXTRACTION, BILATERAL Bilateral 2001 & 11/2016  . CORONARY STENT INTERVENTION N/A 03/14/2017   Procedure: CORONARY STENT INTERVENTION;  Surgeon: Belva Crome, MD;  Location: [facility];  Service: Cardiovascular;  Laterality: N/A;  . LEFT HEART CATH AND CORONARY ANGIOGRAPHY N/A 03/14/2017   Procedure: LEFT HEART CATH AND CORONARY ANGIOGRAPHY;  Surgeon: Belva Crome, MD;  Location: [facility];  Service: Cardiovascular;  Laterality: N/A;  . ovarian cysts Bilateral   . tubes removal Bilateral    endometriosis    Current Medications: Outpatient Medications Prior to Visit  Medication Sig Dispense Refill  . alendronate (FOSAMAX) 70 MG tablet Take 70 mg by mouth every Saturday. Take with a full glass of water on an empty stomach.     . Artificial Tear Ointment (ARTIFICIAL TEARS) ointment Place 1 drop into both eyes as needed (dry eyes).     Marland Kitchen aspirin 81 MG EC tablet Take 81 mg by mouth daily with breakfast. Swallow whole.    Marland Kitchen atorvastatin (LIPITOR) 80 MG tablet TAKE 1 TABLET BY MOUTH EVERY DAY AT 6:00 PM 90 tablet 3  . Azelaic Acid (FINACEA) 15 % cream Apply 1 application topically at bedtime. After skin is thoroughly washed and patted dry, gently but thoroughly massage a thin film of azelaic acid cream into the affected area twice daily, in the morning and evening.     . calcium carbonate (TUMS EX) 750 MG chewable tablet Chew 1 tablet by mouth daily.    . cholecalciferol (VITAMIN D) 1000 units tablet Take 1,000 Units by mouth daily.    . clopidogrel (PLAVIX) 75 MG tablet Take 1 tablet (75 mg total) by mouth daily. 90 tablet 3  . metoprolol succinate (TOPROL-XL) 50 MG  24 hr tablet Take 1 tablet (50 mg total) by mouth daily. Take with or immediately following a meal. 90 tablet 3  . pantoprazole (PROTONIX) 40 MG tablet Take 1 tablet (40 mg total) by mouth daily. 30 tablet 6  . tobramycin-dexamethasone (TOBRADEX) ophthalmic ointment Place 1 application into both eyes See admin instructions. Tuesday AND Saturday patient does not use. Will apply at night on all other nights.    . isosorbide mononitrate (IMDUR) 30 MG 24 hr tablet Take 1 tablet (30 mg total) by mouth daily. 90 tablet 3  . nitroGLYCERIN (NITROSTAT) 0.4 MG SL tablet Place 1 tablet (0.4 mg total) under the tongue every 5  (five) minutes as needed for chest pain. 25 tablet 3   No facility-administered medications prior to visit.      Allergies:   Sulfa antibiotics; Novocain [procaine]; and Penicillins   Social History   Socioeconomic History  . Marital status: Single    Spouse name: None  . Number of children: None  . Years of education: None  . Highest education level: None  Social Needs  . Financial resource strain: None  . Food insecurity - worry: None  . Food insecurity - inability: None  . Transportation needs - medical: None  . Transportation needs - non-medical: None  Occupational History  . None  Tobacco Use  . Smoking status: Former Smoker    Years: 5.00    Types: Cigarettes    Last attempt to quit: 02/06/1969    Years since quitting: 48.5  . Smokeless tobacco: Never Used  Substance and Sexual Activity  . Alcohol use: No  . Drug use: No  . Sexual activity: None    Comment: MARRIED  Other Topics Concern  . None  Social History Narrative  . None     Family History:  The patient's family history includes Alzheimer's disease in her father; Heart attack in her mother; Hypertension in her father; Other (age of onset: 85) in her sister; Stroke in her father.   ROS:   Please see the history of present illness.    Waking up at night short of breath, irregular heartbeats, has history of PAF.  Has worn a monitor without evidence of PAF. All other systems reviewed and are negative.   PHYSICAL EXAM:   VS:  BP 138/66   Pulse 70   Ht 5\' 2"  (1.575 m)   Wt 140 lb 9.6 oz (63.8 kg)   BMI 25.72 kg/m    GEN: Well nourished, well developed, in no acute distress  HEENT: normal  Neck: no JVD, carotid bruits, or masses Cardiac: RRR; no murmurs, rubs, or gallops,no edema  Respiratory:  clear to auscultation bilaterally, normal work of breathing GI: soft, nontender, nondistended, + BS MS: no deformity or atrophy  Skin: warm and dry, no rash Neuro:  Alert and Oriented x 3, Strength and  sensation are intact Psych: euthymic mood, full affect  Wt Readings from Last 3 Encounters:  08/02/17 140 lb 9.6 oz (63.8 kg)  07/24/17 138 lb 7.2 oz (62.8 kg)  07/17/17 140 lb (63.5 kg)      Studies/Labs Reviewed:   EKG:  EKG not repeated  Recent Labs: 05/23/2017: ALT 34 07/17/2017: BUN 14; Creatinine, Ser 0.87; Hemoglobin 12.3; Platelets 187; Potassium 3.7; Sodium 138   Lipid Panel    Component Value Date/Time   CHOL 117 05/06/2017 0806   TRIG 74 05/06/2017 0806   HDL 57 05/06/2017 0806   CHOLHDL 2.1 05/06/2017 0806  [place] 45 05/06/2017 0806    Additional studies/ records that were reviewed today include:  No new data    ASSESSMENT:    1. Angina pectoris ([place])   2. Paroxysmal atrial fibrillation ([place])   3. Hyperlipidemia LDL goal <70      PLAN:  In order of problems listed above:  1. Digital images following PCI were reviewed for the purposes of today's visit.  She has downstream distal [name] disease beyond the site of stent implantation.  There was moderate mid circumflex disease.  Right coronary was totally normal.  Plan to increase isosorbide to 60 mg/day.  Continue metoprolol.  Recommended coronary angiography to define anatomy and rule out in-stent restenosis versus de novo progression of disease.  While seated and should remain seated for at least 10 minutes after last nitroglycerin tablet. 2. Monitor has been done to exclude PAF as a source of some of the more recent symptoms.  This does not appear to be the case.   I have recommended nitroglycerin use on a more liberal basis for any episodes of discomfort lasting greater than 5 minutes.  She will be taken  The patient was counseled to undergo left heart catheterization, coronary angiography, and possible percutaneous coronary intervention with stent implantation. The procedural risks and benefits were discussed in detail. The risks discussed included death, stroke, myocardial infarction, life-threatening  bleeding, limb ischemia, kidney injury, allergy, and possible emergency cardiac surgery. The risk of these significant complications were estimated to occur less than 1% of the time. After discussion, the patient has agreed to proceed.  Medication Adjustments/Labs and Tests Ordered: Current medicines are reviewed at length with the patient today.  Concerns regarding medicines are outlined above.  Medication changes, Labs and Tests ordered today are listed in the Patient Instructions below. Patient Instructions  Medication Instructions:  1) INCREASE Isosorbide to 60mg  once daily  Labwork: BMET, CBC and INR today  Testing/Procedures: Your physician has requested that you have a cardiac catheterization. Cardiac catheterization is used to diagnose and/or treat various heart conditions. Doctors may recommend this procedure for a number of different reasons. The most common reason is to evaluate chest pain. Chest pain can be a symptom of coronary artery disease (CAD), and cardiac catheterization can show whether plaque is narrowing or blocking your heart's arteries. This procedure is also used to evaluate the valves, as well as measure the blood flow and oxygen levels in different parts of your heart. For further information please visit [email or web address]. Please follow instruction sheet, as given.   Follow-Up: Your physician recommends that you schedule a follow-up appointment in: 2 weeks after your heart cath (cath scheduled 3/5) with PA or NP.   Any Other Special Instructions Will Be Listed Below (If Applicable).    [facility] [address] [place] [address] Dept: [phone] Loc: [phone]  Sherry Barnett  08/02/2017  You are scheduled for a Cardiac Catheterization on Tuesday, March 5 with Dr. Daneen Schick.  1. Please arrive at the [facility] (Main Entrance A) at [facility]: [address] [place], [place] [address] at 8:00 AM (two hours before your procedure to ensure your preparation). Free valet parking service is available.   Special note: Every effort is made to have your procedure done on time. Please understand that emergencies sometimes delay scheduled procedures.  2. Diet: Do not eat or drink anything after midnight prior to your procedure  except sips of water to take medications.  3. Labs: You will have labs drawn today  4. Medication instructions in preparation for your procedure:   On the morning of your procedure, take your Aspirin and Plavix/Clopidogrel and any morning medicines NOT listed above.  You may use sips of water.  5. Plan for one night stay--bring personal belongings. 6. Bring a current list of your medications and current insurance cards. 7. You MUST have a responsible person to drive you home. 8. Someone MUST be with you the first 24 hours after you arrive home or your discharge will be delayed. 9. Please wear clothes that are easy to get on and off and wear slip-on shoes.  Thank you for allowing [place] to care for you!   -- [facility]    If you need a refill on your cardiac medications before your next appointment, please call your pharmacy.      Signed, Sinclair Grooms, MD  08/02/2017 12:58 PM    [facility] [place], [place], [place]  [address] Phone: [phone]; Fax: [phone]

## 2017-08-02 NOTE — Patient Instructions (Signed)
Medication Instructions:  1) INCREASE Isosorbide to 60mg  once daily  Labwork: BMET, CBC and INR today  Testing/Procedures: Your physician has requested that you have a cardiac catheterization. Cardiac catheterization is used to diagnose and/or treat various heart conditions. Doctors may recommend this procedure for a number of different reasons. The most common reason is to evaluate chest pain. Chest pain can be a symptom of coronary artery disease (CAD), and cardiac catheterization can show whether plaque is narrowing or blocking your heart's arteries. This procedure is also used to evaluate the valves, as well as measure the blood flow and oxygen levels in different parts of your heart. For further information please visit [email or web address]. Please follow instruction sheet, as given.   Follow-Up: Your physician recommends that you schedule a follow-up appointment in: 2 weeks after your heart cath (cath scheduled 3/5) with PA or NP.   Any Other Special Instructions Will Be Listed Below (If Applicable).    [facility] [address], [address] [place] [address] Dept: [phone] Loc: [phone]  NIKIYAH FACKLER  08/02/2017  You are scheduled for a Cardiac Catheterization on Tuesday, March 5 with Dr. Daneen Schick.  1. Please arrive at the [facility] (Main Entrance A) at [facility]: [address] [place], [place] [address] at 8:00 AM (two hours before your procedure to ensure your preparation). Free valet parking service is available.   Special note: Every effort is made to have your procedure done on time. Please understand that emergencies sometimes delay scheduled procedures.  2. Diet: Do not eat or drink anything after midnight prior to your procedure except sips of water to take medications.  3. Labs: You will have labs drawn today  4. Medication instructions in preparation for  your procedure:   On the morning of your procedure, take your Aspirin and Plavix/Clopidogrel and any morning medicines NOT listed above.  You may use sips of water.  5. Plan for one night stay--bring personal belongings. 6. Bring a current list of your medications and current insurance cards. 7. You MUST have a responsible person to drive you home. 8. Someone MUST be with you the first 24 hours after you arrive home or your discharge will be delayed. 9. Please wear clothes that are easy to get on and off and wear slip-on shoes.  Thank you for allowing [place] to care for you!   -- [facility]    If you need a refill on your cardiac medications before your next appointment, please call your pharmacy.

## 2017-08-02 NOTE — H&P (View-Only) (Signed)
Cardiology Office Note    Date:  08/02/2017   ID:  Sherry Barnett, Sherry Barnett 05-01-1933, MRN [id number]  PCP:  Lajean Manes, MD  Cardiologist: Sinclair Grooms, MD   Chief Complaint  Patient presents with  . Coronary Artery Disease    History of Present Illness:  Sherry Barnett is a 84 y.o. female exertional chest and jaw discomfort which upon evaluation with coronary angiography in October 2018 identified high-grade obstruction mid [name] treated with a drug-eluting stent, prior history of paroxysmal/episodic atrial fibrillation but no documenting data available and on no anticoagulation therapy.   She underwent PCI of the proximal [name] in October 2018.  Over the last 4-6 weeks she has had recurring episodes of exertion related and rest discomfort.  The episodes occurring at rest or waves of tightness in the chest that lasts 30 seconds to a minute.  They had a different quality than exertion related discomfort.  She has had difficulty completing some rehabilitation sessions because of chest discomfort.   Past Medical History:  Diagnosis Date  . A-fib ([place])   . Bilateral cataracts   . Blepharitis    chronic  . CAD (coronary artery disease), native coronary artery    10/18 PCI/DES to p/mLAD, normal EF  . Cardiovascular risk factor    23%  . Degenerative joint disease (DJD) of lumbar spine   . Diverticulosis   . Endometriosis   . Fibroids   . Hematuria, microscopic [id number]  . Hemorrhoids   . Hypercholesterolemia    10 years  . Osteopenia 03/2016   start alendrodate  . Renal disease    stage2/stage 3    Past Surgical History:  Procedure Laterality Date  . APPENDECTOMY    . CATARACT EXTRACTION, BILATERAL Bilateral 2001 & 11/2016  . CORONARY STENT INTERVENTION N/A 03/14/2017   Procedure: CORONARY STENT INTERVENTION;  Surgeon: Belva Crome, MD;  Location: [facility];  Service: Cardiovascular;  Laterality: N/A;  . LEFT HEART CATH AND CORONARY ANGIOGRAPHY N/A 03/14/2017   Procedure: LEFT HEART CATH AND CORONARY ANGIOGRAPHY;  Surgeon: Belva Crome, MD;  Location: [facility];  Service: Cardiovascular;  Laterality: N/A;  . ovarian cysts Bilateral   . tubes removal Bilateral    endometriosis    Current Medications: Outpatient Medications Prior to Visit  Medication Sig Dispense Refill  . alendronate (FOSAMAX) 70 MG tablet Take 70 mg by mouth every Saturday. Take with a full glass of water on an empty stomach.     . Artificial Tear Ointment (ARTIFICIAL TEARS) ointment Place 1 drop into both eyes as needed (dry eyes).     Marland Kitchen aspirin 81 MG EC tablet Take 81 mg by mouth daily with breakfast. Swallow whole.    Marland Kitchen atorvastatin (LIPITOR) 80 MG tablet TAKE 1 TABLET BY MOUTH EVERY DAY AT 6:00 PM 90 tablet 3  . Azelaic Acid (FINACEA) 15 % cream Apply 1 application topically at bedtime. After skin is thoroughly washed and patted dry, gently but thoroughly massage a thin film of azelaic acid cream into the affected area twice daily, in the morning and evening.     . calcium carbonate (TUMS EX) 750 MG chewable tablet Chew 1 tablet by mouth daily.    . cholecalciferol (VITAMIN D) 1000 units tablet Take 1,000 Units by mouth daily.    . clopidogrel (PLAVIX) 75 MG tablet Take 1 tablet (75 mg total) by mouth daily. 90 tablet 3  . metoprolol succinate (TOPROL-XL) 50 MG  24 hr tablet Take 1 tablet (50 mg total) by mouth daily. Take with or immediately following a meal. 90 tablet 3  . pantoprazole (PROTONIX) 40 MG tablet Take 1 tablet (40 mg total) by mouth daily. 30 tablet 6  . tobramycin-dexamethasone (TOBRADEX) ophthalmic ointment Place 1 application into both eyes See admin instructions. Tuesday AND Saturday patient does not use. Will apply at night on all other nights.    . isosorbide mononitrate (IMDUR) 30 MG 24 hr tablet Take 1 tablet (30 mg total) by mouth daily. 90 tablet 3  . nitroGLYCERIN (NITROSTAT) 0.4 MG SL tablet Place 1 tablet (0.4 mg total) under the tongue every 5  (five) minutes as needed for chest pain. 25 tablet 3   No facility-administered medications prior to visit.      Allergies:   Sulfa antibiotics; Novocain [procaine]; and Penicillins   Social History   Socioeconomic History  . Marital status: Single    Spouse name: None  . Number of children: None  . Years of education: None  . Highest education level: None  Social Needs  . Financial resource strain: None  . Food insecurity - worry: None  . Food insecurity - inability: None  . Transportation needs - medical: None  . Transportation needs - non-medical: None  Occupational History  . None  Tobacco Use  . Smoking status: Former Smoker    Years: 5.00    Types: Cigarettes    Last attempt to quit: 02/06/1969    Years since quitting: 48.5  . Smokeless tobacco: Never Used  Substance and Sexual Activity  . Alcohol use: No  . Drug use: No  . Sexual activity: None    Comment: MARRIED  Other Topics Concern  . None  Social History Narrative  . None     Family History:  The patient's family history includes Alzheimer's disease in her father; Heart attack in her mother; Hypertension in her father; Other (age of onset: 57) in her sister; Stroke in her father.   ROS:   Please see the history of present illness.    Waking up at night short of breath, irregular heartbeats, has history of PAF.  Has worn a monitor without evidence of PAF. All other systems reviewed and are negative.   PHYSICAL EXAM:   VS:  BP 138/66   Pulse 70   Ht 5\' 2"  (1.575 m)   Wt 140 lb 9.6 oz (63.8 kg)   BMI 25.72 kg/m    GEN: Well nourished, well developed, in no acute distress  HEENT: normal  Neck: no JVD, carotid bruits, or masses Cardiac: RRR; no murmurs, rubs, or gallops,no edema  Respiratory:  clear to auscultation bilaterally, normal work of breathing GI: soft, nontender, nondistended, + BS MS: no deformity or atrophy  Skin: warm and dry, no rash Neuro:  Alert and Oriented x 3, Strength and  sensation are intact Psych: euthymic mood, full affect  Wt Readings from Last 3 Encounters:  08/02/17 140 lb 9.6 oz (63.8 kg)  07/24/17 138 lb 7.2 oz (62.8 kg)  07/17/17 140 lb (63.5 kg)      Studies/Labs Reviewed:   EKG:  EKG not repeated  Recent Labs: 05/23/2017: ALT 34 07/17/2017: BUN 14; Creatinine, Ser 0.87; Hemoglobin 12.3; Platelets 187; Potassium 3.7; Sodium 138   Lipid Panel    Component Value Date/Time   CHOL 117 05/06/2017 0806   TRIG 74 05/06/2017 0806   HDL 57 05/06/2017 0806   CHOLHDL 2.1 05/06/2017 0806  [place] 45 05/06/2017 0806    Additional studies/ records that were reviewed today include:  No new data    ASSESSMENT:    1. Angina pectoris ([place])   2. Paroxysmal atrial fibrillation ([place])   3. Hyperlipidemia LDL goal <70      PLAN:  In order of problems listed above:  1. Digital images following PCI were reviewed for the purposes of today's visit.  She has downstream distal [name] disease beyond the site of stent implantation.  There was moderate mid circumflex disease.  Right coronary was totally normal.  Plan to increase isosorbide to 60 mg/day.  Continue metoprolol.  Recommended coronary angiography to define anatomy and rule out in-stent restenosis versus de novo progression of disease.  While seated and should remain seated for at least 10 minutes after last nitroglycerin tablet. 2. Monitor has been done to exclude PAF as a source of some of the more recent symptoms.  This does not appear to be the case.   I have recommended nitroglycerin use on a more liberal basis for any episodes of discomfort lasting greater than 5 minutes.  She will be taken  The patient was counseled to undergo left heart catheterization, coronary angiography, and possible percutaneous coronary intervention with stent implantation. The procedural risks and benefits were discussed in detail. The risks discussed included death, stroke, myocardial infarction, life-threatening  bleeding, limb ischemia, kidney injury, allergy, and possible emergency cardiac surgery. The risk of these significant complications were estimated to occur less than 1% of the time. After discussion, the patient has agreed to proceed.  Medication Adjustments/Labs and Tests Ordered: Current medicines are reviewed at length with the patient today.  Concerns regarding medicines are outlined above.  Medication changes, Labs and Tests ordered today are listed in the Patient Instructions below. Patient Instructions  Medication Instructions:  1) INCREASE Isosorbide to 60mg  once daily  Labwork: BMET, CBC and INR today  Testing/Procedures: Your physician has requested that you have a cardiac catheterization. Cardiac catheterization is used to diagnose and/or treat various heart conditions. Doctors may recommend this procedure for a number of different reasons. The most common reason is to evaluate chest pain. Chest pain can be a symptom of coronary artery disease (CAD), and cardiac catheterization can show whether plaque is narrowing or blocking your heart's arteries. This procedure is also used to evaluate the valves, as well as measure the blood flow and oxygen levels in different parts of your heart. For further information please visit [email or web address]. Please follow instruction sheet, as given.   Follow-Up: Your physician recommends that you schedule a follow-up appointment in: 2 weeks after your heart cath (cath scheduled 3/5) with PA or NP.   Any Other Special Instructions Will Be Listed Below (If Applicable).    [facility] [address] [place] [address] Dept: [phone] Loc: [phone]  Sherry Barnett  08/02/2017  You are scheduled for a Cardiac Catheterization on Tuesday, March 5 with Dr. Daneen Schick.  1. Please arrive at the [facility] (Main Entrance A) at [facility]: [address] [place], [place] [address] at 8:00 AM (two hours before your procedure to ensure your preparation). Free valet parking service is available.   Special note: Every effort is made to have your procedure done on time. Please understand that emergencies sometimes delay scheduled procedures.  2. Diet: Do not eat or drink anything after midnight prior to your procedure  except sips of water to take medications.  3. Labs: You will have labs drawn today  4. Medication instructions in preparation for your procedure:   On the morning of your procedure, take your Aspirin and Plavix/Clopidogrel and any morning medicines NOT listed above.  You may use sips of water.  5. Plan for one night stay--bring personal belongings. 6. Bring a current list of your medications and current insurance cards. 7. You MUST have a responsible person to drive you home. 8. Someone MUST be with you the first 24 hours after you arrive home or your discharge will be delayed. 9. Please wear clothes that are easy to get on and off and wear slip-on shoes.  Thank you for allowing [place] to care for you!   -- [facility]    If you need a refill on your cardiac medications before your next appointment, please call your pharmacy.      Signed, Sinclair Grooms, MD  08/02/2017 12:58 PM    [facility] [place], [place], [place]  [address] Phone: [phone]; Fax: [phone]

## 2017-08-03 LAB — CBC
HEMATOCRIT: 40 % (ref 34.0–46.6)
HEMOGLOBIN: 13.1 g/dL (ref 11.1–15.9)
MCH: 30.8 pg (ref 26.6–33.0)
MCHC: 32.8 g/dL (ref 31.5–35.7)
MCV: 94 fL (ref 79–97)
Platelets: 222 10*3/uL (ref 150–379)
RBC: 4.25 x10E6/uL (ref 3.77–5.28)
RDW: 14.4 % (ref 12.3–15.4)
WBC: 7.7 10*3/uL (ref 3.4–10.8)

## 2017-08-03 LAB — BASIC METABOLIC PANEL
BUN/Creatinine Ratio: 15 (ref 12–28)
BUN: 13 mg/dL (ref 8–27)
CALCIUM: 8.9 mg/dL (ref 8.7–10.3)
CHLORIDE: 102 mmol/L (ref 96–106)
CO2: 26 mmol/L (ref 20–29)
Creatinine, Ser: 0.85 mg/dL (ref 0.57–1.00)
GFR calc Af Amer: 74 mL/min/{1.73_m2} (ref >59)
GFR calc non Af Amer: 64 mL/min/{1.73_m2} (ref >59)
GLUCOSE: 93 mg/dL (ref 65–99)
POTASSIUM: 4.8 mmol/L (ref 3.5–5.2)
Sodium: 142 mmol/L (ref 134–144)

## 2017-08-03 LAB — PROTIME-INR
INR: 1 (ref 0.8–1.2)
PROTHROMBIN TIME: 10.7 s (ref 9.1–12.0)

## 2017-08-05 ENCOUNTER — Encounter (HOSPITAL_COMMUNITY): Payer: Medicare Other

## 2017-08-07 ENCOUNTER — Encounter (HOSPITAL_COMMUNITY): Payer: Medicare Other

## 2017-08-12 ENCOUNTER — Ambulatory Visit: Payer: Medicare Other | Admitting: Interventional Cardiology

## 2017-08-12 ENCOUNTER — Telehealth: Payer: Self-pay | Admitting: *Deleted

## 2017-08-12 NOTE — Telephone Encounter (Signed)
Pt contacted pre-catheterization scheduled at [facility] for: Tuesday March 5,2019 10:30 AM Verified arrival time and place: [place] Entrance A/[place] at: 8 AM  Nothing to eat or drink after midnight prior to cath. Verified no diabetes medications. Verified allergies in Epic.  AM meds can be  taken pre-cath with sip of water including: ASA 81 mg AM of cath Clopidogrel 75 mg AM of cath   Confirmed patient has responsible person to drive home post procedure and observe patient for 24 hours: yes

## 2017-08-13 ENCOUNTER — Other Ambulatory Visit: Payer: Self-pay

## 2017-08-13 ENCOUNTER — Encounter (HOSPITAL_COMMUNITY): Payer: Self-pay | Admitting: General Practice

## 2017-08-13 ENCOUNTER — Observation Stay (HOSPITAL_COMMUNITY)
Admission: RE | Admit: 2017-08-13 | Discharge: 2017-08-14 | Disposition: A | Discharge status: Home / Self Care | Payer: Medicare Other | Source: Ambulatory Visit | Attending: Interventional Cardiology | Admitting: Interventional Cardiology

## 2017-08-13 ENCOUNTER — Encounter (HOSPITAL_COMMUNITY)
Admission: RE | Disposition: A | Discharge status: Home / Self Care | Payer: Self-pay | Source: Ambulatory Visit | Attending: Interventional Cardiology

## 2017-08-13 DIAGNOSIS — Z882 Allergy status to sulfonamides status: Secondary | ICD-10-CM | POA: Insufficient documentation

## 2017-08-13 DIAGNOSIS — Z7982 Long term (current) use of aspirin: Secondary | ICD-10-CM | POA: Insufficient documentation

## 2017-08-13 DIAGNOSIS — Z7902 Long term (current) use of antithrombotics/antiplatelets: Secondary | ICD-10-CM | POA: Diagnosis not present

## 2017-08-13 DIAGNOSIS — I2584 Coronary atherosclerosis due to calcified coronary lesion: Secondary | ICD-10-CM | POA: Insufficient documentation

## 2017-08-13 DIAGNOSIS — I480 Paroxysmal atrial fibrillation: Secondary | ICD-10-CM | POA: Diagnosis not present

## 2017-08-13 DIAGNOSIS — E7800 Pure hypercholesterolemia, unspecified: Secondary | ICD-10-CM | POA: Diagnosis not present

## 2017-08-13 DIAGNOSIS — Z87891 Personal history of nicotine dependence: Secondary | ICD-10-CM | POA: Insufficient documentation

## 2017-08-13 DIAGNOSIS — R55 Syncope and collapse: Secondary | ICD-10-CM | POA: Diagnosis present

## 2017-08-13 DIAGNOSIS — I25119 Atherosclerotic heart disease of native coronary artery with unspecified angina pectoris: Principal | ICD-10-CM | POA: Insufficient documentation

## 2017-08-13 DIAGNOSIS — I2510 Atherosclerotic heart disease of native coronary artery without angina pectoris: Secondary | ICD-10-CM

## 2017-08-13 DIAGNOSIS — M8580 Other specified disorders of bone density and structure, unspecified site: Secondary | ICD-10-CM | POA: Insufficient documentation

## 2017-08-13 DIAGNOSIS — Z955 Presence of coronary angioplasty implant and graft: Secondary | ICD-10-CM | POA: Insufficient documentation

## 2017-08-13 DIAGNOSIS — M479 Spondylosis, unspecified: Secondary | ICD-10-CM | POA: Diagnosis not present

## 2017-08-13 DIAGNOSIS — Z880 Allergy status to penicillin: Secondary | ICD-10-CM | POA: Insufficient documentation

## 2017-08-13 DIAGNOSIS — I209 Angina pectoris, unspecified: Secondary | ICD-10-CM | POA: Diagnosis present

## 2017-08-13 DIAGNOSIS — E785 Hyperlipidemia, unspecified: Secondary | ICD-10-CM | POA: Diagnosis present

## 2017-08-13 HISTORY — DX: Other specified postprocedural states: Z98.890

## 2017-08-13 HISTORY — PX: CARDIAC CATHETERIZATION: SHX172

## 2017-08-13 HISTORY — PX: LEFT HEART CATH AND CORONARY ANGIOGRAPHY: CATH118249

## 2017-08-13 HISTORY — DX: Gastro-esophageal reflux disease without esophagitis: K21.9

## 2017-08-13 HISTORY — DX: Nausea with vomiting, unspecified: R11.2

## 2017-08-13 HISTORY — DX: Unspecified osteoarthritis, unspecified site: M19.90

## 2017-08-13 HISTORY — DX: Anemia, unspecified: D64.9

## 2017-08-13 LAB — CBC
HCT: 37.8 % (ref 36.0–46.0)
Hemoglobin: 12 g/dL (ref 12.0–15.0)
MCH: 29.9 pg (ref 26.0–34.0)
MCHC: 31.7 g/dL (ref 30.0–36.0)
MCV: 94 fL (ref 78.0–100.0)
PLATELETS: 168 10*3/uL (ref 150–400)
RBC: 4.02 MIL/uL (ref 3.87–5.11)
RDW: 14.4 % (ref 11.5–15.5)
WBC: 12.1 10*3/uL — ABNORMAL HIGH (ref 4.0–10.5)

## 2017-08-13 LAB — CREATININE, SERUM
Creatinine, Ser: 0.89 mg/dL (ref 0.44–1.00)
GFR calc Af Amer: >60 mL/min
GFR calc non Af Amer: 58 mL/min — ABNORMAL LOW

## 2017-08-13 LAB — POCT ACTIVATED CLOTTING TIME: ACTIVATED CLOTTING TIME: 274 s

## 2017-08-13 SURGERY — LEFT HEART CATH AND CORONARY ANGIOGRAPHY
Anesthesia: LOCAL

## 2017-08-13 MED ORDER — SODIUM CHLORIDE 0.9 % WEIGHT BASED INFUSION
3.0000 mL/kg/h | INTRAVENOUS | Status: DC
  Administered 2017-08-13: 3 mL/kg/h via INTRAVENOUS

## 2017-08-13 MED ORDER — ATROPINE SULFATE 1 MG/10ML IJ SOSY
PREFILLED_SYRINGE | INTRAMUSCULAR | Status: AC
  Filled 2017-08-13: qty 10

## 2017-08-13 MED ORDER — ONDANSETRON HCL 4 MG/2ML IJ SOLN
INTRAMUSCULAR | Status: AC
  Administered 2017-08-13: 4 mg via INTRAVENOUS
  Filled 2017-08-13: qty 2

## 2017-08-13 MED ORDER — SODIUM CHLORIDE 0.9% FLUSH: 3.0000 mL | Freq: Two times a day (BID) | INTRAVENOUS | Status: DC

## 2017-08-13 MED ORDER — SODIUM CHLORIDE 0.9% FLUSH: 3.0000 mL | INTRAVENOUS | Status: DC | PRN

## 2017-08-13 MED ORDER — MIDAZOLAM HCL 2 MG/2ML IJ SOLN
INTRAMUSCULAR | Status: DC | PRN
  Administered 2017-08-13: 1 mg via INTRAVENOUS

## 2017-08-13 MED ORDER — VERAPAMIL HCL 2.5 MG/ML IV SOLN
INTRAVENOUS | Status: AC
  Filled 2017-08-13: qty 2

## 2017-08-13 MED ORDER — ASPIRIN EC 81 MG PO TBEC: 81.0000 mg | DELAYED_RELEASE_TABLET | Freq: Every day | ORAL | Status: DC

## 2017-08-13 MED ORDER — REFRESH P.M. OP OINT: 1.0000 [drp] | TOPICAL_OINTMENT | Freq: Four times a day (QID) | OPHTHALMIC | Status: DC | PRN

## 2017-08-13 MED ORDER — ASPIRIN 81 MG PO CHEW: 81.0000 mg | CHEWABLE_TABLET | ORAL | Status: DC

## 2017-08-13 MED ORDER — SODIUM CHLORIDE 0.9 % IV SOLN: INTRAVENOUS | Status: AC

## 2017-08-13 MED ORDER — ASPIRIN EC 81 MG PO TBEC
81.0000 mg | DELAYED_RELEASE_TABLET | Freq: Every day | ORAL | Status: DC
  Administered 2017-08-14: 09:00:00 81 mg via ORAL
  Filled 2017-08-13: qty 1

## 2017-08-13 MED ORDER — ARTIFICIAL TEARS OPHTHALMIC OINT: TOPICAL_OINTMENT | Freq: Four times a day (QID) | OPHTHALMIC | Status: DC | PRN

## 2017-08-13 MED ORDER — IOPAMIDOL (ISOVUE-370) INJECTION 76%
INTRAVENOUS | Status: AC
  Filled 2017-08-13: qty 100

## 2017-08-13 MED ORDER — CLOPIDOGREL BISULFATE 75 MG PO TABS
75.0000 mg | ORAL_TABLET | Freq: Every day | ORAL | Status: DC
  Administered 2017-08-14: 09:00:00 75 mg via ORAL
  Filled 2017-08-13: qty 1

## 2017-08-13 MED ORDER — ONDANSETRON HCL 4 MG/2ML IJ SOLN
4.0000 mg | Freq: Four times a day (QID) | INTRAMUSCULAR | Status: DC | PRN
  Administered 2017-08-13: 4 mg via INTRAVENOUS

## 2017-08-13 MED ORDER — IOPAMIDOL (ISOVUE-370) INJECTION 76%
INTRAVENOUS | Status: AC
  Filled 2017-08-13: qty 50

## 2017-08-13 MED ORDER — SODIUM CHLORIDE 0.9 % WEIGHT BASED INFUSION: 1.0000 mL/kg/h | INTRAVENOUS | Status: DC

## 2017-08-13 MED ORDER — CALCIUM CARBONATE ANTACID 500 MG PO CHEW: 1.0000 | CHEWABLE_TABLET | Freq: Two times a day (BID) | ORAL | Status: DC | PRN

## 2017-08-13 MED ORDER — HEPARIN (PORCINE) IN NACL 2-0.9 UNIT/ML-% IJ SOLN
INTRAMUSCULAR | Status: AC | PRN
  Administered 2017-08-13: 1000 mL via INTRA_ARTERIAL

## 2017-08-13 MED ORDER — ATORVASTATIN CALCIUM 80 MG PO TABS
80.0000 mg | ORAL_TABLET | Freq: Every day | ORAL | Status: DC
  Administered 2017-08-13: 21:00:00 80 mg via ORAL
  Filled 2017-08-13: qty 1

## 2017-08-13 MED ORDER — FENTANYL CITRATE (PF) 100 MCG/2ML IJ SOLN
INTRAMUSCULAR | Status: DC | PRN
  Administered 2017-08-13: 50 ug via INTRAVENOUS
  Administered 2017-08-13: 25 ug via INTRAVENOUS

## 2017-08-13 MED ORDER — NITROGLYCERIN 0.4 MG SL SUBL: 0.4000 mg | SUBLINGUAL_TABLET | SUBLINGUAL | Status: DC | PRN

## 2017-08-13 MED ORDER — HEPARIN SODIUM (PORCINE) 1000 UNIT/ML IJ SOLN
INTRAMUSCULAR | Status: DC | PRN
Start: 2017-08-13 — End: 2017-08-13
  Administered 2017-08-13: 6500 [IU] via INTRAVENOUS

## 2017-08-13 MED ORDER — NITROGLYCERIN 1 MG/10 ML FOR IR/CATH LAB
INTRA_ARTERIAL | Status: DC | PRN
  Administered 2017-08-13: 200 ug via INTRACORONARY

## 2017-08-13 MED ORDER — ADENOSINE (DIAGNOSTIC) 140MCG/KG/MIN
INTRAVENOUS | Status: DC | PRN
  Administered 2017-08-13: 140 ug/kg/min via INTRAVENOUS

## 2017-08-13 MED ORDER — LIDOCAINE HCL (PF) 1 % IJ SOLN
INTRAMUSCULAR | Status: DC | PRN
  Administered 2017-08-13: 20 mL via INTRADERMAL

## 2017-08-13 MED ORDER — IOPAMIDOL (ISOVUE-370) INJECTION 76%
INTRAVENOUS | Status: DC | PRN
  Administered 2017-08-13: 100 mL via INTRA_ARTERIAL

## 2017-08-13 MED ORDER — AZELAIC ACID 15 % EX GEL: 1.0000 "application " | Freq: Two times a day (BID) | CUTANEOUS | Status: DC

## 2017-08-13 MED ORDER — VITAMIN D 1000 UNITS PO TABS
1000.0000 [IU] | ORAL_TABLET | Freq: Every day | ORAL | Status: DC
  Administered 2017-08-14: 09:00:00 1000 [IU] via ORAL
  Filled 2017-08-13: qty 1

## 2017-08-13 MED ORDER — HEPARIN (PORCINE) IN NACL 2-0.9 UNIT/ML-% IJ SOLN
INTRAMUSCULAR | Status: DC | PRN
  Administered 2017-08-13: 10:00:00 via INTRA_ARTERIAL

## 2017-08-13 MED ORDER — METOPROLOL SUCCINATE ER 50 MG PO TB24
50.0000 mg | ORAL_TABLET | Freq: Every day | ORAL | Status: DC
  Administered 2017-08-14: 09:00:00 50 mg via ORAL
  Filled 2017-08-13: qty 1

## 2017-08-13 MED ORDER — ADENOSINE 12 MG/4ML IV SOLN
INTRAVENOUS | Status: AC
  Filled 2017-08-13: qty 16

## 2017-08-13 MED ORDER — SODIUM CHLORIDE 0.9 % IV SOLN: 250.0000 mL | INTRAVENOUS | Status: DC | PRN

## 2017-08-13 MED ORDER — LIDOCAINE HCL (PF) 1 % IJ SOLN
INTRAMUSCULAR | Status: AC
  Filled 2017-08-13: qty 30

## 2017-08-13 MED ORDER — MIDAZOLAM HCL 2 MG/2ML IJ SOLN
INTRAMUSCULAR | Status: AC
  Filled 2017-08-13: qty 2

## 2017-08-13 MED ORDER — HEPARIN SODIUM (PORCINE) 1000 UNIT/ML IJ SOLN
INTRAMUSCULAR | Status: AC
  Filled 2017-08-13: qty 1

## 2017-08-13 MED ORDER — PANTOPRAZOLE SODIUM 40 MG PO TBEC
40.0000 mg | DELAYED_RELEASE_TABLET | Freq: Every day | ORAL | Status: DC
  Administered 2017-08-14: 40 mg via ORAL
  Filled 2017-08-13: qty 1

## 2017-08-13 MED ORDER — ACETAMINOPHEN 325 MG PO TABS
650.0000 mg | ORAL_TABLET | ORAL | Status: DC | PRN
  Administered 2017-08-13: 21:00:00 650 mg via ORAL
  Filled 2017-08-13: qty 2

## 2017-08-13 MED ORDER — HEPARIN SODIUM (PORCINE) 5000 UNIT/ML IJ SOLN
5000.0000 [IU] | Freq: Three times a day (TID) | INTRAMUSCULAR | Status: DC
  Filled 2017-08-13: qty 1

## 2017-08-13 MED ORDER — HEPARIN (PORCINE) IN NACL 2-0.9 UNIT/ML-% IJ SOLN
INTRAMUSCULAR | Status: AC
  Filled 2017-08-13: qty 1000

## 2017-08-13 MED ORDER — FENTANYL CITRATE (PF) 100 MCG/2ML IJ SOLN
INTRAMUSCULAR | Status: AC
  Filled 2017-08-13: qty 2

## 2017-08-13 MED ORDER — ISOSORBIDE MONONITRATE ER 60 MG PO TB24
60.0000 mg | ORAL_TABLET | Freq: Every day | ORAL | Status: DC
  Administered 2017-08-14: 60 mg via ORAL
  Filled 2017-08-13: qty 1

## 2017-08-13 MED ORDER — CLOPIDOGREL BISULFATE 75 MG PO TABS: 75.0000 mg | ORAL_TABLET | ORAL | Status: DC

## 2017-08-13 MED ORDER — TOBRAMYCIN-DEXAMETHASONE 0.3-0.1 % OP OINT
1.0000 "application " | TOPICAL_OINTMENT | OPHTHALMIC | Status: DC
  Filled 2017-08-13: qty 3.5

## 2017-08-13 SURGICAL SUPPLY — 20 items
CATH INFINITI 5FR JL4 (CATHETERS) ×1 IMPLANT
CATH INFINITI JR4 5F (CATHETERS) ×1 IMPLANT
CATH VISTA GUIDE 6FR XBLAD3.0 (CATHETERS) ×1 IMPLANT
COVER PRB 48X5XTLSCP FOLD TPE (BAG) IMPLANT
COVER PROBE 5X48 (BAG) ×2
DEVICE RAD COMP TR BAND LRG (VASCULAR PRODUCTS) ×1 IMPLANT
DEVICE WIRE ANGIOSEAL 6FR (Vascular Products) ×1 IMPLANT
GLIDESHEATH SLEND A-KIT 6F 22G (SHEATH) ×1 IMPLANT
GUIDEWIRE INQWIRE 1.5J.035X260 (WIRE) IMPLANT
GUIDEWIRE PRESSURE COMET II (WIRE) ×1 IMPLANT
INQWIRE 1.5J .035X260CM (WIRE) ×2
KIT ESSENTIALS PG (KITS) ×1 IMPLANT
KIT HEART LEFT (KITS) ×2 IMPLANT
PACK CARDIAC CATHETERIZATION (CUSTOM PROCEDURE TRAY) ×2 IMPLANT
SHEATH PINNACLE 5F 10CM (SHEATH) ×1 IMPLANT
SHEATH PINNACLE 6F 10CM (SHEATH) ×1 IMPLANT
TRANSDUCER W/STOPCOCK (MISCELLANEOUS) ×2 IMPLANT
TUBING CIL FLEX 10 FLL-RA (TUBING) ×2 IMPLANT
WIRE EMERALD 3MM-J .035X150CM (WIRE) ×1 IMPLANT
WIRE HI TORQ VERSACORE-J 145CM (WIRE) ×1 IMPLANT

## 2017-08-13 NOTE — Progress Notes (Signed)
Client up to bathroom and client's husband came to me and states client c/o not feeling good; client passed out in bathroom; color pale and client unresponsive when I entered the bathroom and with tactile stimuli aroused and client to stretcher with 2 assist

## 2017-08-13 NOTE — Progress Notes (Signed)
Dr Tamala Julian notified of client with syncope and will admit per Dr Tamala Julian

## 2017-08-13 NOTE — Progress Notes (Signed)
Client c/o nausea and right leg hurting and Dr Tamala Julian notified

## 2017-08-13 NOTE — Discharge Instructions (Signed)
Femoral Site Care °Refer to this sheet in the next few weeks. These instructions provide you with information about caring for yourself after your procedure. Your health care provider may also give you more specific instructions. Your treatment has been planned according to current medical practices, but problems sometimes occur. Call your health care provider if you have any problems or questions after your procedure. °What can I expect after the procedure? °After your procedure, it is typical to have the following: °· Bruising at the site that usually fades within 1-2 weeks. °· Blood collecting in the tissue (hematoma) that may be painful to the touch. It should usually decrease in size and tenderness within 1-2 weeks. ° °Follow these instructions at home: °· Take medicines only as directed by your health care provider. °· You may shower 24-48 hours after the procedure or as directed by your health care provider. Remove the bandage (dressing) and gently wash the site with plain soap and water. Pat the area dry with a clean towel. Do not rub the site, because this may cause bleeding. °· Do not take baths, swim, or use a hot tub until your health care provider approves. °· Check your insertion site every day for redness, swelling, or drainage. °· Do not apply powder or lotion to the site. °· Limit use of stairs to twice a day for the first 2-3 days or as directed by your health care provider. °· Do not squat for the first 2-3 days or as directed by your health care provider. °· Do not lift over 10 lb (4.5 kg) for 5 days after your procedure or as directed by your health care provider. °· Ask your health care provider when it is okay to: °? Return to work or school. °? Resume usual physical activities or sports. °? Resume sexual activity. °· Do not drive home if you are discharged the same day as the procedure. Have someone else drive you. °· You may drive 24 hours after the procedure unless otherwise instructed by  your health care provider. °· Do not operate machinery or power tools for 24 hours after the procedure or as directed by your health care provider. °· If your procedure was done as an outpatient procedure, which means that you went home the same day as your procedure, a responsible adult should be with you for the first 24 hours after you arrive home. °· Keep all follow-up visits as directed by your health care provider. This is important. °Contact a health care provider if: °· You have a fever. °· You have chills. °· You have increased bleeding from the site. Hold pressure on the site. °Get help right away if: °· You have unusual pain at the site. °· You have redness, warmth, or swelling at the site. °· You have drainage (other than a small amount of blood on the dressing) from the site. °· The site is bleeding, and the bleeding does not stop after 30 minutes of holding steady pressure on the site. °· Your leg or foot becomes pale, cool, tingly, or numb. °This information is not intended to replace advice given to you by your health care provider. Make sure you discuss any questions you have with your health care provider. °Document Released: 01/29/2014 Document Revised: 11/03/2015 Document Reviewed: 12/15/2013 °Elsevier Interactive Patient Education © 2018 Elsevier Inc. ° °Radial Site Care °Refer to this sheet in the next few weeks. These instructions provide you with information about caring for yourself after your procedure. Your health   care provider may also give you more specific instructions. Your treatment has been planned according to current medical practices, but problems sometimes occur. Call your health care provider if you have any problems or questions after your procedure. °What can I expect after the procedure? °After your procedure, it is typical to have the following: °· Bruising at the radial site that usually fades within 1-2 weeks. °· Blood collecting in the tissue (hematoma) that may be  painful to the touch. It should usually decrease in size and tenderness within 1-2 weeks. ° °Follow these instructions at home: °· Take medicines only as directed by your health care provider. °· You may shower 24-48 hours after the procedure or as directed by your health care provider. Remove the bandage (dressing) and gently wash the site with plain soap and water. Pat the area dry with a clean towel. Do not rub the site, because this may cause bleeding. °· Do not take baths, swim, or use a hot tub until your health care provider approves. °· Check your insertion site every day for redness, swelling, or drainage. °· Do not apply powder or lotion to the site. °· Do not flex or bend the affected arm for 24 hours or as directed by your health care provider. °· Do not push or pull heavy objects with the affected arm for 24 hours or as directed by your health care provider. °· Do not lift over 10 lb (4.5 kg) for 5 days after your procedure or as directed by your health care provider. °· Ask your health care provider when it is okay to: °? Return to work or school. °? Resume usual physical activities or sports. °? Resume sexual activity. °· Do not drive home if you are discharged the same day as the procedure. Have someone else drive you. °· You may drive 24 hours after the procedure unless otherwise instructed by your health care provider. °· Do not operate machinery or power tools for 24 hours after the procedure. °· If your procedure was done as an outpatient procedure, which means that you went home the same day as your procedure, a responsible adult should be with you for the first 24 hours after you arrive home. °· Keep all follow-up visits as directed by your health care provider. This is important. °Contact a health care provider if: °· You have a fever. °· You have chills. °· You have increased bleeding from the radial site. Hold pressure on the site. °Get help right away if: °· You have unusual pain at the  radial site. °· You have redness, warmth, or swelling at the radial site. °· You have drainage (other than a small amount of blood on the dressing) from the radial site. °· The radial site is bleeding, and the bleeding does not stop after 30 minutes of holding steady pressure on the site. °· Your arm or hand becomes pale, cool, tingly, or numb. °This information is not intended to replace advice given to you by your health care provider. Make sure you discuss any questions you have with your health care provider. °Document Released: 06/30/2010 Document Revised: 11/03/2015 Document Reviewed: 12/14/2013 °Elsevier Interactive Patient Education © 2018 Elsevier Inc. ° ° °

## 2017-08-13 NOTE — Interval H&P Note (Signed)
Cath Lab Visit (complete for each Cath Lab visit)  Clinical Evaluation Leading to the Procedure:   ACS: No.  Non-ACS:    Anginal Classification: CCS III  Anti-ischemic medical therapy: Maximal Therapy (2 or more classes of medications)  Non-Invasive Test Results: No non-invasive testing performed  Prior CABG: No previous CABG      History and Physical Interval Note:  08/13/2017 9:24 AM  Sherry Barnett  has presented today for surgery, with the diagnosis of angina  The various methods of treatment have been discussed with the patient and family. After consideration of risks, benefits and other options for treatment, the patient has consented to  Procedure(s): LEFT HEART CATH AND CORONARY ANGIOGRAPHY (N/A) as a surgical intervention .  The patient's history has been reviewed, patient examined, no change in status, stable for surgery.  I have reviewed the patient's chart and labs.  Questions were answered to the patient's satisfaction.     [name]

## 2017-08-13 NOTE — Plan of Care (Signed)
  Progressing Education: Knowledge of General Education information will improve 08/13/2017 2339 - Progressing by Randal Buba, RN Cardiovascular: Ability to achieve and maintain adequate cardiovascular perfusion will improve 08/13/2017 2339 - Progressing by Randal Buba, RN Vascular access site(s) Level 0-1 will be maintained 08/13/2017 2339 - Progressing by Randal Buba, RN

## 2017-08-13 NOTE — Progress Notes (Signed)
Client transferred to 6-C-2 via stretcher and cardiac monitor and monitor showing sinus brady

## 2017-08-14 ENCOUNTER — Encounter (HOSPITAL_COMMUNITY): Payer: Self-pay | Admitting: Interventional Cardiology

## 2017-08-14 DIAGNOSIS — I25119 Atherosclerotic heart disease of native coronary artery with unspecified angina pectoris: Secondary | ICD-10-CM | POA: Diagnosis not present

## 2017-08-14 DIAGNOSIS — M8580 Other specified disorders of bone density and structure, unspecified site: Secondary | ICD-10-CM | POA: Diagnosis not present

## 2017-08-14 DIAGNOSIS — E7800 Pure hypercholesterolemia, unspecified: Secondary | ICD-10-CM | POA: Diagnosis not present

## 2017-08-14 DIAGNOSIS — M479 Spondylosis, unspecified: Secondary | ICD-10-CM | POA: Diagnosis not present

## 2017-08-14 DIAGNOSIS — I2510 Atherosclerotic heart disease of native coronary artery without angina pectoris: Secondary | ICD-10-CM

## 2017-08-14 DIAGNOSIS — I2584 Coronary atherosclerosis due to calcified coronary lesion: Secondary | ICD-10-CM | POA: Diagnosis not present

## 2017-08-14 DIAGNOSIS — I25118 Atherosclerotic heart disease of native coronary artery with other forms of angina pectoris: Secondary | ICD-10-CM | POA: Diagnosis not present

## 2017-08-14 DIAGNOSIS — I209 Angina pectoris, unspecified: Secondary | ICD-10-CM

## 2017-08-14 DIAGNOSIS — E785 Hyperlipidemia, unspecified: Secondary | ICD-10-CM

## 2017-08-14 DIAGNOSIS — Z955 Presence of coronary angioplasty implant and graft: Secondary | ICD-10-CM | POA: Diagnosis not present

## 2017-08-14 LAB — BASIC METABOLIC PANEL
Anion gap: 8 (ref 5–15)
BUN: 12 mg/dL (ref 6–20)
CALCIUM: 8.2 mg/dL — AB (ref 8.9–10.3)
CO2: 24 mmol/L (ref 22–32)
CREATININE: 0.95 mg/dL (ref 0.44–1.00)
Chloride: 105 mmol/L (ref 101–111)
GFR calc Af Amer: >60 mL/min (ref >60)
GFR calc non Af Amer: 54 mL/min — ABNORMAL LOW (ref >60)
GLUCOSE: 109 mg/dL — AB (ref 65–99)
Potassium: 4.1 mmol/L (ref 3.5–5.1)
Sodium: 137 mmol/L (ref 135–145)

## 2017-08-14 LAB — CBC
HEMATOCRIT: 32.8 % — AB (ref 36.0–46.0)
Hemoglobin: 10.6 g/dL — ABNORMAL LOW (ref 12.0–15.0)
MCH: 30.3 pg (ref 26.0–34.0)
MCHC: 32.3 g/dL (ref 30.0–36.0)
MCV: 93.7 fL (ref 78.0–100.0)
Platelets: 177 10*3/uL (ref 150–400)
RBC: 3.5 MIL/uL — ABNORMAL LOW (ref 3.87–5.11)
RDW: 14.5 % (ref 11.5–15.5)
WBC: 9.8 10*3/uL (ref 4.0–10.5)

## 2017-08-14 NOTE — Progress Notes (Signed)
Patient ambulated to end of hall, [employer]	[place] and back. Slow steady gait, no shortness of breath, complained of throat pressure, resolved quickly when resting in bed. Notified Reino Bellis NP, patient given Imdur and Toprol as ordered.

## 2017-08-14 NOTE — Plan of Care (Signed)
  Progressing Education: Knowledge of General Education information will improve 08/14/2017 0017 - Progressing by Randal Buba, RN 08/13/2017 2339 - Progressing by Randal Buba, RN Cardiovascular: Ability to achieve and maintain adequate cardiovascular perfusion will improve 08/14/2017 0017 - Progressing by Randal Buba, RN 08/13/2017 2339 - Progressing by Randal Buba, RN Vascular access site(s) Level 0-1 will be maintained 08/14/2017 0017 - Progressing by Randal Buba, RN 08/13/2017 2339 - Progressing by Randal Buba, RN Pain Managment: General experience of comfort will improve 08/14/2017 0017 - Progressing by Randal Buba, RN

## 2017-08-14 NOTE — Discharge Summary (Addendum)
Discharge Summary    Patient ID: Sherry Barnett,  MRN: [id number], DOB/AGE: 02-13-1933 84 y.o.  Admit date: 08/13/2017 Discharge date: 08/14/2017  Primary Care Provider: Lajean Barnett Primary Cardiologist: Dr. Tamala Barnett  Discharge Diagnoses    Principal Problem:   Angina pectoris [facility]) Active Problems:   Hyperlipidemia LDL goal <70   Paroxysmal atrial fibrillation (HCC)   Syncope   Coronary artery disease   Allergies Allergies  Allergen Reactions  . Sulfa Antibiotics Other (See Comments)    HEMATURIA  . Novocain [Procaine] Palpitations  . Penicillins Rash    Has patient had a PCN reaction causing immediate rash, facial/tongue/throat swelling, SOB or lightheadedness with hypotension: Yes Has patient had a PCN reaction causing severe rash involving mucus membranes or skin necrosis: Unknown Has patient had a PCN reaction that required hospitalization: No Has patient had a PCN reaction occurring within the last 10 years: No If all of the above answers are "NO", then may proceed with Cephalosporin use.     Diagnostic Studies/Procedures    Cath: 08/13/17  Conclusion     Ost 3rd Mrg lesion is 85% stenosed.    Left main without evidence of significant obstruction  [name] contains mid and distal vessel significant tortuosity before wrapping around the left ventricular apex.  The previously placed proximal to mid [name] stent is widely patent.  The first dominant diagonal contains eccentric ostial to proximal 60-70% narrowing.  The [name] beyond the diagonal is calcified and contains eccentric 50-70% narrowing.  Circumflex contains proximal and mid luminal irregularities.  Distally before the origin of the dominant obtuse marginal branch there is 50% narrowing.  The continuation of the circumflex beyond the second obtuse marginal contains ostial 90% narrowing.  This is unchanged from the prior angiogram.  The right coronary contains 30-40% ostial narrowing.  No catheter dampening is  noted.  The vessel contains tortuosity.  The mid vessel after the acute marginal branch contains 50-70% narrowing.  This is unchanged from prior.  The PDA contains diffuse narrowing from proximal to mid segment 50-70%.  FFR of the mid [name] lesion was 0.87.  No interventional therapy was performed on this occasion.  Generally speaking, the coronary is a small in caliber representing diffuse atherosclerosis.  RECOMMENDATIONS:   Medical therapy.  Perhaps further up titration of isosorbide therapy.  Risk factor modification.   _____________   History of Present Illness     84 y.o. female with PMH of exertional chest and jaw discomfortwhich upon evaluation with coronary angiography in October 2018 identified high-grade obstruction mid [name] treated with a drug-eluting stent, prior history of paroxysmal/episodic atrial fibrillation but no documenting data availableand on no anticoagulation therapy.   She underwent PCI of the proximal [name] in October 2018.  Over the last 4-6 weeks prior to office visit she had had recurring episodes of exertion related and rest discomfort.  The episodes occurring at rest or waves of tightness in the chest that lasts 30 seconds to a minute.  They had a different quality than exertion related discomfort.  She has had difficulty completing some rehabilitation sessions because of chest discomfort. Given her recurrence of symptoms she was sent for outpatient cath and Imdur was increased to 60mg  daily.    Hospital Course     Underwent cardiac cath noted above with FFR of the mLAD lesion beng 0.87, therefore no interventional therapy was done. Plan was to discharge post cath yesterday, but patient got up to the bathroom and had a  syncopal episode while in short stay. As a result she was admitted overnight for observation. No complications noted overnight. Ambulated in the hallway without difficulty. No recurrent chest pain.   General: Well developed, well nourished,  female appearing in no acute distress. Head: Normocephalic, atraumatic.  Neck: Supple, no JVD. Lungs:  Resp regular and unlabored, CTA. Heart: RRR, S1, S2, no S3, S4, or murmur; no rub. Abdomen: Soft, non-tender, non-distended with normoactive bowel sounds. No hepatomegaly. No rebound/guarding. No obvious abdominal masses. Extremities: No clubbing, cyanosis, edema. Distal pedal pulses are 2+ bilaterally. R radial/femoral cath site stable with mild bruising, but no hematoma. Neuro: Alert and oriented X 3. Moves all extremities spontaneously. Psych: Normal affect.  Sherry Barnett was seen by Dr. Claiborne Billings and determined stable for discharge home. Follow up in the office has been arranged. Medications are listed below.   _____________  Discharge Vitals Blood pressure (!) 112/38, pulse (!) 57, temperature 97.6 F (36.4 C), temperature source Oral, resp. rate 12, height 5' 2.5" (1.588 m), weight 136 lb 11 oz (62 kg), SpO2 98 %.  Filed Weights   08/13/17 0811 08/14/17 0247  Weight: 137 lb (62.1 kg) 136 lb 11 oz (62 kg)    Labs & Radiologic Studies    CBC Recent Labs    08/13/17 1922 08/14/17 0244  WBC 12.1* 9.8  HGB 12.0 10.6*  HCT 37.8 32.8*  MCV 94.0 93.7  PLT 168 371   Basic Metabolic Panel Recent Labs    08/13/17 1922 08/14/17 0244  NA  --  137  K  --  4.1  CL  --  105  CO2  --  24  GLUCOSE  --  109*  BUN  --  12  CREATININE 0.89 0.95  CALCIUM  --  8.2*   Liver Function Tests No results for input(s): AST, ALT, ALKPHOS, BILITOT, PROT, ALBUMIN in the last 72 hours. No results for input(s): LIPASE, AMYLASE in the last 72 hours. Cardiac Enzymes No results for input(s): CKTOTAL, CKMB, CKMBINDEX, TROPONINI in the last 72 hours. BNP Invalid input(s): POCBNP D-Dimer No results for input(s): DDIMER in the last 72 hours. Hemoglobin A1C No results for input(s): HGBA1C in the last 72 hours. Fasting Lipid Panel No results for input(s): CHOL, HDL, LDLCALC, TRIG, CHOLHDL,  LDLDIRECT in the last 72 hours. Thyroid Function Tests No results for input(s): TSH, T4TOTAL, T3FREE, THYROIDAB in the last 72 hours.  Invalid input(s): FREET3 _____________  Dg Chest 2 View  Result Date: 07/17/2017 CLINICAL DATA:  Chest pain EXAM: CHEST  2 VIEW COMPARISON:  None. FINDINGS: The heart size and mediastinal contours are within normal limits. Both lungs are clear. The visualized skeletal structures are unremarkable. IMPRESSION: No active cardiopulmonary disease. Electronically Signed   By: Donavan Foil M.D.   On: 07/17/2017 01:52   Disposition   Pt is being discharged home today in good condition.  Follow-up Plans & Appointments    Follow-up Information    Liliane Shi, PA-C Follow up on 08/27/2017.   Specialties:  Cardiology, Physician Assistant Why:  at 10:15am for your follow up appt.  Contact information: [address] [place] [address] [phone]          Discharge Instructions    Call MD for:  redness, tenderness, or signs of infection (pain, swelling, redness, odor or green/yellow discharge around incision site)   Complete by:  As directed    Diet - low sodium heart healthy   Complete by:  As directed    Discharge instructions   Complete by:  As directed    Groin Site Care Refer to this sheet in the next few weeks. These instructions provide you with information on caring for yourself after your procedure. Your caregiver may also give you more specific instructions. Your treatment has been planned according to current medical practices, but problems sometimes occur. Call your caregiver if you have any problems or questions after your procedure. HOME CARE INSTRUCTIONS You may shower 24 hours after the procedure. Remove the bandage (dressing) and gently wash the site with plain soap and water. Gently pat the site dry.  Do not apply powder or lotion to the site.  Do not sit in a bathtub, swimming pool, or whirlpool for 5 to 7 days.    No bending, squatting, or lifting anything over 10 pounds (4.5 kg) as directed by your caregiver.  Inspect the site at least twice daily.  Do not drive home if you are discharged the same day of the procedure. Have someone else drive you.  You may drive 24 hours after the procedure unless otherwise instructed by your caregiver.  What to expect: Any bruising will usually fade within 1 to 2 weeks.  Blood that collects in the tissue (hematoma) may be painful to the touch. It should usually decrease in size and tenderness within 1 to 2 weeks.  SEEK IMMEDIATE MEDICAL CARE IF: You have unusual pain at the groin site or down the affected leg.  You have redness, warmth, swelling, or pain at the groin site.  You have drainage (other than a small amount of blood on the dressing).  You have chills.  You have a fever or persistent symptoms for more than 72 hours.  You have a fever and your symptoms suddenly get worse.  Your leg becomes pale, cool, tingly, or numb.  You have heavy bleeding from the site. Hold pressure on the site. .   Increase activity slowly   Complete by:  As directed       Discharge Medications     Medication List    TAKE these medications   alendronate 70 MG tablet Commonly known as:  FOSAMAX Take 70 mg by mouth every Saturday. Take with a full glass of water on an empty stomach.   artificial tears ointment Place 1 drop into both eyes 4 (four) times daily as needed (dry eyes). Refresh Tears for dry eyes.   aspirin 81 MG EC tablet Take 81 mg by mouth daily with breakfast. Swallow whole.   atorvastatin 80 MG tablet Commonly known as:  LIPITOR TAKE 1 TABLET BY MOUTH EVERY DAY AT 6:00 PM   calcium carbonate 750 MG chewable tablet Commonly known as:  TUMS EX Chew 1-2 tablets by mouth 2 (two) times daily as needed (for hearburn/indigestion.).   cholecalciferol 1000 units tablet Commonly known as:  VITAMIN D Take 1,000 Units by mouth daily.   clopidogrel 75 MG  tablet Commonly known as:  PLAVIX Take 1 tablet (75 mg total) by mouth daily.   FINACEA 15 % cream Generic drug:  Azelaic Acid Apply 1 application topically 2 (two) times daily. After skin is thoroughly washed and patted dry, gently but thoroughly massage a thin film of azelaic acid cream into the affected area twice daily, in the morning and evening.   isosorbide mononitrate 60 MG 24 hr tablet Commonly known as:  IMDUR Take 1 tablet (60 mg total) by mouth daily.   metoprolol succinate 50 MG  24 hr tablet Commonly known as:  TOPROL-XL Take 1 tablet (50 mg total) by mouth daily. Take with or immediately following a meal.   nitroGLYCERIN 0.4 MG SL tablet Commonly known as:  NITROSTAT Place 1 tablet (0.4 mg total) under the tongue every 5 (five) minutes as needed for chest pain.   pantoprazole 40 MG tablet Commonly known as:  PROTONIX Take 1 tablet (40 mg total) by mouth daily.   tobramycin-dexamethasone ophthalmic ointment Commonly known as:  TOBRADEX Place 1 application into both eyes See admin instructions. Tuesday AND Saturday patient does not use. Will apply at night on all other nights.         Outstanding Labs/Studies   N/a   Duration of Discharge Encounter   Greater than 30 minutes including physician time.  Signed, Reino Bellis NP-C 08/14/2017, 10:38 AM    Patient seen and examined. Agree with assessment and plan. Feels well; R radial and R groin cath sites stable.   Troy Sine, MD, [facility] 08/14/2017 10:38 AM

## 2017-08-16 ENCOUNTER — Telehealth (HOSPITAL_COMMUNITY): Payer: Self-pay | Admitting: Cardiac Rehabilitation

## 2017-08-16 NOTE — Telephone Encounter (Signed)
pc to pt to discuss clearance to return to cardiac rehab. Pt advised to wait until after hospital f/u appt to resume cardiac rehab.  Understanding verbalized.   Andi Hence, RN, BSN Cardiac Pulmonary Rehab 08/16/17  4:58 PM

## 2017-08-27 ENCOUNTER — Encounter: Payer: Self-pay | Admitting: Physician Assistant

## 2017-08-27 ENCOUNTER — Encounter (INDEPENDENT_AMBULATORY_CARE_PROVIDER_SITE_OTHER): Payer: Self-pay

## 2017-08-27 ENCOUNTER — Ambulatory Visit (INDEPENDENT_AMBULATORY_CARE_PROVIDER_SITE_OTHER): Payer: Medicare Other | Admitting: Physician Assistant

## 2017-08-27 VITALS — BP 126/62 | HR 56 | Wt 140.4 lb

## 2017-08-27 DIAGNOSIS — I209 Angina pectoris, unspecified: Secondary | ICD-10-CM

## 2017-08-27 DIAGNOSIS — I480 Paroxysmal atrial fibrillation: Secondary | ICD-10-CM | POA: Diagnosis not present

## 2017-08-27 DIAGNOSIS — R55 Syncope and collapse: Secondary | ICD-10-CM | POA: Diagnosis not present

## 2017-08-27 DIAGNOSIS — E785 Hyperlipidemia, unspecified: Secondary | ICD-10-CM | POA: Diagnosis not present

## 2017-08-27 DIAGNOSIS — I25119 Atherosclerotic heart disease of native coronary artery with unspecified angina pectoris: Secondary | ICD-10-CM

## 2017-08-27 NOTE — Patient Instructions (Addendum)
Medication Instructions:  No changes.  See your medication list.   Labwork: None   Testing/Procedures: None   Follow-Up: Sinclair Grooms, MD on  December 16, 2017 @ 10 AM   Any Other Special Instructions Will Be Listed Below (If Applicable). I will send my note to cardiac rehabilitation so that you can get restarted there.  You can look into the [facility] device by [employer]. This device is purchased by you and it connects to an application you download to your smart phone.  It can detect abnormal heart rhythms and alert you to contact your doctor for further evaluation. The web site is:  https://www.alivecor.com   If you need a refill on your cardiac medications before your next appointment, please call your pharmacy.

## 2017-08-27 NOTE — Progress Notes (Signed)
Cardiology Office Note:    Date:  08/27/2017   ID:  Sherry Barnett, DOB August 09, 1932, MRN [id number]  PCP:  Lajean Manes, MD  Cardiologist:  Sinclair Grooms, MD   Referring MD: Lajean Manes, MD   Chief Complaint  Patient presents with  . Hospitalization Follow-up    Status post cardiac catheterization    History of Present Illness:    Sherry Barnett is a 84 y.o. female with coronary artery disease status post prior PCI of the proximal [name] in October 2018, paroxysmal atrial fibrillation, hyperlipidemia, chronic kidney disease.  She was last seen by Dr. Tamala Julian 08/02/17 with complaints of chest and jaw discomfort consistent with angina pectoris.  Cardiac catheterization 08/13/17 demonstrated a patent [name] stent.  There was 50-70% mid [name] stenosis which was evaluated by FFR.  This was not hemodynamically significant (0.87).  Generally speaking, she had small coronary anatomy with diffuse atherosclerosis.  There was no significant change since prior angiogram.  Medical therapy was recommended.  Her isosorbide was increased.  She was kept overnight for observation after getting up to go the bathroom and having a syncopal episode.  [name] returns for follow-up post catheterization.  She is here today with her husband.  Since her medications have been adjusted, she has felt much better.  She has had only one episode of exertional chest discomfort that was fairly brief and went away promptly with rest.  She continues to have episodes of palpitations.  She denies shortness of breath, syncope, PND or edema.  She has not had to use any nitroglycerin.  Prior CV studies:   The following studies were reviewed today:  Cardiac catheterization 08/13/17 LM ok [name] proximal stent patent, mid 50-70; D2 ostial 60-70 LCx ostial 30, distal 50; OM3 ostial 85 (unchanged) RCA ostial 30-40, mid 50-70; PDA proximal-mid 50-70 EF 55-65 FFR of mid [name] 0.87 - not hemodynamically significant Medical therapy  Cardiac  catheterization 03/14/17  LM normal [name] proximal 60, mid 90-95 RCA ostial 50 LCx 50 before OM branch EF 50-55 PCI: 2.25 x 23 mm Sierra DES to the proximal-mid [name]  Event monitor 03/08/17 Study is limited by interrupted data collection.  Monitor was not worn for the entire.  (Less than 50%) No arrhythmias were noted. Basic underlying rhythm is normal sinus rhythm.  Nuclear stress test 02/27/17 EF 68, Normal perfusion, low risk  Holter monitor 01/21/14 NSR, PACs, brief supraventricular runs  Past Medical History:  Diagnosis Date  . A-fib ([place])   . Anemia   . Arthritis    "a little in my hands" (08/13/2017)  . Blepharitis    chronic  . CAD (coronary artery disease), native coronary artery    10/18 PCI/DES to p/mLAD, normal EF  . Cardiovascular risk factor    23%  . Degenerative joint disease (DJD) of lumbar spine   . Diverticulosis   . Endometriosis   . Fibroids   . GERD (gastroesophageal reflux disease)   . Hematuria, microscopic [id number]  . Hemorrhoids   . Hypercholesterolemia    10 years  . Osteopenia 03/2016   start alendrodate  . PONV (postoperative nausea and vomiting)   . Renal disease    stage2/stage 3   Surgical Hx: The patient  has a past surgical history that includes Appendectomy; LEFT HEART CATH AND CORONARY ANGIOGRAPHY (N/A, 03/14/2017); CORONARY STENT INTERVENTION (N/A, 03/14/2017); Ovarian cyst surgery (Bilateral); Coronary angioplasty with stent; Cardiac catheterization (08/13/2017); Tonsillectomy; Cataract extraction w/ intraocular lens  implant, bilateral (Bilateral,  2001 -  11/2016); Salpingoophorectomy (Bilateral); Dilation and curettage of uterus; and LEFT HEART CATH AND CORONARY ANGIOGRAPHY (N/A, 08/13/2017).   Current Medications: Current Meds  Medication Sig  . alendronate (FOSAMAX) 70 MG tablet Take 70 mg by mouth every Saturday. Take with a full glass of water on an empty stomach.   . Artificial Tear Ointment (ARTIFICIAL TEARS) ointment Place 1 drop into  both eyes 4 (four) times daily as needed (dry eyes). Refresh Tears for dry eyes.  Marland Kitchen aspirin 81 MG EC tablet Take 81 mg by mouth daily with breakfast. Swallow whole.  Marland Kitchen atorvastatin (LIPITOR) 80 MG tablet TAKE 1 TABLET BY MOUTH EVERY DAY AT 6:00 PM  . Azelaic Acid (FINACEA) 15 % cream Apply 1 application topically 2 (two) times daily. After skin is thoroughly washed and patted dry, gently but thoroughly massage a thin film of azelaic acid cream into the affected area twice daily, in the morning and evening.   . calcium carbonate (TUMS EX) 750 MG chewable tablet Chew 1-2 tablets by mouth 2 (two) times daily as needed (for hearburn/indigestion.).   Marland Kitchen cholecalciferol (VITAMIN D) 1000 units tablet Take 1,000 Units by mouth daily.  . clopidogrel (PLAVIX) 75 MG tablet Take 1 tablet (75 mg total) by mouth daily.  . isosorbide mononitrate (IMDUR) 60 MG 24 hr tablet Take 1 tablet (60 mg total) by mouth daily.  . metoprolol succinate (TOPROL-XL) 50 MG 24 hr tablet Take 1 tablet (50 mg total) by mouth daily. Take with or immediately following a meal.  . nitroGLYCERIN (NITROSTAT) 0.4 MG SL tablet Place 1 tablet (0.4 mg total) under the tongue every 5 (five) minutes as needed for chest pain.  . pantoprazole (PROTONIX) 40 MG tablet Take 1 tablet (40 mg total) by mouth daily.  Marland Kitchen tobramycin-dexamethasone (TOBRADEX) ophthalmic ointment Place 1 application into both eyes See admin instructions. Tuesday AND Saturday patient does not use. Will apply at night on all other nights.     Allergies:   Sulfa antibiotics; Novocain [procaine]; and Penicillins   Social History   Tobacco Use  . Smoking status: Former Smoker    Packs/day: 0.10    Years: 5.00    Pack years: 0.50    Types: Cigarettes    Last attempt to quit: 02/06/1969    Years since quitting: 48.5  . Smokeless tobacco: Never Used  Substance Use Topics  . Alcohol use: No  . Drug use: No     Family Hx: The patient's family history includes Alzheimer's  disease in her father; Heart attack in her mother; Hypertension in her father; Other (age of onset: 31) in her sister; Stroke in her father.  ROS:   Please see the history of present illness.    Review of Systems  Cardiovascular: Positive for chest pain.  Respiratory: Positive for shortness of breath.   Hematologic/Lymphatic: Bruises/bleeds easily.   All other systems reviewed and are negative.   EKGs/Labs/Other Test Reviewed:    EKG:  EKG is  ordered today.  The ekg ordered today demonstrates sinus bradycardia, heart rate 56, normal axis, septal Q waves, QTC 409  Recent Labs: 05/23/2017: ALT 34 08/14/2017: BUN 12; Creatinine, Ser 0.95; Hemoglobin 10.6; Platelets 177; Potassium 4.1; Sodium 137   Recent Lipid Panel Lab Results  Component Value Date/Time   CHOL 117 05/06/2017 08:06 AM   TRIG 74 05/06/2017 08:06 AM   HDL 57 05/06/2017 08:06 AM   CHOLHDL 2.1 05/06/2017 08:06 AM   LDLCALC 45 05/06/2017 08:06 AM  Physical Exam:    VS:  BP 126/62   Pulse (!) 56   Wt 140 lb 6.4 oz (63.7 kg)   SpO2 97%   BMI 25.27 kg/m     Wt Readings from Last 3 Encounters:  08/27/17 140 lb 6.4 oz (63.7 kg)  08/14/17 136 lb 11 oz (62 kg)  08/02/17 140 lb 9.6 oz (63.8 kg)     Physical Exam  Constitutional: She is oriented to person, place, and time. She appears well-developed and well-nourished. No distress.  HENT:  Head: Normocephalic and atraumatic.  Neck: No JVD present.  Cardiovascular: Normal rate and regular rhythm.  No murmur heard. Pulmonary/Chest: Effort normal. She has no rales.  Abdominal: Soft.  Musculoskeletal: She exhibits no edema.  R groin without hematoma or bruit  Neurological: She is alert and oriented to person, place, and time.  Skin: Skin is warm and dry.    ASSESSMENT & PLAN:    #1.  Coronary artery disease with angina pectoris ([place])  Status post DES to the [name] in 10/18.  Recent cardiac catheterization 08/13/17 with patent [name] stent.  She does have moderate  nonobstructive disease elsewhere with evidence of small vessel disease.  FFR of the mid [name] demonstrated no hemodynamically significant stenosis.  Her symptoms are improved on higher dose isosorbide.  Her blood pressure limits further titration of her antianginals.  We did discuss the possibility of adding ranolazine.  However, she is quite pleased with her current stable anginal symptoms.  She would like to hold off on adding any further medications.  I agree with this approach.  I have encouraged her to get back to cardiac rehabilitation.  She will continue on aspirin, Plavix, atorvastatin, isosorbide, metoprolol succinate.  Follow-up in 3 months or sooner if needed.  #2.  Syncope, unspecified syncope type She had a syncopal episode in the hospital shortly after standing.  This was after lying supine for a prolonged period of time after her cardiac catheterization.  She likely had a vasovagal episode.  She has not had a recurrence.  Her blood pressures currently stable.  No further workup is indicated.  #3.  Paroxysmal atrial fibrillation ([place]) Records indicate she has a remote history of atrial fibrillation.  There has been no clear documentation of atrial fibrillation and she is not on anticoagulation.  She continues to have episodic palpitations.  She wore an event monitor last year.  She had difficulty wearing the device but no significant arrhythmias were identified.  I have suggested that she look into the [facility] device.  I have provided her information for this today.  #4.  Hyperlipidemia LDL goal <70 LDL optimal on most recent lab work.  Continue current Rx.     Dispo:  Return in about 3 months (around 11/27/2017) for Routine Follow Up w/ Dr. Tamala Julian.   Medication Adjustments/Labs and Tests Ordered: Current medicines are reviewed at length with the patient today.  Concerns regarding medicines are outlined above.  Tests Ordered: Orders Placed This Encounter  Procedures  . EKG 12-Lead     Medication Changes: No orders of the defined types were placed in this encounter.   Signed, Richardson Dopp, PA-C  08/27/2017 5:16 PM    [facility] [address], [place], [place]  [address] Phone: [phone]; Fax: [phone]

## 2017-08-28 ENCOUNTER — Other Ambulatory Visit (HOSPITAL_COMMUNITY): Payer: Self-pay | Admitting: *Deleted

## 2017-08-28 DIAGNOSIS — I208 Other forms of angina pectoris: Secondary | ICD-10-CM

## 2017-08-29 NOTE — Progress Notes (Deleted)
Discharge Progress Report  Patient Details  Name: Sherry Barnett MRN: [id number] Date of Birth: [birth year] Referring Provider:   Flowsheet Row CARDIAC REHAB PHASE II ORIENTATION from 04/11/2017 in [place]  Referring Provider  Daneen Schick MD       Number of Visits: 33   Reason for Discharge:  Early Exit:  medical-chest pain evaluation. Pt with good attendance and participation.  Pt interested in restarting program once cleared by Dr. Tamala Julian.    Smoking History:  Social History   Tobacco Use  Smoking Status Former Smoker  . Packs/day: 0.10  . Years: 5.00  . Pack years: 0.50  . Types: Cigarettes  . Last attempt to quit: 02/06/1969  . Years since quitting: 48.5  Smokeless Tobacco Never Used    Diagnosis:  S/P drug eluting coronary stent placement  ADL [facility]:   Initial Exercise Prescription: Initial Exercise Prescription - 04/11/17 1100    Date of Initial Exercise RX and Referring Provider          Date  04/11/17    Referring Provider  Daneen Schick MD        Recumbant Bike          Level  1.5    Watts  10    Minutes  5    METs  2.24        NuStep          Level  2    SPM  75    Minutes  10    METs  2        Track          Laps  6    Minutes  10    METs  2.03        Prescription Details          Frequency (times per week)  3    Duration  Progress to 30 minutes of continuous aerobic without signs/symptoms of physical distress        Intensity          THRR 40-80% of Max Heartrate  55-110    Ratings of Perceived Exertion  11-13    Perceived Dyspnea  0-4        Progression          Progression  Continue to progress workloads to maintain intensity without signs/symptoms of physical distress.        Resistance Training          Training Prescription  Yes    Weight  2lbs    Reps  10-15           Discharge Exercise Prescription (Final Exercise Prescription Changes): Exercise Prescription Changes -  07/29/17 0821    Response to Exercise          Blood Pressure (Admit)  112/60    Blood Pressure (Exercise)  118/54    Blood Pressure (Exit)  116/74    Heart Rate (Admit)  88 bpm    Heart Rate (Exercise)  86 bpm    Heart Rate (Exit)  66 bpm    Symptoms  none    Duration  Continue with 30 min of aerobic exercise without signs/symptoms of physical distress.    Intensity  THRR unchanged        Progression          Progression  Continue to progress workloads to maintain intensity without signs/symptoms of physical distress.    Average METs  1.6        Resistance Training          Training Prescription  Yes    Weight  2lbs    Reps  10-15    Time  10 Minutes        Recumbant Bike          Level  3    Watts  8    Minutes  10    METs  1.7        NuStep          Level  3    SPM  60    Minutes  15    METs  1.3        Track          Laps  5    Minutes  10    METs  1.8        Home Exercise Plan          Plans to continue exercise at  Home (comment)    Frequency  Add 2 additional days to program exercise sessions.    Initial Home Exercises Provided  04/29/17           Functional Capacity: 6 Minute Walk    6 Minute Walk    Row Name 04/11/17 1141 04/11/17 1208 07/23/17 1234   Phase  Initial  no documentation  Initial   Distance  733 feet  no documentation  no documentation   Walk Time  4.54 minutes  no documentation  no documentation   # of Rest Breaks  0  no documentation  no documentation   MPH  1.8  no documentation  no documentation   METS  1.1  no documentation  no documentation   VO2 Peak  3.87  no documentation  no documentation   Symptoms  Yes (comment)  no documentation  no documentation   Comments  chest pressure radiating to bilateral jaws; SOB  chest pressure radiating to bilateral jaws; SOB. Test terminated at 4:54. Pt reported 4-5/10 for chest pain/discomfort  no documentation   Resting HR  69 bpm  no documentation  no documentation   Resting  BP  112/80  no documentation  no documentation   Resting Oxygen Saturation   97 %  no documentation  no documentation   Exercise Oxygen Saturation  during 6 min walk  99 %  no documentation  no documentation   Max Ex. HR  90 bpm  no documentation  no documentation   Max Ex. BP  128/84  no documentation  no documentation   2 Minute Post BP  130/51  no documentation  no documentation       6 Minute Walk    Row Name 07/24/17 1124 07/24/17 1136   Phase  Discharge  no documentation   Distance  1000 feet  no documentation   Distance % Change  36.43 %  no documentation   Distance Feet Change  267 ft  no documentation   Walk Time  5 minutes  no documentation   # of Rest Breaks  0  no documentation   MPH  2.3  no documentation   METS  1.7  no documentation   RPE  13  no documentation   VO2 Peak  5.96  no documentation   Symptoms  Yes (comment)  no documentation   Comments  4/10 chest presssure, walk test terminated at 5 minutes  no documentation   Resting HR  92 bpm  no documentation   Resting BP  110/60  no documentation   Max Ex. HR  96 bpm  no documentation   Max Ex. BP  130/62  no documentation   2 Minute Post BP  no documentation  108/60          Psychological, QOL, Others - Outcomes: PHQ 2/9: Depression screen PHQ 2/9 04/22/2017  Decreased Interest 0  Down, Depressed, Hopeless 0  PHQ - 2 Score 0    Quality of Life: Quality of Life - 04/24/17 1418    Quality of Life Scores          Health/Function Pre  27.1 %    Socioeconomic Pre  28.07 %    Psych/Spiritual Pre  29.14 %    Family Pre  27.6 %    GLOBAL Pre  27.79 % QOL reviewed with pt.  overall scores very good. pt exhibits positive outlook with good coping skills. pt expresses gratitude for her many blessings.           Personal Goals: Goals established at orientation with interventions provided to work toward goal. Personal Goals and Risk Factors at Admission - 04/11/17 1155    Core Components/Risk  Factors/Patient Goals on Admission          Admit Weight  139 lb 5.3 oz (63.2 kg)    Goal Weight: Short Term  139 lb (63 kg)    Goal Weight: Long Term  139 lb (63 kg)            Personal Goals Discharge: Goals and Risk Factor Review    Core Components/Risk Factors/Patient Goals Review    Row Name 05/07/17 1617 05/28/17 1519 06/28/17 1434 07/23/17 1235 08/29/17 0725   Personal Goals Review  Weight Management/Obesity;Improve shortness of breath with ADL's;Lipids  Weight Management/Obesity;Improve shortness of breath with ADL's;Lipids  Weight Management/Obesity;Improve shortness of breath with ADL's;Lipids  Weight Management/Obesity;Improve shortness of breath with ADL's;Lipids  Weight Management/Obesity;Improve shortness of breath with ADL's;Lipids   Review  pt making proactive efforts to decrease CAD RF and symptoms. pt actively participating in exercise and education offerings. pt has been walking at home with less angina.   pt making proactive efforts to decrease CAD RF and symptoms. pt actively participating in exercise and education offerings. pt has been walking at home with less angina.   pt making proactive efforts to decrease CAD RF and symptoms. pt actively participating in exercise and education offerings. pt has been walking at home with less angina.  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 pt pleased she has tolerated this well.    pt making proactive efforts to decrease CAD RF and symptoms. pt actively participating in exercise and education offerings. pt currenlty wearing event monitor for palpitations. pt and husband have acute health concerns that have decreased activity and increased health related anxiety.    pt making good efforts towards risk factor reducation.  pt completed 33 sessions.    Expected Outcomes  pt will participate in CR exercise, nutrition and lifestyle modification education to reduce overall RF.   pt will participate in CR exercise,  nutrition and lifestyle modification education to reduce overall RF.   pt will participate in CR exercise, nutrition and lifestyle modification education to reduce overall RF.   pt will participate in CR exercise, nutrition and lifestyle modification education to reduce overall RF.   pt will participate in exercise, nutrition and lifestyle modification opporunities to reduce overall RF.  Exercise Goals and Review: Exercise Goals    Exercise Goals    Row Name 04/11/17 1014   Increase Physical Activity  Yes   Intervention  Provide advice, education, support and counseling about physical activity/exercise needs.;Develop an individualized exercise prescription for aerobic and resistive training based on initial evaluation findings, risk stratification, comorbidities and participant's personal goals.   Expected Outcomes  Achievement of increased cardiorespiratory fitness and enhanced flexibility, muscular endurance and strength shown through measurements of functional capacity and personal statement of participant.   Increase Strength and Stamina  Yes Be able to exercise and walk long distances without SOB/CP   Intervention  Develop an individualized exercise prescription for aerobic and resistive training based on initial evaluation findings, risk stratification, comorbidities and participant's personal goals.;Provide advice, education, support and counseling about physical activity/exercise needs.   Expected Outcomes  Achievement of increased cardiorespiratory fitness and enhanced flexibility, muscular endurance and strength shown through measurements of functional capacity and personal statement of participant.   Able to understand and use rate of perceived exertion (RPE) scale  Yes   Intervention  Provide education and explanation on how to use RPE scale   Expected Outcomes  Short Term: Able to use RPE daily in rehab to express subjective intensity level;Long Term:  Able to use RPE to guide  intensity level when exercising independently   Knowledge and understanding of Target Heart Rate Range (THRR)  Yes   Intervention  Provide education and explanation of THRR including how the numbers were predicted and where they are located for reference   Expected Outcomes  Short Term: Able to state/look up THRR;Long Term: Able to use THRR to govern intensity when exercising independently;Short Term: Able to use daily as guideline for intensity in rehab   Able to check pulse independently  Yes   Intervention  Provide education and demonstration on how to check pulse in carotid and radial arteries.;Review the importance of being able to check your own pulse for safety during independent exercise   Expected Outcomes  Short Term: Able to explain why pulse checking is important during independent exercise;Long Term: Able to check pulse independently and accurately   Understanding of Exercise Prescription  Yes   Intervention  Provide education, explanation, and written materials on patient's individual exercise prescription   Expected Outcomes  Short Term: Able to explain program exercise prescription;Long Term: Able to explain home exercise prescription to exercise independently          Nutrition & Weight - Outcomes:  Post Biometrics - 07/24/17 1133     Post  Biometrics          Height  5' 2.25" (1.581 m)    Weight  138 lb 7.2 oz (62.8 kg)    Waist Circumference  30 inches    Hip Circumference  40 inches    Waist to Hip Ratio  0.75 %    BMI (Calculated)  25.12    Triceps Skinfold  30 mm    % Body Fat  37.5 %    Grip Strength  20 kg    Flexibility  0 in    Single Leg Stand  30 seconds           Nutrition: Nutrition Therapy & Goals - 04/11/17 1113    Nutrition Therapy          Diet  Therapeutic Lifestyle Changes        Personal Nutrition Goals          Nutrition Goal  Pt to maintain her current wt while in Cardiac Rehab.         Intervention Plan          Intervention   Prescribe, educate and counsel regarding individualized specific dietary modifications aiming towards targeted core components such as weight, hypertension, lipid management, diabetes, heart failure and other comorbidities.    Expected Outcomes  Short Term Goal: Understand basic principles of dietary content, such as calories, fat, sodium, cholesterol and nutrients.;Long Term Goal: Adherence to prescribed nutrition plan.           Nutrition Discharge: Nutrition Assessments - 04/11/17 1113    MEDFICTS Scores          Pre Score  31           Education Questionnaire Score: Knowledge Questionnaire Score - 04/11/17 1149    Knowledge Questionnaire Score          Pre Score  19/24           Goals reviewed with patient; copy given to patient.

## 2017-08-29 NOTE — Addendum Note (Signed)
Encounter addended by: Lowell Guitar, RN on: 08/29/2017 7:43 AM  Actions taken: Delete clinical note

## 2017-08-29 NOTE — Addendum Note (Signed)
Encounter addended by: Dorna Bloom D on: 08/29/2017 1:59 PM  Actions taken: Visit Navigator Flowsheet section accepted

## 2017-08-29 NOTE — Addendum Note (Signed)
Encounter addended by: Lowell Guitar, RN on: 08/29/2017 7:48 AM  Actions taken: Delete clinical note

## 2017-08-29 NOTE — Addendum Note (Signed)
Encounter addended by: Lowell Guitar, RN on: 08/29/2017 7:30 AM  Actions taken: Flowsheet data copied forward, Visit Navigator Flowsheet section accepted, Sign clinical note

## 2017-08-30 ENCOUNTER — Telehealth (HOSPITAL_COMMUNITY): Payer: Self-pay

## 2017-08-30 NOTE — Progress Notes (Signed)
Discharge Progress Report  Patient Details  Name: Sherry Barnett MRN: [id number] Date of Birth: 1933-04-14 Referring Provider:   Flowsheet Row CARDIAC REHAB PHASE II ORIENTATION from 04/11/2017 in [facility]  Referring Provider  Daneen Schick MD       Number of Visits: 33  Reason for Discharge:  Early Exit:  medical   Smoking History:  Social History   Tobacco Use  Smoking Status Former Smoker  . Packs/day: 0.10  . Years: 5.00  . Pack years: 0.50  . Types: Cigarettes  . Last attempt to quit: 02/06/1969  . Years since quitting: 48.5  Smokeless Tobacco Never Used    Diagnosis:  S/P drug eluting coronary stent placement  ADL [facility]SD:   Initial Exercise Prescription: Initial Exercise Prescription - 04/11/17 1100    Date of Initial Exercise RX and Referring Provider          Date  04/11/17    Referring Provider  Daneen Schick MD        Recumbant Bike          Level  1.5    Watts  10    Minutes  5    METs  2.24        NuStep          Level  2    SPM  75    Minutes  10    METs  2        Track          Laps  6    Minutes  10    METs  2.03        Prescription Details          Frequency (times per week)  3    Duration  Progress to 30 minutes of continuous aerobic without signs/symptoms of physical distress        Intensity          THRR 40-80% of Max Heartrate  55-110    Ratings of Perceived Exertion  11-13    Perceived Dyspnea  0-4        Progression          Progression  Continue to progress workloads to maintain intensity without signs/symptoms of physical distress.        Resistance Training          Training Prescription  Yes    Weight  2lbs    Reps  10-15           Discharge Exercise Prescription (Final Exercise Prescription Changes): Exercise Prescription Changes - 07/29/17 0821    Response to Exercise          Blood Pressure (Admit)  112/60    Blood Pressure (Exercise)  118/54    Blood Pressure  (Exit)  116/74    Heart Rate (Admit)  88 bpm    Heart Rate (Exercise)  86 bpm    Heart Rate (Exit)  66 bpm    Symptoms  none    Duration  Continue with 30 min of aerobic exercise without signs/symptoms of physical distress.    Intensity  THRR unchanged        Progression          Progression  Continue to progress workloads to maintain intensity without signs/symptoms of physical distress.    Average METs  1.6        Resistance Training          Training  Prescription  Yes    Weight  2lbs    Reps  10-15    Time  10 Minutes        Recumbant Bike          Level  3    Watts  8    Minutes  10    METs  1.7        NuStep          Level  3    SPM  60    Minutes  15    METs  1.3        Track          Laps  5    Minutes  10    METs  1.8        Home Exercise Plan          Plans to continue exercise at  Home (comment)    Frequency  Add 2 additional days to program exercise sessions.    Initial Home Exercises Provided  04/29/17           Functional Capacity: 6 Minute Walk    6 Minute Walk    Row Name 04/11/17 1141 04/11/17 1208 07/23/17 1234   Phase  Initial  no documentation  Initial   Distance  733 feet  no documentation  no documentation   Walk Time  4.54 minutes  no documentation  no documentation   # of Rest Breaks  0  no documentation  no documentation   MPH  1.8  no documentation  no documentation   METS  1.1  no documentation  no documentation   VO2 Peak  3.87  no documentation  no documentation   Symptoms  Yes (comment)  no documentation  no documentation   Comments  chest pressure radiating to bilateral jaws; SOB  chest pressure radiating to bilateral jaws; SOB. Test terminated at 4:54. Pt reported 4-5/10 for chest pain/discomfort  no documentation   Resting HR  69 bpm  no documentation  no documentation   Resting BP  112/80  no documentation  no documentation   Resting Oxygen Saturation   97 %  no documentation  no documentation   Exercise Oxygen  Saturation  during 6 min walk  99 %  no documentation  no documentation   Max Ex. HR  90 bpm  no documentation  no documentation   Max Ex. BP  128/84  no documentation  no documentation   2 Minute Post BP  130/51  no documentation  no documentation       6 Minute Walk    Row Name 07/24/17 1124 07/24/17 1136   Phase  Discharge  no documentation   Distance  1000 feet  no documentation   Distance % Change  36.43 %  no documentation   Distance Feet Change  267 ft  no documentation   Walk Time  5 minutes  no documentation   # of Rest Breaks  0  no documentation   MPH  2.3  no documentation   METS  1.7  no documentation   RPE  13  no documentation   VO2 Peak  5.96  no documentation   Symptoms  Yes (comment)  no documentation   Comments  4/10 chest presssure, walk test terminated at 5 minutes  no documentation   Resting HR  92 bpm  no documentation   Resting BP  110/60  no documentation   Max Ex. HR  96 bpm  no documentation   Max Ex. BP  130/62  no documentation   2 Minute Post BP  no documentation  108/60          Psychological, QOL, Others - Outcomes: PHQ 2/9: Depression screen PHQ 2/9 04/22/2017  Decreased Interest 0  Down, Depressed, Hopeless 0  PHQ - 2 Score 0    Quality of Life: Quality of Life - 04/24/17 1418    Quality of Life Scores          Health/Function Pre  27.1 %    Socioeconomic Pre  28.07 %    Psych/Spiritual Pre  29.14 %    Family Pre  27.6 %    GLOBAL Pre  27.79 % QOL reviewed with pt.  overall scores very good. pt exhibits positive outlook with good coping skills. pt expresses gratitude for her many blessings.           Personal Goals: Goals established at orientation with interventions provided to work toward goal. Personal Goals and Risk Factors at Admission - 04/11/17 1155    Core Components/Risk Factors/Patient Goals on Admission          Admit Weight  139 lb 5.3 oz (63.2 kg)    Goal Weight: Short Term  139 lb (63 kg)    Goal Weight: Long  Term  139 lb (63 kg)            Personal Goals Discharge: Goals and Risk Factor Review    Core Components/Risk Factors/Patient Goals Review    Row Name 05/07/17 1617 05/28/17 1519 06/28/17 1434 07/23/17 1235 08/29/17 0725   Personal Goals Review  Weight Management/Obesity;Improve shortness of breath with ADL's;Lipids  Weight Management/Obesity;Improve shortness of breath with ADL's;Lipids  Weight Management/Obesity;Improve shortness of breath with ADL's;Lipids  Weight Management/Obesity;Improve shortness of breath with ADL's;Lipids  Weight Management/Obesity;Improve shortness of breath with ADL's;Lipids   Review  pt making proactive efforts to decrease CAD RF and symptoms. pt actively participating in exercise and education offerings. pt has been walking at home with less angina.   pt making proactive efforts to decrease CAD RF and symptoms. pt actively participating in exercise and education offerings. pt has been walking at home with less angina.   pt making proactive efforts to decrease CAD RF and symptoms. pt actively participating in exercise and education offerings. pt has been walking at home with less angina.  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 pt pleased she has tolerated this well.    pt making proactive efforts to decrease CAD RF and symptoms. pt actively participating in exercise and education offerings. pt currenlty wearing event monitor for palpitations. pt and husband have acute health concerns that have decreased activity and increased health related anxiety.    pt making good efforts towards risk factor reducation.  pt completed 33 sessions.    Expected Outcomes  pt will participate in CR exercise, nutrition and lifestyle modification education to reduce overall RF.   pt will participate in CR exercise, nutrition and lifestyle modification education to reduce overall RF.   pt will participate in CR exercise, nutrition and lifestyle modification education to  reduce overall RF.   pt will participate in CR exercise, nutrition and lifestyle modification education to reduce overall RF.   pt will participate in exercise, nutrition and lifestyle modification opporunities to reduce overall RF.           Exercise Goals and Review: Exercise Goals    Exercise Goals  [place] Name 04/11/17 1014   Increase Physical Activity  Yes   Intervention  Provide advice, education, support and counseling about physical activity/exercise needs.;Develop an individualized exercise prescription for aerobic and resistive training based on initial evaluation findings, risk stratification, comorbidities and participant's personal goals.   Expected Outcomes  Achievement of increased cardiorespiratory fitness and enhanced flexibility, muscular endurance and strength shown through measurements of functional capacity and personal statement of participant.   Increase Strength and Stamina  Yes Be able to exercise and walk long distances without SOB/CP   Intervention  Develop an individualized exercise prescription for aerobic and resistive training based on initial evaluation findings, risk stratification, comorbidities and participant's personal goals.;Provide advice, education, support and counseling about physical activity/exercise needs.   Expected Outcomes  Achievement of increased cardiorespiratory fitness and enhanced flexibility, muscular endurance and strength shown through measurements of functional capacity and personal statement of participant.   Able to understand and use rate of perceived exertion (RPE) scale  Yes   Intervention  Provide education and explanation on how to use RPE scale   Expected Outcomes  Short Term: Able to use RPE daily in rehab to express subjective intensity level;Long Term:  Able to use RPE to guide intensity level when exercising independently   Knowledge and understanding of Target Heart Rate Range (THRR)  Yes   Intervention  Provide education and  explanation of THRR including how the numbers were predicted and where they are located for reference   Expected Outcomes  Short Term: Able to state/look up THRR;Long Term: Able to use THRR to govern intensity when exercising independently;Short Term: Able to use daily as guideline for intensity in rehab   Able to check pulse independently  Yes   Intervention  Provide education and demonstration on how to check pulse in carotid and radial arteries.;Review the importance of being able to check your own pulse for safety during independent exercise   Expected Outcomes  Short Term: Able to explain why pulse checking is important during independent exercise;Long Term: Able to check pulse independently and accurately   Understanding of Exercise Prescription  Yes   Intervention  Provide education, explanation, and written materials on patient's individual exercise prescription   Expected Outcomes  Short Term: Able to explain program exercise prescription;Long Term: Able to explain home exercise prescription to exercise independently          Nutrition & Weight - Outcomes:  Post Biometrics - 07/24/17 1133     Post  Biometrics          Height  5' 2.25" (1.581 m)    Weight  138 lb 7.2 oz (62.8 kg)    Waist Circumference  30 inches    Hip Circumference  40 inches    Waist to Hip Ratio  0.75 %    BMI (Calculated)  25.12    Triceps Skinfold  30 mm    % Body Fat  37.5 %    Grip Strength  20 kg    Flexibility  0 in    Single Leg Stand  30 seconds           Nutrition: Nutrition Therapy & Goals - 04/11/17 1113    Nutrition Therapy          Diet  Therapeutic Lifestyle Changes        Personal Nutrition Goals          Nutrition Goal  Pt to maintain her current wt while in Cardiac Rehab.  Intervention Plan          Intervention  Prescribe, educate and counsel regarding individualized specific dietary modifications aiming towards targeted core components such as weight, hypertension,  lipid management, diabetes, heart failure and other comorbidities.    Expected Outcomes  Short Term Goal: Understand basic principles of dietary content, such as calories, fat, sodium, cholesterol and nutrients.;Long Term Goal: Adherence to prescribed nutrition plan.           Nutrition Discharge: Nutrition Assessments - 04/11/17 1113    MEDFICTS Scores          Pre Score  31           Education Questionnaire Score: Knowledge Questionnaire Score - 04/11/17 1149    Knowledge Questionnaire Score          Pre Score  19/24           Goals reviewed with patient; copy given to patient.

## 2017-08-30 NOTE — Telephone Encounter (Signed)
Patients insurance is active and benefits verified through Medicare A/B - No co-pay, deductible amount of $185.00/$185.00 has been met, no out of pocket, 20% co-insurance, and no pre-authorization is required. Passport/reference [phone]  Patients insurance is active and benefits verified through [facility] - No co-pay, deductible amount of $350.00/$0.00 has been met, out of pocket amount of $5,000/$282.23 has been met, 15% co-insurance, and no pre-authorization is required. Passport/reference [phone]

## 2017-08-30 NOTE — Addendum Note (Signed)
Encounter addended by: Lowell Guitar, RN on: 08/30/2017 9:38 AM  Actions taken: Sign clinical note

## 2017-08-30 NOTE — Addendum Note (Signed)
Encounter addended by: Jewel Baize, RD on: 08/30/2017 9:01 AM  Actions taken: Flowsheet data copied forward, Visit Navigator Flowsheet section accepted

## 2017-09-03 ENCOUNTER — Telehealth (HOSPITAL_COMMUNITY): Payer: Self-pay | Admitting: Cardiac Rehabilitation

## 2017-09-03 NOTE — Telephone Encounter (Signed)
pc to pt to discuss her thoughts on returning to cardiac rehab.  Dr. Tamala Julian had concerns about anxiety associated with rehab.  Pt states she is doing well at home.  Only one incident of exertional chest pain while toting trash can to garden.  No rest pain.  Pt has been able to play organ and exercise 10 minutes on Nustep at [facility] without pain.  Per Dr. Thompson Caul recommendation, pt will not continue cardiac rehab.  She will continue to follow her activity regimen in community.  Andi Hence, RN, BSN Cardiac Pulmonary Rehab

## 2017-10-01 NOTE — Progress Notes (Signed)
Attestation signed by Sueanne Margarita, MD at 08/01/2017 1:12 PM    I have reviewed and agree with the [place] as outlined above.     Cardiac Individual Treatment Plan     Patient Details   Name: Sherry Barnett  MRN: [id number]  Date of Birth: 1933/02/25  Referring Provider:      CARDIAC REHAB PHASE II ORIENTATION from 04/11/2017 in [place]    Referring Provider    Daneen Schick MD       Initial Encounter Date:      CARDIAC REHAB PHASE II ORIENTATION from 04/11/2017 in [place]  Date    04/11/17    Referring Provider    Daneen Schick MD    Visit Diagnosis: S/P drug eluting coronary stent placement     Patient's Home Medications on Admission:     Current Outpatient Medications:   .  alendronate (FOSAMAX) 70 MG tablet, Take 70 mg by mouth every Saturday. Take with a full glass of water on an empty stomach. , Disp: , Rfl:   .  Artificial Tear Ointment (ARTIFICIAL TEARS) ointment, Place 1 drop into both eyes as needed (dry eyes). , Disp: , Rfl:   .  aspirin 81 MG EC tablet, Take 81 mg by mouth daily with breakfast. Swallow whole., Disp: , Rfl:   .  atorvastatin (LIPITOR) 80 MG tablet, TAKE 1 TABLET BY MOUTH EVERY DAY AT 6:00 PM, Disp: 90 tablet, Rfl: 3  .  Azelaic Acid (FINACEA) 15 % cream, Apply 1 application topically at bedtime. After skin is thoroughly washed and patted dry, gently but thoroughly massage a thin film of azelaic acid cream into the affected area twice daily, in the morning and evening. , Disp: , Rfl:   .  calcium carbonate (TUMS EX) 750 MG chewable tablet, Chew 1 tablet by mouth daily., Disp: , Rfl:   .  cholecalciferol (VITAMIN D) 1000 units tablet, Take 1,000 Units by mouth daily., Disp: , Rfl:   .  clopidogrel (PLAVIX) 75 MG tablet, Take 1 tablet (75 mg total) by mouth daily., Disp: 90 tablet, Rfl: 3  .  isosorbide mononitrate (IMDUR) 30 MG 24 hr  tablet, Take 1 tablet (30 mg total) by mouth daily., Disp: 90 tablet, Rfl: 3  .  metoprolol succinate (TOPROL-XL) 50 MG 24 hr tablet, Take 1 tablet (50 mg total) by mouth daily. Take with or immediately following a meal., Disp: 90 tablet, Rfl: 3  .  nitroGLYCERIN (NITROSTAT) 0.4 MG SL tablet, Place 1 tablet (0.4 mg total) under the tongue every 5 (five) minutes as needed for chest pain., Disp: 25 tablet, Rfl: 3  .  pantoprazole (PROTONIX) 40 MG tablet, Take 1 tablet (40 mg total) by mouth daily., Disp: 30 tablet, Rfl: 6  .  tobramycin-dexamethasone (TOBRADEX) ophthalmic ointment, Place 1 application into both eyes See admin instructions. Tuesday AND Saturday patient does not use. Will apply at night on all other nights., Disp: , Rfl:      Past Medical History:       Past Medical History:    Diagnosis   Date    .   A-fib ([place])        .   Bilateral cataracts        .   Blepharitis            chronic    .   CAD (coronary artery disease), native coronary artery  10/18 PCI/DES to p/mLAD, normal EF    .   Cardiovascular risk factor            23%    .   Degenerative joint disease (DJD) of lumbar spine        .   Diverticulosis        .   Endometriosis        .   Fibroids        .   Hematuria, microscopic   [id number]    .   Hemorrhoids        .   Hypercholesterolemia            10 years    .   Osteopenia   03/2016        start alendrodate    .   Renal disease            stage2/stage 3          Tobacco Use:   Social History            Tobacco Use    Smoking Status   Former Smoker    .   Years:   5.00    .   Types:   Cigarettes    .   Last attempt to quit:   02/06/1969    .   Years since quitting:   48.5    Smokeless Tobacco   Never Used          Labs:        Recent Development worker, [employer] for [facility]iac and Pulmonary Rehab    Latest Ref Rng & Units    05/06/2017         Cholestrol   100 - 199 mg/dL   117        LDLCALC   0 - 99 mg/dL   45        HDL   >39 mg/dL   57        Trlycerides   0 - 149 mg/dL   74                 Capillary Blood Glucose:  No results found for: GLUCAP        Exercise Target Goals:      Exercise Program Goal:  Individual exercise prescription set using results from initial 6 min walk test and THRR while considering  patient's activity barriers and safety.      Exercise Prescription Goal:  Initial exercise prescription builds to 30-45 minutes a day of aerobic activity, 2-3 days per week.  Home exercise guidelines will be given to patient during program as part of exercise prescription that the participant will acknowledge.     Activity Barriers & Risk Stratification:       Activity Barriers & Cardiac Risk Stratification - 04/11/17 1157       Activity Barriers & Cardiac Risk Stratification      Cardiac Risk Stratification : Moderate      6 Minute Walk:         6 Minute Walk      Row Name    04/11/17 1141    04/11/17 1208    07/23/17 1234        6 Minute Walk     Phase    Initial    -    Initial  Distance    733 feet    -    -        Walk Time    4.54 minutes    -    -        # of Rest Breaks    0    -    -        MPH    1.8    -    -        METS    1.1    -    -        VO2 Peak    3.87    -    -        Symptoms    Yes (comment)    -    -        Comments    chest pressure radiating to bilateral jaws; SOB    chest pressure radiating to bilateral jaws; SOB. Test terminated at 4:54. Pt reported 4-5/10 for chest pain/discomfort    -        Resting HR    69 bpm    -    -        Resting BP    112/80    -    -        Resting Oxygen  Saturation     97 %    -    -        Exercise Oxygen Saturation  during 6 min walk    99 %    -    -        Max Ex. HR    90 bpm    -    -        Max Ex. BP    128/84    -    -        2 Minute Post BP    130/51    -    -            Row Name    07/24/17 1124    07/24/17 1136                      6 Minute Walk        Phase    Discharge    -            Distance    1000 feet    -            Distance % Change    36.43 %    -            Distance Feet Change    267 ft    -            Walk Time    5 minutes    -            # of Rest Breaks    0    -            MPH    2.3    -            METS    1.7    -            RPE    13    -            VO2 Peak    5.96    -  Symptoms    Yes (comment)    -            Comments    4/10 chest presssure, walk test terminated at 5 minutes    -            Resting HR    92 bpm    -            Resting BP    110/60    -            Max Ex. HR    96 bpm    -            Max Ex. BP    130/62    -            2 Minute Post BP    -    108/60                     Oxygen Initial Assessment:        Oxygen Re-Evaluation:        Oxygen Discharge (Final Oxygen Re-Evaluation):        Initial Exercise Prescription:       Initial Exercise Prescription - 04/11/17 1100                   Date of Initial Exercise RX and Referring Provider        Date    04/11/17         Referring Provider    Daneen Schick MD              Recumbant Bike        Level    1.5         Watts    10         Minutes    5         METs    2.24              NuStep        Level    2         SPM    75         Minutes    10         METs    2               Track        Laps    6         Minutes    10         METs    2.03              Prescription Details        Frequency (times per week)    3         Duration    Progress to 30 minutes of continuous aerobic without signs/symptoms of physical distress              Intensity        THRR 40-80% of Max Heartrate    55-110         Ratings of Perceived Exertion    11-13         Perceived Dyspnea    0-4              Progression        Progression    Continue to progress workloads to maintain intensity without  signs/symptoms of physical distress.              Resistance Training        Training Prescription    Yes         Weight    2lbs         Reps    10-15                 Perform Capillary Blood Glucose checks as needed.              Exercise Prescription Changes: Exercise Prescription Changes            Row Name    04/22/17 1344    05/06/17 1600    05/17/17 1629    05/27/17 1600    06/17/17 1600              Response to Exercise        Blood Pressure (Admit)    130/70    120/80    108/62    122/60    102/70        Blood Pressure (Exercise)    122/80    120/60    116/70    120/60    114/60        Blood Pressure (Exit)    134/62    102/62    112/60    112/64    102/60        Heart Rate (Admit)    70 bpm    75 bpm    79 bpm    79 bpm    78 bpm        Heart Rate (Exercise)    85 bpm    83 bpm    87 bpm    96 bpm    86 bpm        Heart Rate (Exit)    69 bpm    63 bpm    67 bpm    64 bpm    78 bpm        Symptoms    none    none    none    none    none        Comments    pt was oriented to exercise equipment. Pt did well with first exercise session.    -    -    -    -        Duration    Continue with 30 min of aerobic exercise  without signs/symptoms of physical distress.    Continue with 30 min of aerobic exercise without signs/symptoms of physical distress.    Continue with 30 min of aerobic exercise without signs/symptoms of physical distress.    Continue with 30 min of aerobic exercise without signs/symptoms of physical distress.    Continue with 30 min of aerobic exercise without signs/symptoms of physical distress.        Intensity    THRR unchanged    THRR unchanged    THRR unchanged    THRR unchanged    THRR unchanged             Progression        Progression    Continue to progress workloads to maintain intensity without signs/symptoms of physical distress.    Continue to progress workloads to maintain intensity without signs/symptoms of physical distress.    Continue to progress workloads to maintain intensity without signs/symptoms of physical distress.  Continue to progress workloads to maintain intensity without signs/symptoms of physical distress.    Continue to progress workloads to maintain intensity without signs/symptoms of physical distress.        Average METs    1.5    1.9    1.7    1.7    1.7             Resistance Training        Training Prescription    Yes    Yes    Yes    Yes    Yes        Weight    2lbs    2lbs    2lbs    2lbs    2lbs        Reps    10-15    10-15    10-15    10-15    10-15        Time    10 Minutes    10 Minutes    10 Minutes    10 Minutes    10 Minutes             Recumbant Bike        Level    1.5    1.5    2    2    2         Watts    10    10    10    10    12         Minutes    5    5    5    5    5         METs    1.5    1.5    1.5    1.5    2             NuStep        Level    2    3    3    3    3         SPM    50    70    70    70    70        Minutes     10    10    10    10    10         METs    1.3    1.6    1.6    1.6    1.6             Track        Laps    5    9    4    6    5         Minutes    10    10    10    10    10         METs    1.87    2.57    1.7    2.03    1.8             Home Exercise Plan        Plans to continue exercise at    -    Home (comment) seated chair exercises and cardio equipment at senior center    Home (comment) seated chair exercises and cardio equipment at senior center    Home (  comment) seated chair exercises and cardio equipment at senior center    Home (comment) seated chair exercises and cardio equipment at senior center        Frequency    -    Add 2 additional days to program exercise sessions.    Add 2 additional days to program exercise sessions.    Add 2 additional days to program exercise sessions.    Add 2 additional days to program exercise sessions.        Initial Home Exercises Provided    -    04/29/17    04/29/17    04/29/17    04/29/17            Row Name    07/03/17 1100    07/15/17 1521    07/29/17 0821                              Response to Exercise        Blood Pressure (Admit)    118/60    112/62    112/60                Blood Pressure (Exercise)    110/56    116/62    118/54                Blood Pressure (Exit)    104/60    104/68    116/74                Heart Rate (Admit)    75 bpm    77 bpm    88 bpm                Heart Rate (Exercise)    90 bpm    87 bpm    86 bpm                Heart Rate (Exit)    62 bpm    64 bpm    66 bpm                Symptoms    none    none    none                Duration    Continue with 30 min of aerobic exercise without signs/symptoms of physical distress.    Continue with 30 min of aerobic exercise  without signs/symptoms of physical distress.    Continue with 30 min of aerobic exercise without signs/symptoms of physical distress.                Intensity    THRR unchanged    THRR unchanged    THRR unchanged                     Progression        Progression    Continue to progress workloads to maintain intensity without signs/symptoms of physical distress.    Continue to progress workloads to maintain intensity without signs/symptoms of physical distress.    Continue to progress workloads to maintain intensity without signs/symptoms of physical distress.                Average METs    1.9    1.7    1.6                     Resistance Training  Training Prescription    No relaxation day    Yes relaxation day    Yes                Weight    -    2lbs    2lbs                Reps    -    10-15    10-15                Time    -    10 Minutes    10 Minutes                     Recumbant Bike        Level    3    3    3                 Watts    12    8    8                 Minutes    15    15    10                 METs    1.9    1.9    1.7                     NuStep        Level    3    3    3                 SPM    70    70    60                Minutes    15    15    15                 METs    1.5    1.4    1.3                     Track        Laps    5    -    5                Minutes    6    -    10                METs    2.45    -    1.8                     Home Exercise Plan        Plans to continue exercise at    Home (comment)    Home (comment)    Home (comment)                Frequency    Add 2 additional days to program exercise sessions.    Add 2 additional days to  program exercise sessions.    Add 2 additional days to program exercise sessions.                Initial Home Exercises Provided    04/29/17    04/29/17    04/29/17  Exercise Comments: Exercise Comments            Row Name    04/22/17 1345    05/06/17 1643    05/27/17 1103    06/24/17 1217    08/01/17 0823         Exercise Comments    Pt oriented to exercise equipment today. Pt did well with exercise prescription. Will continue to monitor pt's progress and activity levels.     Reviewed METS and goals. Pt is responding well to exercise program/prescription. Recently reviewed HEP, and will continue to monitor pt's fitness and activity levels.     Reviewed METS and goals. Pt is responding well to exercise program/prescription. Recently reviewed HEP, and will continue to monitor pt's fitness and activity levels.     Reviewed METS and goals. Pt is responding well to exercise program/prescription. Recently reviewed HEP, and will continue to monitor pt's fitness and activity levels.     Reviewed METS and goals. Pt is responding well to exercise program/prescription. Recently reviewed HEP, and will continue to monitor pt's fitness and activity levels.                           Exercise Goals and Review: Exercise Goals            Row Name    04/11/17 1014                                              Exercise Goals        Increase Physical Activity    Yes                        Intervention    Provide advice, education, support and counseling about physical activity/exercise needs.;Develop an individualized exercise prescription for aerobic and resistive training based on initial evaluation findings, risk stratification, comorbidities and participant's personal goals.                        Expected Outcomes    Achievement of  increased cardiorespiratory fitness and enhanced flexibility, muscular endurance and strength shown through measurements of functional capacity and personal statement of participant.                        Increase Strength and Stamina    Yes Be able to exercise and walk long distances without SOB/CP                        Intervention    Develop an individualized exercise prescription for aerobic and resistive training based on initial evaluation findings, risk stratification, comorbidities and participant's personal goals.;Provide advice, education, support and counseling about physical activity/exercise needs.                        Expected Outcomes    Achievement of increased cardiorespiratory fitness and enhanced flexibility, muscular endurance and strength shown through measurements of functional capacity and personal statement of participant.                        Able to understand and use rate of perceived exertion (RPE) scale    Yes  Intervention    Provide education and explanation on how to use RPE scale                        Expected Outcomes    Short Term: Able to use RPE daily in rehab to express subjective intensity level;Long Term:  Able to use RPE to guide intensity level when exercising independently                        Knowledge and understanding of Target Heart Rate Range (THRR)    Yes                        Intervention    Provide education and explanation of THRR including how the numbers were predicted and where they are located for reference                        Expected Outcomes    Short Term: Able to state/look up THRR;Long Term: Able to use THRR to govern intensity when exercising independently;Short Term: Able to use daily as guideline for intensity in rehab                        Able to check pulse  independently    Yes                        Intervention    Provide education and demonstration on how to check pulse in carotid and radial arteries.;Review the importance of being able to check your own pulse for safety during independent exercise                        Expected Outcomes    Short Term: Able to explain why pulse checking is important during independent exercise;Long Term: Able to check pulse independently and accurately                        Understanding of Exercise Prescription    Yes                        Intervention    Provide education, explanation, and written materials on patient's individual exercise prescription                        Expected Outcomes    Short Term: Able to explain program exercise prescription;Long Term: Able to explain home exercise prescription to exercise independently                                 Exercise Goals Re-Evaluation :           Exercise Goals Re-Evaluation            Row Name    04/29/17 0946    05/06/17 1644    05/27/17 1100    06/24/17 1217    07/03/17 1658              Exercise Goal Re-Evaluation        Exercise Goals Review    Increase Physical Activity;Able to understand and use rate of perceived exertion (RPE) scale;Knowledge and understanding of Target Heart Rate Range (THRR);Understanding of Exercise  Prescription;Increase Strength and Stamina;Able to check pulse independently    Increase Physical Activity;Able to understand and use rate of perceived exertion (RPE) scale;Knowledge and understanding of Target Heart Rate Range (THRR);Understanding of Exercise Prescription;Increase Strength and Stamina;Able to check pulse independently    Increase Physical Activity;Able to understand and use rate of perceived exertion (RPE) scale;Knowledge and understanding of Target Heart Rate Range (THRR);Understanding of  Exercise Prescription;Increase Strength and Stamina;Able to check pulse independently    Increase Physical Activity;Able to understand and use rate of perceived exertion (RPE) scale;Knowledge and understanding of Target Heart Rate Range (THRR);Understanding of Exercise Prescription;Increase Strength and Stamina;Able to check pulse independently    Increase Physical Activity;Able to understand and use rate of perceived exertion (RPE) scale;Knowledge and understanding of Target Heart Rate Range (THRR);Understanding of Exercise Prescription;Increase Strength and Stamina;Able to check pulse independently        Comments    Reviewed home exercise with pt today.  Pt plans to do seated chair exercise to increase strength and core. Pt will do exercises 2x/week in addition to coming to cardiac rehab  Reviewed THR, pulse, RPE, sign and symptoms, NTG use, and when to call 911 or MD.  Also discussed weather considerations and indoor options.  Pt voiced understanding.    Pt has increase exercise tolerance and walking capacity. Pt initially started with 5 laps of walking and is now up to 9 laps. Pt has c/o of anginal like symptoms with walking uphill and incline. Informed cardiology in which she will meet for further evaluation on 05/07/17. Will f/u.    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 Pt modified performance in which she " lip sync" for the remainder of the concert. Pt stated that she is able to enjoy activities but takes breaks every now and then.     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 Pt made mention of fatigue and SOB. Discussed activity limitations, and taking breaks. Pt voiced understanding.    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 Pt made mention of fatigue and SOB. Discussed activity limitations, and taking breaks. Pt voiced understanding.        Expected Outcomes    Pt will be compliant with HEP and  improve in cardiorespiratory fitness    Pt will be compliant with HEP and improve in cardiorespiratory fitness    Pt will continue to improve in cardiorespiratory fitness and continue to get back into activities while understanding limitations    Pt will continue to improve in cardiorespiratory fitness and continue to get back into activities while understanding limitations    Pt will continue to improve in cardiorespiratory fitness and continue to get back into activities while understanding limitations            Row Name    08/01/17 0824                                              Exercise Goal Re-Evaluation        Exercise Goals Review    Increase Physical Activity;Able to understand and use rate of perceived exertion (RPE) scale;Knowledge and understanding of Target Heart Rate Range (THRR);Understanding of Exercise Prescription;Increase Strength and Stamina;Able to check pulse independently  Comments    Pt is maintaining WL's but is often time limited by anginal symptoms/chest discomfort. Pt did report chest discomfort on 07/31/17 in which RN and cardiology team were notified for further evaluation.                        Expected Outcomes    Pt will continue to improve in cardiorespiratory fitness and continue to get back into activities while understanding limitations                                    Discharge Exercise Prescription (Final Exercise Prescription Changes):       Exercise Prescription Changes - 07/29/17 0821                   Response to Exercise        Blood Pressure (Admit)    112/60         Blood Pressure (Exercise)    118/54         Blood Pressure (Exit)    116/74         Heart Rate (Admit)    88 bpm         Heart Rate (Exercise)    86 bpm         Heart Rate (Exit)    66 bpm         Symptoms    none          Duration    Continue with 30 min of aerobic exercise without signs/symptoms of physical distress.         Intensity    THRR unchanged              Progression        Progression    Continue to progress workloads to maintain intensity without signs/symptoms of physical distress.         Average METs    1.6              Resistance Training        Training Prescription    Yes         Weight    2lbs         Reps    10-15         Time    10 Minutes              Recumbant Bike        Level    3         Watts    8         Minutes    10         METs    1.7              NuStep        Level    3         SPM    60         Minutes    15         METs    1.3              Track        Laps    5         Minutes    10         METs  1.8              Home Exercise Plan        Plans to continue exercise at    Home (comment)         Frequency    Add 2 additional days to program exercise sessions.         Initial Home Exercises Provided    04/29/17                 Nutrition:   Target Goals: Understanding of nutrition guidelines, daily intake of sodium 1500mg , cholesterol 200mg , calories 30% from fat and 7% or less from saturated fats, daily to have 5 or more servings of fruits and vegetables.     Biometrics:          Post Biometrics - 07/24/17 1133                    Post  Biometrics        Height    5' 2.25" (1.581 m)         Weight    138 lb 7.2 oz (62.8 kg)         Waist Circumference    30 inches         Hip Circumference    40 inches         Waist to Hip Ratio    0.75 %         BMI (Calculated)    25.12         Triceps Skinfold    30 mm         % Body Fat    37.5 %         Grip Strength    20 kg         Flexibility    0 in          Single Leg Stand    30 seconds                 Nutrition Therapy Plan and Nutrition Goals:       Nutrition Therapy & Goals - 04/11/17 1113                   Nutrition Therapy        Diet    Therapeutic Lifestyle Changes              Personal Nutrition Goals        Nutrition Goal    Pt to maintain her current wt while in Cardiac Rehab.               Intervention Plan        Intervention    Prescribe, educate and counsel regarding individualized specific dietary modifications aiming towards targeted core components such as weight, hypertension, lipid management, diabetes, heart failure and other comorbidities.         Expected Outcomes    Short Term Goal: Understand basic principles of dietary content, such as calories, fat, sodium, cholesterol and nutrients.;Long Term Goal: Adherence to prescribed nutrition plan.                 Nutrition Assessments:       Nutrition Assessments - 04/11/17 1113                   MEDFICTS Scores        Pre Score    31  Nutrition Goals Re-Evaluation:        Nutrition Goals Re-Evaluation:        Nutrition Goals Discharge (Final Nutrition Goals Re-Evaluation):        Psychosocial:  Target Goals: Acknowledge presence or absence of significant depression and/or stress, maximize coping skills, provide positive support system. Participant is able to verbalize types and ability to use techniques and skills needed for reducing stress and depression.     Initial Review & Psychosocial Screening:       Initial Psych Review & Screening - 04/11/17 1245                   Initial Review        Current issues with    None Identified              Family Dynamics        Good Support System?    Yes         Comments    Pt husband, former participant in cardiac rehab is supportive of pt rehab                Barriers        Psychosocial barriers to participate in program    The patient should benefit from training in stress management and relaxation.              Screening Interventions        Interventions    Encouraged to exercise                 Quality of Life Scores:       Quality of Life - 04/24/17 1418                   Quality of Life Scores        Health/Function Pre    27.1 %         Socioeconomic Pre    28.07 %         Psych/Spiritual Pre    29.14 %         Family Pre    27.6 %         GLOBAL Pre    27.79 % QOL reviewed with pt.  overall scores very good. pt exhibits positive outlook with good coping skills. pt expresses gratitude for her many blessings.              Scores of 19 and below usually indicate a poorer quality of life in these areas.  A difference of  2-3 points is a clinically meaningful difference.  A difference of 2-3 points in the total score of the Quality of Life Index has been associated with significant improvement in overall quality of life, self-image, physical symptoms, and general health in studies assessing change in quality of life.     PHQ-9:       Recent Review Flowsheet Data            Depression screen [facility] 2/9    04/22/2017         Decreased Interest   0        Down, Depressed, Hopeless   0        PHQ - 2 Score   0              Interpretation of Total Score   Total Score Depression Severity:   1-4 = Minimal depression, 5-9 = Mild depression, 10-14 = Moderate depression,  15-19 = Moderately severe depression, 20-27 = Severe depression      Psychosocial Evaluation and Intervention:       Psychosocial Evaluation - 04/22/17 1108                   Psychosocial Evaluation & Interventions        Interventions    Encouraged to exercise with the program and follow exercise prescription         Comments     no psychosocial needs identified, no interventions necessary          Expected Outcomes    pt will exhibit positive outlook with good coping skills.          Continue Psychosocial Services     No Follow up required                 Psychosocial Re-Evaluation:           Psychosocial Re-Evaluation            [place] Name    05/07/17 1619    05/28/17 1519    06/28/17 1437    07/23/17 1237                      Psychosocial Re-Evaluation        Current issues with    None Identified    None Identified    None Identified    Current Anxiety/Panic            Comments    no psychosocial needs identified, no interventions necessary     no psychosocial needs identified, no interventions necessary     no psychosocial needs identified, no interventions necessary     pt exhibits health related anxiety from her husband and her own acute health concerns. pt encouraged to practice stress management techniques to decrease anxiety and fear.              Expected Outcomes    pt will exhibit positive outlook with good coping skills.     pt will exhibit positive outlook with good coping skills.     pt will exhibit positive outlook with good coping skills.     pt will exhibit positive outlook with good coping skills.             Interventions    Encouraged to attend Cardiac Rehabilitation for the exercise    Encouraged to attend Cardiac Rehabilitation for the exercise    Encouraged to attend Cardiac Rehabilitation for the exercise    Encouraged to attend Cardiac Rehabilitation for the exercise                     Psychosocial Discharge (Final Psychosocial Re-Evaluation):       Psychosocial Re-Evaluation - 07/23/17 1237                   Psychosocial Re-Evaluation        Current issues with    Current Anxiety/Panic         Comments    pt exhibits health related anxiety from her  husband and her own acute health concerns. pt encouraged to practice stress management techniques to decrease anxiety and fear.           Expected Outcomes    pt will exhibit positive outlook with good coping skills.          Interventions    Encouraged to attend Cardiac Rehabilitation  for the exercise                 Vocational Rehabilitation:  Provide vocational rehab assistance to qualifying candidates.      Vocational Rehab Evaluation & Intervention:       Vocational Rehab - 04/11/17 1246                   Initial Vocational Rehab Evaluation & Intervention        Assessment shows need for Vocational Rehabilitation    No                 Education:  Education Goals: Education classes will be provided on a weekly basis, covering required topics. Participant will state understanding/return demonstration of topics presented.     Learning Barriers/Preferences:       Learning Barriers/Preferences - 04/11/17 1013                   Learning Barriers/Preferences        Learning Barriers    Sight         Learning Preferences    Written Material;Video;Pictoral                 Education Topics:  Count Your Pulse:   -Group instruction provided by verbal instruction, demonstration, patient participation and written materials to support subject.  Instructors address importance of being able to find your pulse and how to count your pulse when at home without a heart monitor.  Patients get hands on experience counting their pulse with staff help and individually.         CARDIAC REHAB PHASE II EXERCISE from 07/31/2017 in [place]     Date    07/05/17    Educator    RN                 Heart Attack, Angina, and Risk Factor Modification:   -Group instruction provided by verbal instruction, video, and written materials to support subject.  Instructors address  signs and symptoms of angina and heart attacks.    Also discuss risk factors for heart disease and how to make changes to improve heart health risk factors.         CARDIAC REHAB PHASE II EXERCISE from 07/31/2017 in [place]     Date    06/12/17                 Functional Fitness:   -Group instruction provided by verbal instruction, demonstration, patient participation, and written materials to support subject.  Instructors address safety measures for doing things around the house.  Discuss how to get up and down off the floor, how to pick things up properly, how to safely get out of a chair without assistance, and balance training.         CARDIAC REHAB PHASE II EXERCISE from 07/31/2017 in [place]     Date    04/26/17                 Meditation and Mindfulness:   -Group instruction provided by verbal instruction, patient participation, and written materials to support subject.  Instructor addresses importance of mindfulness and meditation practice to help reduce stress and improve awareness.  Instructor also leads participants through a meditation exercise.          CARDIAC REHAB PHASE II EXERCISE from 07/31/2017 in [place]     Date    05/15/17                 Stretching for Flexibility and Mobility:   -Group instruction provided by verbal instruction, patient participation, and written materials to support subject.  Instructors lead participants through series of stretches that are designed to increase flexibility thus improving mobility.  These stretches are additional exercise for major muscle groups that are typically performed during regular warm up and cool down.        Hands Only CPR:   -Group verbal, video, and participation provides a basic overview of AHA guidelines for community CPR. Role-play of emergencies allow  participants the opportunity to practice calling for help and chest compression technique with discussion of AED use.         CARDIAC REHAB PHASE II EXERCISE from 07/31/2017 in [place]     Date    07/12/17    Instruction Review Code    2- Demonstrated Understanding                 Hypertension:  -Group verbal and written instruction that provides a basic overview of hypertension including the most recent diagnostic guidelines, risk factor reduction with self-care instructions and medication management.         CARDIAC REHAB PHASE II EXERCISE from 07/31/2017 in [place]     Date    06/21/17                    Nutrition I class: Heart Healthy Eating:   -Group instruction provided by PowerPoint slides, verbal discussion, and written materials to support subject matter. The instructor gives an explanation and review of the Therapeutic Lifestyle Changes diet recommendations, which includes a discussion on lipid goals, dietary fat, sodium, fiber, plant stanol/sterol esters, sugar, and the components of a well-balanced, healthy diet.        Nutrition II class: Lifestyle Skills:   -Group instruction provided by PowerPoint slides, verbal discussion, and written materials to support subject matter. The instructor gives an explanation and review of label reading, grocery shopping for heart health, heart healthy recipe modifications, and ways to make healthier choices when eating out.        Diabetes Question & Answer:   -Group instruction provided by PowerPoint slides, verbal discussion, and written materials to support subject matter. The instructor gives an explanation and review of diabetes co-morbidities, pre- and post-prandial blood glucose goals, pre-exercise blood glucose goals, signs, symptoms, and treatment of hypoglycemia and hyperglycemia, and foot care basics.          CARDIAC REHAB PHASE II EXERCISE from 07/31/2017 in [place]     Date    06/28/17    Educator    RD                 Diabetes Blitz:   -Group instruction provided by PowerPoint slides, verbal discussion, and written materials to support subject matter. The instructor gives an explanation and review of the physiology behind type 1 and type 2 diabetes, diabetes medications and rational behind using different medications, pre- and post-prandial blood glucose recommendations and Hemoglobin A1c goals, diabetes diet, and exercise including blood glucose guidelines for exercising safely.         Portion Distortion:   -Group instruction provided by PowerPoint slides, verbal discussion, written materials, and food models to support  subject matter. The instructor gives an explanation of serving size versus portion size, changes in portions sizes over the last 20 years, and what consists of a serving from each food group.        Stress Management:   -Group instruction provided by verbal instruction, video, and written materials to support subject matter.  Instructors review role of stress in heart disease and how to cope with stress positively.           CARDIAC REHAB PHASE II EXERCISE from 07/31/2017 in [place]     Date    06/05/17                 Exercising on Your Own:   -Group instruction provided by verbal instruction, power point, and written materials to support subject.  Instructors discuss benefits of exercise, components of exercise, frequency and intensity of exercise, and end points for exercise.  Also discuss use of nitroglycerin and activating EMS.  Review options of places to exercise outside of rehab.  Review guidelines for sex with heart disease.         CARDIAC REHAB PHASE II EXERCISE from 07/31/2017 in [place]     Date     06/26/17                 Cardiac Drugs I:   -Group instruction provided by verbal instruction and written materials to support subject.  Instructor reviews cardiac drug classes: antiplatelets, anticoagulants, beta blockers, and statins.  Instructor discusses reasons, side effects, and lifestyle considerations for each drug class.         CARDIAC REHAB PHASE II EXERCISE from 07/31/2017 in [place]     Date    06/19/17                 Cardiac Drugs II:   -Group instruction provided by verbal instruction and written materials to support subject.  Instructor reviews cardiac drug classes: angiotensin converting enzyme inhibitors (ACE-I), angiotensin II receptor blockers (ARBs), nitrates, and calcium channel blockers.  Instructor discusses reasons, side effects, and lifestyle considerations for each drug class.        Anatomy and Physiology of the Circulatory System:   Group verbal and written instruction and models provide basic cardiac anatomy and physiology, with the coronary electrical and arterial systems. Review of: AMI, Angina, Valve disease, Heart Failure, Peripheral Artery Disease, Cardiac Arrhythmia, Pacemakers, and the ICD.         CARDIAC REHAB PHASE II EXERCISE from 07/31/2017 in [place]     Date    07/31/17    Instruction Review Code    2- Demonstrated Understanding                 Other Education:   -Group or individual verbal, written, or video instructions that support the educational goals of the cardiac rehab program.        Holiday Eating Survival Tips:   -Group instruction provided by PowerPoint slides, verbal discussion, and written materials to support subject matter. The instructor gives patients tips, tricks, and techniques to help them not only survive but enjoy the holidays despite the onslaught of food that accompanies the holidays.         Knowledge Questionnaire Score:       Knowledge Questionnaire Score - 04/11/17 1149  Knowledge Questionnaire Score        Pre Score    19/24                 Core Components/Risk Factors/Patient Goals at Admission:       Personal Goals and Risk Factors at Admission - 04/11/17 1155                   Core Components/Risk Factors/Patient Goals on Admission        Admit Weight    139 lb 5.3 oz (63.2 kg)         Goal Weight: Short Term    139 lb (63 kg)         Goal Weight: Long Term    139 lb (63 kg)                 Core Components/Risk Factors/Patient Goals Review:            Goals and Risk Factor Review            Row Name    05/07/17 1617    05/28/17 1519    06/28/17 1434    07/23/17 1235                      Core Components/Risk Factors/Patient Goals Review        Personal Goals Review    Weight Management/Obesity;Improve shortness of breath with ADL's;Lipids    Weight Management/Obesity;Improve shortness of breath with ADL's;Lipids    Weight Management/Obesity;Improve shortness of breath with ADL's;Lipids    Weight Management/Obesity;Improve shortness of breath with ADL's;Lipids            Review    pt making proactive efforts to decrease CAD RF and symptoms. pt actively participating in exercise and education offerings. pt has been walking at home with less angina.     pt making proactive efforts to decrease CAD RF and symptoms. pt actively participating in exercise and education offerings. pt has been walking at home with less angina.     pt making proactive efforts to decrease CAD RF and symptoms. pt actively participating in exercise and education offerings. pt has been walking at home with less angina.  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 pt pleased she has tolerated this well.      pt making proactive efforts to decrease  CAD RF and symptoms. pt actively participating in exercise and education offerings. pt currenlty wearing event monitor for palpitations. pt and husband have acute health concerns that have decreased activity and increased health related anxiety.              Expected Outcomes    pt will participate in CR exercise, nutrition and lifestyle modification education to reduce overall RF.     pt will participate in CR exercise, nutrition and lifestyle modification education to reduce overall RF.     pt will participate in CR exercise, nutrition and lifestyle modification education to reduce overall RF.     pt will participate in CR exercise, nutrition and lifestyle modification education to reduce overall RF.                      Core Components/Risk Factors/Patient Goals at Discharge (Final Review):        Goals and Risk Factor Review - 07/23/17 1235  Core Components/Risk Factors/Patient Goals Review        Personal Goals Review    Weight Management/Obesity;Improve shortness of breath with ADL's;Lipids         Review    pt making proactive efforts to decrease CAD RF and symptoms. pt actively participating in exercise and education offerings. pt currenlty wearing event monitor for palpitations. pt and husband have acute health concerns that have decreased activity and increased health related anxiety.           Expected Outcomes    pt will participate in CR exercise, nutrition and lifestyle modification education to reduce overall RF.                  ITP Comments:           ITP Comments            Row Name    04/11/17 0932    04/22/17 1108    05/07/17 1616    05/28/17 1519    06/28/17 1433         ITP Comments    Dr Fransico Him, Medical Director    pt tolerated first group exercise session without difficulty.    30 day ITP review.  pt demonstrates eagerness to participate in group exercise.   pt with good attendance and participation.     30 day ITP review.  pt demonstrates eagerness to participate in group exercise.  pt with good attendance and participation.     30 day ITP review.  pt demonstrates eagerness to participate in group exercise.  pt with good attendance and participation.             [place] Name    07/23/17 1234                                         ITP Comments    30 day ITP review.  pt demonstrates eagerness to participate in group exercise.  pt with good attendance and participation.                                  Comments:           Deleted in error:   Lowell Guitar, RN at 08/29/2017  7:42 AM  Original Note:  07/31/2017  Andi Hence, RN, BSN  Cosigned by: Sueanne Margarita, MD at 08/01/2017  1:12 PM

## 2017-10-11 ENCOUNTER — Telehealth: Payer: Self-pay | Admitting: Interventional Cardiology

## 2017-10-11 NOTE — Telephone Encounter (Signed)
Spoke with Jinny Blossom, PharmD and she said ok to use either short term and low dose as she is on ASA and Plavix.  Spoke with pt and made her aware ok to use short term.  Pt verbalized understanding and was appreciative for call.

## 2017-10-11 NOTE — Telephone Encounter (Signed)
New Message   Pt c/o medication issue:  1. Name of Medication: naproxen or preidisone  2. How are you currently taking this medication (dosage and times per day)?   3. Are you having a reaction (difficulty breathing--STAT)?  4. What is your medication issue?Patient is calling because she is having some back issues and wants to know will it be okay if she takes the above mentioned medication

## 2017-12-06 ENCOUNTER — Encounter: Payer: Self-pay | Admitting: Interventional Cardiology

## 2017-12-15 NOTE — Progress Notes (Signed)
Cardiology Office Note    Date:  12/16/2017   ID:  Barnett, Sherry [birth year], MRN [id number]  PCP:  Lajean Manes, MD  Cardiologist: Sinclair Grooms, MD   Chief Complaint  Patient presents with  . Coronary Artery Disease    History of Present Illness:  Sherry Barnett is a 84 y.o. female exertional chest and jaw discomfortwhich upon evaluation with coronary angiography in October 2018 identified high-grade obstruction mid [name] treated with a drug-eluting stent, prior history of paroxysmal/episodic atrial fibrillation but no documenting data availableand on no anticoagulation therapy.   Chest discomfort if she bends over as when she is working in her yard.  With exercise and physical activity no chest discomfort occurs.  No medication side effects.   Past Medical History:  Diagnosis Date  . A-fib ([place])   . Anemia   . Arthritis    "a little in my hands" (08/13/2017)  . Blepharitis    chronic  . CAD (coronary artery disease), native coronary artery    10/18 PCI/DES to p/mLAD, normal EF  . Cardiovascular risk factor    23%  . Degenerative joint disease (DJD) of lumbar spine   . Diverticulosis   . Endometriosis   . Fibroids   . GERD (gastroesophageal reflux disease)   . Hematuria, microscopic F120055  . Hemorrhoids   . Hypercholesterolemia    10 years  . Osteopenia 03/2016   start alendrodate  . PONV (postoperative nausea and vomiting)   . Renal disease    stage2/stage 3    Past Surgical History:  Procedure Laterality Date  . APPENDECTOMY    . CARDIAC CATHETERIZATION  08/13/2017  . CATARACT EXTRACTION W/ INTRAOCULAR LENS  IMPLANT, BILATERAL Bilateral 2001 -  11/2016   "left-right"  . CORONARY ANGIOPLASTY WITH STENT PLACEMENT    . CORONARY STENT INTERVENTION N/A 03/14/2017   Procedure: CORONARY STENT INTERVENTION;  Surgeon: Belva Crome, MD;  Location: [facility];  Service: Cardiovascular;  Laterality: N/A;  . DILATION AND CURETTAGE OF UTERUS    . LEFT  HEART CATH AND CORONARY ANGIOGRAPHY N/A 03/14/2017   Procedure: LEFT HEART CATH AND CORONARY ANGIOGRAPHY;  Surgeon: Belva Crome, MD;  Location: [facility];  Service: Cardiovascular;  Laterality: N/A;  . LEFT HEART CATH AND CORONARY ANGIOGRAPHY N/A 08/13/2017   Procedure: LEFT HEART CATH AND CORONARY ANGIOGRAPHY;  Surgeon: Belva Crome, MD;  Location: [facility];  Service: Cardiovascular;  Laterality: N/A;  . OVARIAN CYST SURGERY Bilateral   . SALPINGOOPHORECTOMY Bilateral    endometriosis; "still have a piece of my left ovary"  . TONSILLECTOMY      Current Medications: Outpatient Medications Prior to Visit  Medication Sig Dispense Refill  . alendronate (FOSAMAX) 70 MG tablet Take 70 mg by mouth every Saturday. Take with a full glass of water on an empty stomach.     . Artificial Tear Ointment (ARTIFICIAL TEARS) ointment Place 1 drop into both eyes 4 (four) times daily as needed (dry eyes). Refresh Tears for dry eyes.    Marland Kitchen aspirin 81 MG EC tablet Take 81 mg by mouth daily with breakfast. Swallow whole.    Marland Kitchen atorvastatin (LIPITOR) 80 MG tablet TAKE 1 TABLET BY MOUTH EVERY DAY AT 6:00 PM 90 tablet 3  . Azelaic Acid (FINACEA) 15 % cream Apply 1 application topically 2 (two) times daily. After skin is thoroughly washed and patted dry, gently but thoroughly massage a thin film of  azelaic acid cream into the affected area twice daily, in the morning and evening.     . calcium carbonate (TUMS EX) 750 MG chewable tablet Chew 1-2 tablets by mouth 2 (two) times daily as needed (for hearburn/indigestion.).     Marland Kitchen cholecalciferol (VITAMIN D) 1000 units tablet Take 1,000 Units by mouth daily.    . clopidogrel (PLAVIX) 75 MG tablet Take 1 tablet (75 mg total) by mouth daily. 90 tablet 3  . isosorbide mononitrate (IMDUR) 60 MG 24 hr tablet Take 1 tablet (60 mg total) by mouth daily. 90 tablet 3  . metoprolol succinate (TOPROL-XL) 50 MG 24 hr tablet Take 1 tablet (50 mg total) by mouth daily.  Take with or immediately following a meal. 90 tablet 3  . nitroGLYCERIN (NITROSTAT) 0.4 MG SL tablet Place 1 tablet (0.4 mg total) under the tongue every 5 (five) minutes as needed for chest pain. 25 tablet 3  . pantoprazole (PROTONIX) 40 MG tablet Take 40 mg by mouth daily as needed (acid reflux).    Marland Kitchen tobramycin-dexamethasone (TOBRADEX) ophthalmic ointment Place 1 application into both eyes See admin instructions. Tuesday AND Saturday patient does not use. Will apply at night on all other nights.    . pantoprazole (PROTONIX) 40 MG tablet Take 1 tablet (40 mg total) by mouth daily. (Patient not taking: Reported on 12/16/2017) 30 tablet 6   No facility-administered medications prior to visit.      Allergies:   Sulfa antibiotics; Novocain [procaine]; and Penicillins   Social History   Socioeconomic History  . Marital status: Married    Spouse name: Not on file  . Number of children: Not on file  . Years of education: Not on file  . Highest education level: Not on file  Occupational History  . Not on file  Social Needs  . Financial resource strain: Not on file  . Food insecurity:    Worry: Not on file    Inability: Not on file  . Transportation needs:    Medical: Not on file    Non-medical: Not on file  Tobacco Use  . Smoking status: Former Smoker    Packs/day: 0.10    Years: 5.00    Pack years: 0.50    Types: Cigarettes    Last attempt to quit: 02/06/1969    Years since quitting: 48.8  . Smokeless tobacco: Never Used  Substance and Sexual Activity  . Alcohol use: No  . Drug use: No  . Sexual activity: Not on file    Comment: MARRIED  Lifestyle  . Physical activity:    Days per week: Not on file    Minutes per session: Not on file  . Stress: Not on file  Relationships  . Social connections:    Talks on phone: Not on file    Gets together: Not on file    Attends religious service: Not on file    Active member of club or organization: Not on file    Attends meetings of  clubs or organizations: Not on file    Relationship status: Not on file  Other Topics Concern  . Not on file  Social History Narrative  . Not on file     Family History:  The patient's family history includes Alzheimer's disease in her father; Heart attack in her mother; Hypertension in her father; Other (age of onset: 16) in her sister; Stroke in her father.   ROS:   Please see the history of present illness.  Cognitive impairment mostly with memory of recent events.  Some shortness of breath with activity, constipation, transient/recurrent leg swelling usually late in the day especially if she has been standing.  Easy bruising. All other systems reviewed and are negative.   PHYSICAL EXAM:   VS:  BP 116/64   Pulse (!) 57   Ht 5\' 2"  (1.575 m)   Wt 139 lb 12.8 oz (63.4 kg)   BMI 25.57 kg/m    GEN: Well nourished, well developed, in no acute distress  HEENT: normal  Neck: no JVD, carotid bruits, or masses Cardiac: RRR; no murmurs, rubs, or gallops,no edema  Respiratory:  clear to auscultation bilaterally, normal work of breathing GI: soft, nontender, nondistended, + BS MS: no deformity or atrophy  Skin: warm and dry, no rash Neuro:  Alert and Oriented x 3, Strength and sensation are intact Psych: euthymic mood, full affect  Wt Readings from Last 3 Encounters:  12/16/17 139 lb 12.8 oz (63.4 kg)  08/27/17 140 lb 6.4 oz (63.7 kg)  08/14/17 136 lb 11 oz (62 kg)      Studies/Labs Reviewed:   EKG:  EKG  Not repeated  Recent Labs: 05/23/2017: ALT 34 08/14/2017: BUN 12; Creatinine, Ser 0.95; Hemoglobin 10.6; Platelets 177; Potassium 4.1; Sodium 137   Lipid Panel    Component Value Date/Time   CHOL 117 05/06/2017 0806   TRIG 74 05/06/2017 0806   HDL 57 05/06/2017 0806   CHOLHDL 2.1 05/06/2017 0806   LDLCALC 45 05/06/2017 0806    Additional studies/ records that were reviewed today include:  09/2017 continuous monitor:  Study Highlights     NSR  Frequent  PAC's  No atrial fibrillation  Correlation between symptoms of palpitations and chest discomfort with premature beats.   NSR No atrial fib Fair to good correlation between symptoms and PAC's       ASSESSMENT:    1. Paroxysmal atrial fibrillation (HCC)   2. Hyperlipidemia LDL goal <70   3. Coronary artery disease involving native coronary artery of native heart with angina pectoris ([place])      PLAN:  In order of problems listed above:  1. No clinical recurrence 2. LDL target less than 70.  Most recent 1 in November 2018. 3. [name] stent in October 2018.  Still on dual antiplatelet therapy which we will continue until her return visit in 4 to 6 months.  Clinical follow-up in 4 to 6 months.  No change in therapy.  Encourage physical activity.    Medication Adjustments/Labs and Tests Ordered: Current medicines are reviewed at length with the patient today.  Concerns regarding medicines are outlined above.  Medication changes, Labs and Tests ordered today are listed in the Patient Instructions below. Patient Instructions  Medication Instructions:  Your physician recommends that you continue on your current medications as directed. Please refer to the Current Medication list given to you today.  Labwork: None  Testing/Procedures: None  Follow-Up: Your physician wants you to follow-up in: January or February with Dr. Tamala Julian.  You will receive a reminder letter in the mail two months in advance. If you don't receive a letter, please call our office to schedule the follow-up appointment.   Any Other Special Instructions Will Be Listed Below (If Applicable).     If you need a refill on your cardiac medications before your next appointment, please call your pharmacy.      Signed, Sinclair Grooms, MD  12/16/2017 10:25 AM    [facility] [address], [place], [place]  [address] Phone: [phone]; Fax: [phone]

## 2017-12-16 ENCOUNTER — Ambulatory Visit (INDEPENDENT_AMBULATORY_CARE_PROVIDER_SITE_OTHER): Payer: Medicare Other | Admitting: Interventional Cardiology

## 2017-12-16 ENCOUNTER — Encounter: Payer: Self-pay | Admitting: Interventional Cardiology

## 2017-12-16 VITALS — BP 116/64 | HR 57 | Ht 62.0 in | Wt 139.8 lb

## 2017-12-16 DIAGNOSIS — I25119 Atherosclerotic heart disease of native coronary artery with unspecified angina pectoris: Secondary | ICD-10-CM | POA: Diagnosis not present

## 2017-12-16 DIAGNOSIS — I480 Paroxysmal atrial fibrillation: Secondary | ICD-10-CM

## 2017-12-16 DIAGNOSIS — I208 Other forms of angina pectoris: Secondary | ICD-10-CM

## 2017-12-16 DIAGNOSIS — E785 Hyperlipidemia, unspecified: Secondary | ICD-10-CM | POA: Diagnosis not present

## 2017-12-16 NOTE — Patient Instructions (Signed)
Medication Instructions:  Your physician recommends that you continue on your current medications as directed. Please refer to the Current Medication list given to you today.  Labwork: None  Testing/Procedures: None  Follow-Up: Your physician wants you to follow-up in: January or February with Dr. Tamala Julian.  You will receive a reminder letter in the mail two months in advance. If you don't receive a letter, please call our office to schedule the follow-up appointment.   Any Other Special Instructions Will Be Listed Below (If Applicable).     If you need a refill on your cardiac medications before your next appointment, please call your pharmacy.

## 2017-12-27 DIAGNOSIS — H43813 Vitreous degeneration, bilateral: Secondary | ICD-10-CM | POA: Diagnosis not present

## 2017-12-27 DIAGNOSIS — H26491 Other secondary cataract, right eye: Secondary | ICD-10-CM | POA: Diagnosis not present

## 2017-12-27 DIAGNOSIS — H52203 Unspecified astigmatism, bilateral: Secondary | ICD-10-CM | POA: Diagnosis not present

## 2017-12-27 DIAGNOSIS — H0100A Unspecified blepharitis right eye, upper and lower eyelids: Secondary | ICD-10-CM | POA: Diagnosis not present

## 2018-01-16 DIAGNOSIS — H26491 Other secondary cataract, right eye: Secondary | ICD-10-CM | POA: Diagnosis not present

## 2018-02-17 DIAGNOSIS — E7800 Pure hypercholesterolemia, unspecified: Secondary | ICD-10-CM | POA: Diagnosis not present

## 2018-02-17 DIAGNOSIS — I480 Paroxysmal atrial fibrillation: Secondary | ICD-10-CM | POA: Diagnosis not present

## 2018-02-17 DIAGNOSIS — Z79899 Other long term (current) drug therapy: Secondary | ICD-10-CM | POA: Diagnosis not present

## 2018-02-17 DIAGNOSIS — Z1389 Encounter for screening for other disorder: Secondary | ICD-10-CM | POA: Diagnosis not present

## 2018-02-17 DIAGNOSIS — I209 Angina pectoris, unspecified: Secondary | ICD-10-CM | POA: Diagnosis not present

## 2018-02-17 DIAGNOSIS — Z0000 Encounter for general adult medical examination without abnormal findings: Secondary | ICD-10-CM | POA: Diagnosis not present

## 2018-02-17 DIAGNOSIS — Z23 Encounter for immunization: Secondary | ICD-10-CM | POA: Diagnosis not present

## 2018-02-20 DIAGNOSIS — L820 Inflamed seborrheic keratosis: Secondary | ICD-10-CM | POA: Diagnosis not present

## 2018-02-20 DIAGNOSIS — D2261 Melanocytic nevi of right upper limb, including shoulder: Secondary | ICD-10-CM | POA: Diagnosis not present

## 2018-04-06 ENCOUNTER — Other Ambulatory Visit: Payer: Self-pay | Admitting: Interventional Cardiology

## 2018-05-12 DIAGNOSIS — J3089 Other allergic rhinitis: Secondary | ICD-10-CM | POA: Diagnosis not present

## 2018-05-12 DIAGNOSIS — J069 Acute upper respiratory infection, unspecified: Secondary | ICD-10-CM | POA: Diagnosis not present

## 2018-05-12 DIAGNOSIS — R05 Cough: Secondary | ICD-10-CM | POA: Diagnosis not present

## 2018-05-26 ENCOUNTER — Encounter (HOSPITAL_COMMUNITY): Payer: Self-pay

## 2018-05-26 DIAGNOSIS — R109 Unspecified abdominal pain: Secondary | ICD-10-CM | POA: Diagnosis not present

## 2018-05-26 DIAGNOSIS — K56600 Partial intestinal obstruction, unspecified as to cause: Principal | ICD-10-CM | POA: Diagnosis present

## 2018-05-26 DIAGNOSIS — I480 Paroxysmal atrial fibrillation: Secondary | ICD-10-CM

## 2018-05-26 DIAGNOSIS — Z7982 Long term (current) use of aspirin: Secondary | ICD-10-CM | POA: Insufficient documentation

## 2018-05-26 DIAGNOSIS — Z820 Family history of epilepsy and other diseases of the nervous system: Secondary | ICD-10-CM

## 2018-05-26 DIAGNOSIS — R1084 Generalized abdominal pain: Secondary | ICD-10-CM | POA: Diagnosis not present

## 2018-05-26 DIAGNOSIS — E785 Hyperlipidemia, unspecified: Secondary | ICD-10-CM | POA: Diagnosis present

## 2018-05-26 DIAGNOSIS — I959 Hypotension, unspecified: Secondary | ICD-10-CM | POA: Diagnosis not present

## 2018-05-26 DIAGNOSIS — Z4682 Encounter for fitting and adjustment of non-vascular catheter: Secondary | ICD-10-CM | POA: Diagnosis not present

## 2018-05-26 DIAGNOSIS — Z8249 Family history of ischemic heart disease and other diseases of the circulatory system: Secondary | ICD-10-CM

## 2018-05-26 DIAGNOSIS — I2510 Atherosclerotic heart disease of native coronary artery without angina pectoris: Secondary | ICD-10-CM | POA: Insufficient documentation

## 2018-05-26 DIAGNOSIS — Z823 Family history of stroke: Secondary | ICD-10-CM

## 2018-05-26 DIAGNOSIS — Z882 Allergy status to sulfonamides status: Secondary | ICD-10-CM

## 2018-05-26 DIAGNOSIS — Z888 Allergy status to other drugs, medicaments and biological substances status: Secondary | ICD-10-CM

## 2018-05-26 DIAGNOSIS — Z90711 Acquired absence of uterus with remaining cervical stump: Secondary | ICD-10-CM

## 2018-05-26 DIAGNOSIS — R1110 Vomiting, unspecified: Secondary | ICD-10-CM | POA: Diagnosis not present

## 2018-05-26 DIAGNOSIS — R1111 Vomiting without nausea: Secondary | ICD-10-CM | POA: Diagnosis not present

## 2018-05-26 DIAGNOSIS — Z87891 Personal history of nicotine dependence: Secondary | ICD-10-CM | POA: Insufficient documentation

## 2018-05-26 DIAGNOSIS — K219 Gastro-esophageal reflux disease without esophagitis: Secondary | ICD-10-CM | POA: Diagnosis not present

## 2018-05-26 DIAGNOSIS — Z7902 Long term (current) use of antithrombotics/antiplatelets: Secondary | ICD-10-CM

## 2018-05-26 DIAGNOSIS — Z9841 Cataract extraction status, right eye: Secondary | ICD-10-CM

## 2018-05-26 DIAGNOSIS — K209 Esophagitis, unspecified: Secondary | ICD-10-CM | POA: Diagnosis not present

## 2018-05-26 DIAGNOSIS — K5730 Diverticulosis of large intestine without perforation or abscess without bleeding: Secondary | ICD-10-CM | POA: Diagnosis not present

## 2018-05-26 DIAGNOSIS — I10 Essential (primary) hypertension: Secondary | ICD-10-CM | POA: Diagnosis not present

## 2018-05-26 DIAGNOSIS — D631 Anemia in chronic kidney disease: Secondary | ICD-10-CM | POA: Diagnosis present

## 2018-05-26 DIAGNOSIS — N183 Chronic kidney disease, stage 3 (moderate): Secondary | ICD-10-CM | POA: Diagnosis present

## 2018-05-26 DIAGNOSIS — K56609 Unspecified intestinal obstruction, unspecified as to partial versus complete obstruction: Secondary | ICD-10-CM | POA: Diagnosis not present

## 2018-05-26 DIAGNOSIS — Z955 Presence of coronary angioplasty implant and graft: Secondary | ICD-10-CM

## 2018-05-26 DIAGNOSIS — D72829 Elevated white blood cell count, unspecified: Secondary | ICD-10-CM | POA: Diagnosis not present

## 2018-05-26 DIAGNOSIS — E876 Hypokalemia: Secondary | ICD-10-CM | POA: Diagnosis not present

## 2018-05-26 DIAGNOSIS — Z79899 Other long term (current) drug therapy: Secondary | ICD-10-CM

## 2018-05-26 DIAGNOSIS — Z9842 Cataract extraction status, left eye: Secondary | ICD-10-CM

## 2018-05-26 DIAGNOSIS — Z961 Presence of intraocular lens: Secondary | ICD-10-CM | POA: Diagnosis present

## 2018-05-26 DIAGNOSIS — M8580 Other specified disorders of bone density and structure, unspecified site: Secondary | ICD-10-CM | POA: Diagnosis present

## 2018-05-26 DIAGNOSIS — I25119 Atherosclerotic heart disease of native coronary artery with unspecified angina pectoris: Secondary | ICD-10-CM | POA: Diagnosis present

## 2018-05-26 DIAGNOSIS — Z9049 Acquired absence of other specified parts of digestive tract: Secondary | ICD-10-CM

## 2018-05-26 DIAGNOSIS — I129 Hypertensive chronic kidney disease with stage 1 through stage 4 chronic kidney disease, or unspecified chronic kidney disease: Secondary | ICD-10-CM | POA: Diagnosis present

## 2018-05-26 DIAGNOSIS — R1013 Epigastric pain: Secondary | ICD-10-CM | POA: Diagnosis not present

## 2018-05-26 DIAGNOSIS — E7800 Pure hypercholesterolemia, unspecified: Secondary | ICD-10-CM | POA: Diagnosis present

## 2018-05-26 DIAGNOSIS — Z880 Allergy status to penicillin: Secondary | ICD-10-CM

## 2018-05-26 DIAGNOSIS — M5136 Other intervertebral disc degeneration, lumbar region: Secondary | ICD-10-CM | POA: Diagnosis present

## 2018-05-26 DIAGNOSIS — R52 Pain, unspecified: Secondary | ICD-10-CM | POA: Diagnosis not present

## 2018-05-26 NOTE — ED Triage Notes (Signed)
Pt ate food that was two weeks old and then started having abdominal pain, pt complains of vomiting and nausea for one hour

## 2018-05-26 NOTE — ED Triage Notes (Signed)
EMS gave 4mg  zofran

## 2018-05-27 ENCOUNTER — Telehealth: Payer: Self-pay | Admitting: Interventional Cardiology

## 2018-05-27 ENCOUNTER — Emergency Department (HOSPITAL_COMMUNITY)
Admission: EM | Admit: 2018-05-27 | Discharge: 2018-05-27 | Disposition: A | Discharge status: Home / Self Care | Payer: Medicare Other | Source: Home / Self Care | Attending: Emergency Medicine | Admitting: Emergency Medicine

## 2018-05-27 ENCOUNTER — Emergency Department (HOSPITAL_COMMUNITY): Payer: Medicare Other

## 2018-05-27 DIAGNOSIS — K209 Esophagitis, unspecified without bleeding: Secondary | ICD-10-CM

## 2018-05-27 DIAGNOSIS — R109 Unspecified abdominal pain: Secondary | ICD-10-CM | POA: Diagnosis not present

## 2018-05-27 DIAGNOSIS — R1110 Vomiting, unspecified: Secondary | ICD-10-CM | POA: Diagnosis not present

## 2018-05-27 LAB — CBC WITH DIFFERENTIAL/PLATELET
Abs Immature Granulocytes: 0.04 10*3/uL (ref 0.00–0.07)
BASOS ABS: 0 10*3/uL (ref 0.0–0.1)
BASOS PCT: 0 %
Eosinophils Absolute: 0 10*3/uL (ref 0.0–0.5)
Eosinophils Relative: 0 %
HCT: 41.2 % (ref 36.0–46.0)
Hemoglobin: 13.2 g/dL (ref 12.0–15.0)
Immature Granulocytes: 0 %
Lymphocytes Relative: 15 %
Lymphs Abs: 1.7 10*3/uL (ref 0.7–4.0)
MCH: 31 pg (ref 26.0–34.0)
MCHC: 32 g/dL (ref 30.0–36.0)
MCV: 96.7 fL (ref 80.0–100.0)
Monocytes Absolute: 0.4 10*3/uL (ref 0.1–1.0)
Monocytes Relative: 4 %
NEUTROS PCT: 81 %
NRBC: 0 % (ref 0.0–0.2)
Neutro Abs: 8.9 10*3/uL — ABNORMAL HIGH (ref 1.7–7.7)
PLATELETS: 231 10*3/uL (ref 150–400)
RBC: 4.26 MIL/uL (ref 3.87–5.11)
RDW: 13.5 % (ref 11.5–15.5)
WBC: 11.1 10*3/uL — AB (ref 4.0–10.5)

## 2018-05-27 LAB — COMPREHENSIVE METABOLIC PANEL
ALBUMIN: 4.1 g/dL (ref 3.5–5.0)
ALT: 32 U/L (ref 0–44)
AST: 28 U/L (ref 15–41)
Alkaline Phosphatase: 70 U/L (ref 38–126)
Anion gap: 11 (ref 5–15)
BUN: 16 mg/dL (ref 8–23)
CHLORIDE: 100 mmol/L (ref 98–111)
CO2: 28 mmol/L (ref 22–32)
Calcium: 9.4 mg/dL (ref 8.9–10.3)
Creatinine, Ser: 0.84 mg/dL (ref 0.44–1.00)
GFR calc Af Amer: >60 mL/min (ref >60)
GFR calc non Af Amer: >60 mL/min (ref >60)
GLUCOSE: 130 mg/dL — AB (ref 70–99)
POTASSIUM: 3.9 mmol/L (ref 3.5–5.1)
Sodium: 139 mmol/L (ref 135–145)
Total Bilirubin: 0.5 mg/dL (ref 0.3–1.2)
Total Protein: 6.6 g/dL (ref 6.5–8.1)

## 2018-05-27 LAB — LIPASE, BLOOD: LIPASE: 29 U/L (ref 11–51)

## 2018-05-27 MED ORDER — PANTOPRAZOLE SODIUM 40 MG IV SOLR
40.0000 mg | Freq: Once | INTRAVENOUS | Status: AC
  Administered 2018-05-27: 40 mg via INTRAVENOUS
  Filled 2018-05-27: qty 40

## 2018-05-27 MED ORDER — FAMOTIDINE 20 MG PO TABS: 20.0000 mg | ORAL_TABLET | Freq: Two times a day (BID) | ORAL | 0 refills | Status: DC

## 2018-05-27 MED ORDER — FENTANYL CITRATE (PF) 100 MCG/2ML IJ SOLN
50.0000 ug | Freq: Once | INTRAMUSCULAR | Status: DC
  Filled 2018-05-27: qty 2

## 2018-05-27 MED ORDER — SODIUM CHLORIDE 0.9 % IV SOLN: INTRAVENOUS | Status: DC

## 2018-05-27 MED ORDER — MORPHINE SULFATE (PF) 4 MG/ML IV SOLN
4.0000 mg | Freq: Once | INTRAVENOUS | Status: AC
  Administered 2018-05-27: 2 mg via INTRAVENOUS
  Filled 2018-05-27: qty 1

## 2018-05-27 MED ORDER — METOCLOPRAMIDE HCL 5 MG/ML IJ SOLN
2.5000 mg | Freq: Once | INTRAMUSCULAR | Status: DC
  Filled 2018-05-27: qty 2

## 2018-05-27 MED ORDER — SUCRALFATE 1 G PO TABS: 1.0000 g | ORAL_TABLET | Freq: Four times a day (QID) | ORAL | 0 refills | Status: DC

## 2018-05-27 MED ORDER — SODIUM CHLORIDE 0.9 % IV BOLUS
500.0000 mL | Freq: Once | INTRAVENOUS | Status: AC
  Administered 2018-05-27: 500 mL via INTRAVENOUS

## 2018-05-27 MED ORDER — ONDANSETRON HCL 4 MG/2ML IJ SOLN
4.0000 mg | Freq: Once | INTRAMUSCULAR | Status: AC
Start: 2018-05-27 — End: 2018-05-27
  Administered 2018-05-27: 4 mg via INTRAVENOUS
  Filled 2018-05-27: qty 2

## 2018-05-27 NOTE — Telephone Encounter (Signed)
New Message        Patient is calling concerning her heart medication and her trip to the ER. Pls call and advise.

## 2018-05-27 NOTE — Telephone Encounter (Signed)
Spoke with pt and she states she was seen in ER earlier for N/V that won't stop.  Pt states ER MD felt she likely had food poisoning from eating old pimento cheese.  Since coming home she has continued vomiting.  She is worried about keeping her cardiac meds down.  Advised pt to reach out to PCP for anti nausea medication to see if that will help. Pt states they have reached out to them and just waiting to hear back.  Advised that was the best option in regards to the N/V.  Pt appreciative for call.

## 2018-05-27 NOTE — ED Provider Notes (Signed)
[facility] DEPT Provider Note   CSN: [id number] Arrival date & time: 05/26/18  2247     History   Chief Complaint Chief Complaint  Patient presents with  . Abdominal Pain    HPI Sherry Barnett is a 84 y.o. female.  84 year old female presents after sudden onset of epigastric pain that began after she ate pimento cheese.  Patient has had multiple episodes of nonbilious emesis.  Pain is burning and goes to her back.  No anginal type symptoms.  She is also developed watery diarrhea as well.  She is was over 42 week old and was homemade.  No treatments used prior to arrival     Past Medical History:  Diagnosis Date  . A-fib ([place])   . Anemia   . Arthritis    "a little in my hands" (08/13/2017)  . Blepharitis    chronic  . CAD (coronary artery disease), native coronary artery    10/18 PCI/DES to p/mLAD, normal EF  . Cardiovascular risk factor    23%  . Degenerative joint disease (DJD) of lumbar spine   . Diverticulosis   . Endometriosis   . Fibroids   . GERD (gastroesophageal reflux disease)   . Hematuria, microscopic F120055  . Hemorrhoids   . Hypercholesterolemia    10 years  . Osteopenia 03/2016   start alendrodate  . PONV (postoperative nausea and vomiting)   . Renal disease    stage2/stage 3    Patient Active Problem List   Diagnosis Date Noted  . Coronary artery disease involving native coronary artery of native heart with angina pectoris ([place])   . Syncope 08/13/2017  . Angina pectoris ([place]) 03/14/2017  . Hyperlipidemia LDL goal <70 03/14/2017  . Paroxysmal atrial fibrillation ([place]) 03/14/2017  . H/O class III angina pectoris 03/14/2017    Past Surgical History:  Procedure Laterality Date  . APPENDECTOMY    . CARDIAC CATHETERIZATION  08/13/2017  . CATARACT EXTRACTION W/ INTRAOCULAR LENS  IMPLANT, BILATERAL Bilateral 2001 -  11/2016   "left-right"  . CORONARY ANGIOPLASTY WITH STENT PLACEMENT    . CORONARY STENT INTERVENTION  N/A 03/14/2017   Procedure: CORONARY STENT INTERVENTION;  Surgeon: Belva Crome, MD;  Location: [facility];  Service: Cardiovascular;  Laterality: N/A;  . DILATION AND CURETTAGE OF UTERUS    . LEFT HEART CATH AND CORONARY ANGIOGRAPHY N/A 03/14/2017   Procedure: LEFT HEART CATH AND CORONARY ANGIOGRAPHY;  Surgeon: Belva Crome, MD;  Location: [facility];  Service: Cardiovascular;  Laterality: N/A;  . LEFT HEART CATH AND CORONARY ANGIOGRAPHY N/A 08/13/2017   Procedure: LEFT HEART CATH AND CORONARY ANGIOGRAPHY;  Surgeon: Belva Crome, MD;  Location: [facility];  Service: Cardiovascular;  Laterality: N/A;  . OVARIAN CYST SURGERY Bilateral   . SALPINGOOPHORECTOMY Bilateral    endometriosis; "still have a piece of my left ovary"  . TONSILLECTOMY       OB History   No obstetric history on file.      Home Medications    Prior to Admission medications   Medication Sig Start Date End Date Taking? Authorizing Provider  alendronate (FOSAMAX) 70 MG tablet Take 70 mg by mouth every Saturday. Take with a full glass of water on an empty stomach.     [provider]  Artificial Tear Ointment (ARTIFICIAL TEARS) ointment Place 1 drop into both eyes 4 (four) times daily as needed (dry eyes). Refresh Tears for dry eyes.  [provider]  aspirin 81 MG EC tablet Take 81 mg by mouth daily with breakfast. Swallow whole.    [provider]  atorvastatin (LIPITOR) 80 MG tablet TAKE 1 TABLET BY MOUTH EVERY DAY AT 6:00 PM 06/03/17   Belva Crome, MD  Azelaic Acid (FINACEA) 15 % cream Apply 1 application topically 2 (two) times daily. After skin is thoroughly washed and patted dry, gently but thoroughly massage a thin film of azelaic acid cream into the affected area twice daily, in the morning and evening.     [provider]  calcium carbonate (TUMS EX) 750 MG chewable tablet Chew 1-2 tablets by mouth 2 (two) times daily as needed (for  hearburn/indigestion.).     [provider]  cholecalciferol (VITAMIN D) 1000 units tablet Take 1,000 Units by mouth daily.    [provider]  clopidogrel (PLAVIX) 75 MG tablet TAKE 1 TABLET BY MOUTH DAILY 04/08/18   Belva Crome, MD  isosorbide mononitrate (IMDUR) 60 MG 24 hr tablet Take 1 tablet (60 mg total) by mouth daily. 08/02/17 07/28/18  Belva Crome, MD  metoprolol succinate (TOPROL-XL) 50 MG 24 hr tablet Take 1 tablet (50 mg total) by mouth daily. Take with or immediately following a meal. 07/23/17 07/18/18  Belva Crome, MD  nitroGLYCERIN (NITROSTAT) 0.4 MG SL tablet Place 1 tablet (0.4 mg total) under the tongue every 5 (five) minutes as needed for chest pain. 02/26/17 08/02/18  Belva Crome, MD  pantoprazole (PROTONIX) 40 MG tablet Take 40 mg by mouth daily as needed (acid reflux).    [provider]  tobramycin-dexamethasone [facility]) ophthalmic ointment Place 1 application into both eyes See admin instructions. Tuesday AND Saturday patient does not use. Will apply at night on all other nights.    [provider]    Family History Family History  Problem Relation Age of Onset  . Heart attack Mother   . Alzheimer's disease Father   . Hypertension Father   . Stroke Father   . Other Sister 50       MVA    Social History Social History   Tobacco Use  . Smoking status: Former Smoker    Packs/day: 0.10    Years: 5.00    Pack years: 0.50    Types: Cigarettes    Last attempt to quit: 02/06/1969    Years since quitting: 49.3  . Smokeless tobacco: Never Used  Substance Use Topics  . Alcohol use: No  . Drug use: No     Allergies   Sulfa antibiotics; Novocain [procaine]; and Penicillins   Review of Systems Review of Systems  All other systems reviewed and are negative.    Physical Exam Updated Vital Signs BP (!) 141/60 (BP Location: Right Arm)   Pulse (!) 58   Temp (!) 97.4 F (36.3 C) (Oral)   Resp (!) 25   Ht 1.588 m  (5' 2.5")   Wt 62.1 kg   SpO2 100%   BMI 24.66 kg/m   Physical Exam Vitals signs and nursing note reviewed.  Constitutional:      General: She is not in acute distress.    Appearance: Normal appearance. She is well-developed. She is not toxic-appearing.  HENT:     Head: Normocephalic and atraumatic.  Eyes:     General: Lids are normal.     Conjunctiva/sclera: Conjunctivae normal.     Pupils: Pupils are equal, round, and reactive to light.  Neck:  Musculoskeletal: Normal range of motion and neck supple.     Thyroid: No thyroid mass.     Trachea: No tracheal deviation.  Cardiovascular:     Rate and Rhythm: Normal rate and regular rhythm.     Heart sounds: Normal heart sounds. No murmur. No gallop.   Pulmonary:     Effort: Pulmonary effort is normal. No respiratory distress.     Breath sounds: Normal breath sounds. No stridor. No decreased breath sounds, wheezing, rhonchi or rales.  Abdominal:     General: Bowel sounds are normal. There is no distension.     Palpations: Abdomen is soft.     Tenderness: There is no abdominal tenderness. There is no rebound.    Musculoskeletal: Normal range of motion.        General: No tenderness.  Skin:    General: Skin is warm and dry.     Findings: No abrasion or rash.  Neurological:     Mental Status: She is alert and oriented to person, place, and time.     GCS: GCS eye subscore is 4. GCS verbal subscore is 5. GCS motor subscore is 6.     Cranial Nerves: No cranial nerve deficit.     Sensory: No sensory deficit.  Psychiatric:        Speech: Speech normal.        Behavior: Behavior normal.      ED Treatments / Results  Labs (all labs ordered are listed, but only abnormal results are displayed) Labs Reviewed  CBC WITH DIFFERENTIAL/PLATELET  COMPREHENSIVE METABOLIC PANEL  LIPASE, BLOOD    EKG None  Radiology No results found.  Procedures Procedures (including critical care time)  Medications Ordered in  ED Medications  sodium chloride 0.9 % bolus 500 mL (has no administration in time range)  0.9 %  sodium chloride infusion (has no administration in time range)  pantoprazole (PROTONIX) injection 40 mg (has no administration in time range)  ondansetron (ZOFRAN) injection 4 mg (has no administration in time range)  morphine 4 MG/ML injection 4 mg (has no administration in time range)     Initial Impression / Assessment and Plan / ED Course  I have reviewed the triage vital signs and the nursing notes.  Pertinent labs & imaging results that were available during my care of the patient were reviewed by me and considered in my medical decision making (see chart for details).     Patient's acute abdominal series without signs of obstruction.  No evidence of pancreatitis on patient's blood work.  Patient treated for likely GERD symptoms.  Patient be discharged with Carafate and a PPI Final Clinical Impressions(s) / ED Diagnoses   Final diagnoses:  None    ED Discharge Orders    None       Lacretia Leigh, MD 05/27/18 [phone]

## 2018-05-28 ENCOUNTER — Inpatient Hospital Stay (HOSPITAL_COMMUNITY)
Admission: EM | Admit: 2018-05-28 | Discharge: 2018-06-01 | DRG: 390 | Disposition: A | Discharge status: Home / Self Care | Payer: Medicare Other | Attending: Internal Medicine | Admitting: Internal Medicine

## 2018-05-28 ENCOUNTER — Encounter (HOSPITAL_COMMUNITY): Payer: Self-pay

## 2018-05-28 ENCOUNTER — Emergency Department (HOSPITAL_COMMUNITY): Payer: Medicare Other

## 2018-05-28 ENCOUNTER — Other Ambulatory Visit: Payer: Self-pay

## 2018-05-28 DIAGNOSIS — D72829 Elevated white blood cell count, unspecified: Secondary | ICD-10-CM | POA: Diagnosis not present

## 2018-05-28 DIAGNOSIS — K56609 Unspecified intestinal obstruction, unspecified as to partial versus complete obstruction: Secondary | ICD-10-CM

## 2018-05-28 DIAGNOSIS — I25119 Atherosclerotic heart disease of native coronary artery with unspecified angina pectoris: Secondary | ICD-10-CM

## 2018-05-28 DIAGNOSIS — K219 Gastro-esophageal reflux disease without esophagitis: Secondary | ICD-10-CM | POA: Diagnosis present

## 2018-05-28 DIAGNOSIS — K5730 Diverticulosis of large intestine without perforation or abscess without bleeding: Secondary | ICD-10-CM | POA: Diagnosis not present

## 2018-05-28 DIAGNOSIS — I480 Paroxysmal atrial fibrillation: Secondary | ICD-10-CM | POA: Diagnosis not present

## 2018-05-28 DIAGNOSIS — Z4682 Encounter for fitting and adjustment of non-vascular catheter: Secondary | ICD-10-CM | POA: Diagnosis not present

## 2018-05-28 DIAGNOSIS — K56600 Partial intestinal obstruction, unspecified as to cause: Secondary | ICD-10-CM | POA: Diagnosis present

## 2018-05-28 DIAGNOSIS — I2510 Atherosclerotic heart disease of native coronary artery without angina pectoris: Secondary | ICD-10-CM | POA: Diagnosis present

## 2018-05-28 DIAGNOSIS — R1013 Epigastric pain: Secondary | ICD-10-CM | POA: Diagnosis not present

## 2018-05-28 LAB — CBC
HEMATOCRIT: 41.6 % (ref 36.0–46.0)
HEMOGLOBIN: 13.3 g/dL (ref 12.0–15.0)
MCH: 31.1 pg (ref 26.0–34.0)
MCHC: 32 g/dL (ref 30.0–36.0)
MCV: 97.4 fL (ref 80.0–100.0)
Platelets: 216 10*3/uL (ref 150–400)
RBC: 4.27 MIL/uL (ref 3.87–5.11)
RDW: 13.6 % (ref 11.5–15.5)
WBC: 10.8 10*3/uL — ABNORMAL HIGH (ref 4.0–10.5)
nRBC: 0 % (ref 0.0–0.2)

## 2018-05-28 LAB — URINALYSIS, ROUTINE W REFLEX MICROSCOPIC
Bacteria, UA: NONE SEEN
Bilirubin Urine: NEGATIVE
Glucose, UA: NEGATIVE mg/dL
Ketones, ur: 80 mg/dL — AB
Nitrite: NEGATIVE
Protein, ur: NEGATIVE mg/dL
SPECIFIC GRAVITY, URINE: 1.018 (ref 1.005–1.030)
pH: 6 (ref 5.0–8.0)

## 2018-05-28 LAB — LIPASE, BLOOD: Lipase: 27 U/L (ref 11–51)

## 2018-05-28 LAB — COMPREHENSIVE METABOLIC PANEL
ALT: 27 U/L (ref 0–44)
AST: 28 U/L (ref 15–41)
Albumin: 4 g/dL (ref 3.5–5.0)
Alkaline Phosphatase: 61 U/L (ref 38–126)
Anion gap: 11 (ref 5–15)
BUN: 16 mg/dL (ref 8–23)
CO2: 26 mmol/L (ref 22–32)
Calcium: 8.9 mg/dL (ref 8.9–10.3)
Chloride: 99 mmol/L (ref 98–111)
Creatinine, Ser: 0.91 mg/dL (ref 0.44–1.00)
GFR calc Af Amer: >60 mL/min (ref >60)
GFR calc non Af Amer: 58 mL/min — ABNORMAL LOW (ref >60)
Glucose, Bld: 109 mg/dL — ABNORMAL HIGH (ref 70–99)
Potassium: 3.7 mmol/L (ref 3.5–5.1)
Sodium: 136 mmol/L (ref 135–145)
Total Bilirubin: 1.1 mg/dL (ref 0.3–1.2)
Total Protein: 6.3 g/dL — ABNORMAL LOW (ref 6.5–8.1)

## 2018-05-28 LAB — I-STAT TROPONIN, ED: Troponin i, poc: 0.02 ng/mL (ref 0.00–0.08)

## 2018-05-28 MED ORDER — DIATRIZOATE MEGLUMINE & SODIUM 66-10 % PO SOLN
90.0000 mL | Freq: Once | ORAL | Status: AC
  Administered 2018-05-28: 90 mL via NASOGASTRIC
  Filled 2018-05-28: qty 90

## 2018-05-28 MED ORDER — LIDOCAINE HCL URETHRAL/MUCOSAL 2 % EX GEL
1.0000 "application " | Freq: Once | CUTANEOUS | Status: AC
  Administered 2018-05-28: 1 via TOPICAL
  Filled 2018-05-28: qty 5

## 2018-05-28 MED ORDER — PANTOPRAZOLE SODIUM 40 MG IV SOLR
40.0000 mg | Freq: Two times a day (BID) | INTRAVENOUS | Status: DC
  Administered 2018-05-28 – 2018-06-01 (×8): 40 mg via INTRAVENOUS
  Filled 2018-05-28 (×8): qty 40

## 2018-05-28 MED ORDER — IOPAMIDOL (ISOVUE-300) INJECTION 61%
100.0000 mL | Freq: Once | INTRAVENOUS | Status: AC | PRN
  Administered 2018-05-28: 100 mL via INTRAVENOUS

## 2018-05-28 MED ORDER — ONDANSETRON HCL 4 MG/2ML IJ SOLN
4.0000 mg | Freq: Once | INTRAMUSCULAR | Status: AC
  Administered 2018-05-28: 4 mg via INTRAVENOUS
  Filled 2018-05-28: qty 2

## 2018-05-28 MED ORDER — NITROGLYCERIN 0.4 MG SL SUBL: 0.4000 mg | SUBLINGUAL_TABLET | SUBLINGUAL | Status: DC | PRN

## 2018-05-28 MED ORDER — HYDROMORPHONE HCL 1 MG/ML IJ SOLN
0.2500 mg | INTRAMUSCULAR | Status: DC | PRN
  Administered 2018-05-29 – 2018-05-30 (×4): 0.5 mg via INTRAVENOUS
  Filled 2018-05-28 (×4): qty 0.5

## 2018-05-28 MED ORDER — ENOXAPARIN SODIUM 40 MG/0.4ML ~~LOC~~ SOLN
40.0000 mg | Freq: Every day | SUBCUTANEOUS | Status: DC
  Administered 2018-05-28 – 2018-05-31 (×4): 40 mg via SUBCUTANEOUS
  Filled 2018-05-28 (×4): qty 0.4

## 2018-05-28 MED ORDER — FENTANYL CITRATE (PF) 100 MCG/2ML IJ SOLN
50.0000 ug | Freq: Once | INTRAMUSCULAR | Status: AC
  Administered 2018-05-28: 50 ug via INTRAVENOUS
  Filled 2018-05-28: qty 2

## 2018-05-28 MED ORDER — ARTIFICIAL TEARS OPHTHALMIC OINT
1.0000 "application " | TOPICAL_OINTMENT | Freq: Four times a day (QID) | OPHTHALMIC | Status: DC | PRN
  Filled 2018-05-28: qty 3.5

## 2018-05-28 MED ORDER — SODIUM CHLORIDE 0.9 % IV SOLN
INTRAVENOUS | Status: DC
  Administered 2018-05-28: 23:00:00 via INTRAVENOUS

## 2018-05-28 MED ORDER — SODIUM CHLORIDE 0.9 % IV BOLUS
1000.0000 mL | Freq: Once | INTRAVENOUS | Status: AC
  Administered 2018-05-28: 1000 mL via INTRAVENOUS

## 2018-05-28 MED ORDER — TOBRAMYCIN-DEXAMETHASONE 0.3-0.1 % OP OINT
1.0000 "application " | TOPICAL_OINTMENT | OPHTHALMIC | Status: DC
  Administered 2018-05-29 – 2018-05-31 (×3): 1 via OPHTHALMIC
  Filled 2018-05-28: qty 3.5

## 2018-05-28 MED ORDER — HYDROMORPHONE HCL 1 MG/ML IJ SOLN
0.5000 mg | Freq: Once | INTRAMUSCULAR | Status: AC
  Administered 2018-05-28: 0.5 mg via INTRAVENOUS
  Filled 2018-05-28: qty 1

## 2018-05-28 MED ORDER — SODIUM CHLORIDE 0.9% FLUSH
3.0000 mL | Freq: Two times a day (BID) | INTRAVENOUS | Status: DC
  Administered 2018-05-28 – 2018-06-01 (×5): 3 mL via INTRAVENOUS

## 2018-05-28 NOTE — ED Notes (Signed)
Patient aware urine sample needed

## 2018-05-28 NOTE — ED Triage Notes (Signed)
Pt arrives via POV from home. Pt was seen Monday for the same. Pt reports that she was dc'ed with an antiemetic and Pepcid. Pt reports worsening N/V, and abd pain. Pt reports she is now having some abd distension. Pt denies fevers at home. Family concerned for dehydration due to little to no oral intake.

## 2018-05-28 NOTE — H&P (Signed)
History and Physical    Sherry Barnett:[id number] DOB: 18-Jun-1932 DOA: 05/28/2018  Referring MD/NP/PA: Sherwood Gambler, MD PCP: Sherry Manes, MD  Patient coming from: Home via POV  Chief Complaint: Nausea and vomiting  I have personally briefly reviewed patient's old medical records in [place]   HPI: Sherry Barnett is a 84 y.o. female with medical history significant of CAD, A. fib, HLD, anemia, diverticulosis, and CKD stage II/III; who presents with complaints of nausea and vomiting since 5 PM days ago.  Patient reports having several episodes of nonbloody emesis when she was unable to keep anything down.  Associated symptoms included complaints right upper quadrant discomfort.  She has been taken to the emergency department at that time.  Work-up which included a x-rays of the abdomen showed no clear signs of obstruction.  Patient improved and was discharged home with Pepcid and Zofran for nausea.  However, after getting home patient reports that her abdomen became more distended and the pain became more generalized.  She tried taking Pepcid, Zofran, and sucralfate which was given to her by her primary care provider without relief of symptoms.  She came back to the emergency department as her pain was coming more severe and noted that she was not able to pass flatus.  Denies having any chest pain, shortness of breath, fever, dysuria, or leg swelling.   ED Course: Evaluation to the emergency department patient is noted to be afebrile, pulse 54-61, respirations 11-22, and all other vital signs maintained.  Labs revealed downtrending WBC 10.8( previously 11.1 on 12/17).  Urinalysis showed large hemoglobin, 80 ketones, trace leukocytes, and no bacteria.  CT scan of the abdomen and pelvis was obtained which showed signs of partial small bowel obstruction with transition point in the anterior pelvis likely related to adhesions.  Dr. Dema Barnett of general surgery was consulted and recommended  placement of NG tube.  Patient was given 1 L normal saline IV fluids, Zofran, fentanyl, and then Dilaudid for pain.   Review of Systems  Constitutional: Positive for malaise/fatigue. Negative for chills and fever.  HENT: Negative for congestion and ear pain.   Eyes: Negative for pain and discharge.  Respiratory: Negative for cough and shortness of breath.   Cardiovascular: Negative for chest pain and leg swelling.  Gastrointestinal: Positive for abdominal pain, constipation, nausea and vomiting. Negative for blood in stool.  Genitourinary: Negative for dysuria and frequency.  Musculoskeletal: Negative for falls and neck pain.  Skin: Negative for itching and rash.  Neurological: Negative for focal weakness and loss of consciousness.  Endo/Heme/Allergies: Negative for polydipsia. Bruises/bleeds easily.  Psychiatric/Behavioral: Negative for memory loss and substance abuse.    Past Medical History:  Diagnosis Date  . A-fib ([place])   . Anemia   . Arthritis    "a little in my hands" (08/13/2017)  . Blepharitis    chronic  . CAD (coronary artery disease), native coronary artery    10/18 PCI/DES to p/mLAD, normal EF  . Cardiovascular risk factor    23%  . Degenerative joint disease (DJD) of lumbar spine   . Diverticulosis   . Endometriosis   . Fibroids   . GERD (gastroesophageal reflux disease)   . Hematuria, microscopic [id number]  . Hemorrhoids   . Hypercholesterolemia    10 years  . Osteopenia 03/2016   start alendrodate  . PONV (postoperative nausea and vomiting)   . Renal disease    stage2/stage 3    Past Surgical History:  Procedure  Laterality Date  . APPENDECTOMY    . CARDIAC CATHETERIZATION  08/13/2017  . CATARACT EXTRACTION W/ INTRAOCULAR LENS  IMPLANT, BILATERAL Bilateral 2001 -  11/2016   "left-right"  . CORONARY ANGIOPLASTY WITH STENT PLACEMENT    . CORONARY STENT INTERVENTION N/A 03/14/2017   Procedure: CORONARY STENT INTERVENTION;  Surgeon: Belva Crome, MD;   Location: [facility];  Service: Cardiovascular;  Laterality: N/A;  . DILATION AND CURETTAGE OF UTERUS    . LEFT HEART CATH AND CORONARY ANGIOGRAPHY N/A 03/14/2017   Procedure: LEFT HEART CATH AND CORONARY ANGIOGRAPHY;  Surgeon: Belva Crome, MD;  Location: [facility];  Service: Cardiovascular;  Laterality: N/A;  . LEFT HEART CATH AND CORONARY ANGIOGRAPHY N/A 08/13/2017   Procedure: LEFT HEART CATH AND CORONARY ANGIOGRAPHY;  Surgeon: Belva Crome, MD;  Location: [facility];  Service: Cardiovascular;  Laterality: N/A;  . OVARIAN CYST SURGERY Bilateral   . SALPINGOOPHORECTOMY Bilateral    endometriosis; "still have a piece of my left ovary"  . TONSILLECTOMY       reports that she quit smoking about 49 years ago. Her smoking use included cigarettes. She has a 0.50 pack-year smoking history. She has never used smokeless tobacco. She reports that she does not drink alcohol or use drugs.  Allergies  Allergen Reactions  . Sulfa Antibiotics Other (See Comments)    HEMATURIA  . Novocain [Procaine] Palpitations  . Penicillins Rash    Has patient had a PCN reaction causing immediate rash, facial/tongue/throat swelling, SOB or lightheadedness with hypotension: Yes Has patient had a PCN reaction causing severe rash involving mucus membranes or skin necrosis: Unknown Has patient had a PCN reaction that required hospitalization: No Has patient had a PCN reaction occurring within the last 10 years: No If all of the above answers are "NO", then may proceed with Cephalosporin use.     Family History  Problem Relation Age of Onset  . Heart attack Mother   . Alzheimer's disease Father   . Hypertension Father   . Stroke Father   . Other Sister 86       MVA    Prior to Admission medications   Medication Sig Start Date End Date Taking? Authorizing Provider  Artificial Tear Ointment (ARTIFICIAL TEARS) ointment Place 1 drop into both eyes 4 (four) times daily as needed (dry  eyes). Refresh Tears for dry eyes.   Yes [provider]  aspirin 81 MG EC tablet Take 81 mg by mouth daily with breakfast. Swallow whole.   Yes [provider]  ondansetron (ZOFRAN-ODT) 8 MG disintegrating tablet Take 8 mg by mouth every 12 (twelve) hours as needed for nausea or vomiting.   Yes [provider]  tobramycin-dexamethasone (TOBRADEX) ophthalmic ointment Place 1 application into both eyes See admin instructions. Tuesday AND Saturday patient does not use. Will apply at night on all other nights.   Yes [provider]  alendronate (FOSAMAX) 70 MG tablet Take 70 mg by mouth every Saturday. Take with a full glass of water on an empty stomach.     [provider]  atorvastatin (LIPITOR) 80 MG tablet TAKE 1 TABLET BY MOUTH EVERY DAY AT 6:00 PM Patient taking differently: Take 80 mg by mouth every evening.  06/03/17   Belva Crome, MD  Azelaic Acid (FINACEA) 15 % cream Apply 1 application topically 2 (two) times daily. After skin is thoroughly washed and patted dry, gently but thoroughly massage a thin film  of azelaic acid cream into the affected area twice daily, in the morning and evening.     [provider]  calcium carbonate (TUMS EX) 750 MG chewable tablet Chew 1-2 tablets by mouth 2 (two) times daily as needed (for hearburn/indigestion.).     [provider]  cholecalciferol (VITAMIN D) 1000 units tablet Take 1,000 Units by mouth daily.    [provider]  clopidogrel (PLAVIX) 75 MG tablet TAKE 1 TABLET BY MOUTH DAILY 04/08/18   Belva Crome, MD  famotidine (PEPCID) 20 MG tablet Take 1 tablet (20 mg total) by mouth 2 (two) times daily. 05/27/18   Lacretia Leigh, MD  isosorbide mononitrate (IMDUR) 60 MG 24 hr tablet Take 1 tablet (60 mg total) by mouth daily. 08/02/17 07/28/18  Belva Crome, MD  metoprolol succinate (TOPROL-XL) 50 MG 24 hr tablet Take 1 tablet (50 mg total) by mouth daily. Take with or immediately  following a meal. 07/23/17 07/18/18  Belva Crome, MD  nitroGLYCERIN (NITROSTAT) 0.4 MG SL tablet Place 1 tablet (0.4 mg total) under the tongue every 5 (five) minutes as needed for chest pain. 02/26/17 08/02/18  Belva Crome, MD  pantoprazole (PROTONIX) 40 MG tablet Take 40 mg by mouth daily as needed (acid reflux).    [provider]  sucralfate (CARAFATE) 1 g tablet Take 1 tablet (1 g total) by mouth 4 (four) times daily. 05/27/18   Lacretia Leigh, MD    Physical Exam:  Constitutional: Early female who appears in NAD, calm, comfortable Vitals:   05/28/18 1830 05/28/18 1842 05/28/18 1900 05/28/18 1930  BP: (!) 149/55 (!) 149/55 (!) 159/47 121/78  Pulse: 60 (!) 57 (!) 57 (!) 57  Resp: 12 13 13 11   Temp:      TempSrc:      SpO2: 98% 98% 99% 100%  Weight:       Eyes: PERRL, lids and conjunctivae normal ENMT: Mucous membranes are moist. Posterior pharynx clear of any exudate or lesions.nasogastric tube in place Neck: normal, supple, no masses, no thyromegaly Respiratory: clear to auscultation bilaterally, no wheezing, no crackles. Normal respiratory effort. No accessory muscle use.  Cardiovascular: Bradycardiac, no murmurs / rubs / gallops. No extremity edema. 2+ pedal pulses. No carotid bruits.  Abdomen: Mild abdominal distention with decreased bowel sounds. No masses palpated. No hepatosplenomegaly.  Musculoskeletal: no clubbing / cyanosis. No joint deformity upper and lower extremities. Good ROM, no contractures. Normal muscle tone.  Skin: no rashes, lesions, ulcers. No induration Neurologic: CN 2-12 grossly intact. Sensation intact, DTR normal. Strength 5/5 in all 4.  Psychiatric: Normal judgment and insight. Alert and oriented x 3. Normal mood.     Labs on Admission: I have personally reviewed following labs and imaging studies  CBC: Recent Labs  Lab 05/27/18 0258 05/28/18 1600  WBC 11.1* 10.8*  NEUTROABS 8.9*  --   HGB 13.2 13.3  HCT 41.2 41.6  MCV 96.7 97.4    PLT 231 938   Basic Metabolic Panel: Recent Labs  Lab 05/27/18 0258 05/28/18 1600  NA 139 136  K 3.9 3.7  CL 100 99  CO2 28 26  GLUCOSE 130* 109*  BUN 16 16  CREATININE 0.84 0.91  CALCIUM 9.4 8.9   GFR: Estimated Creatinine Clearance: 41.1 mL/min (by C-G formula based on SCr of 0.91 mg/dL). Liver Function Tests: Recent Labs  Lab 05/27/18 0258 05/28/18 1600  AST 28 28  ALT 32 27  ALKPHOS 70 61  BILITOT 0.5 1.1  PROT 6.6 6.3*  ALBUMIN 4.1 4.0   Recent Labs  Lab 05/27/18 0258 05/28/18 1600  LIPASE 29 27   No results for input(s): AMMONIA in the last 168 hours. Coagulation Profile: No results for input(s): INR, PROTIME in the last 168 hours. Cardiac Enzymes: No results for input(s): CKTOTAL, CKMB, CKMBINDEX, TROPONINI in the last 168 hours. BNP (last 3 results) No results for input(s): PROBNP in the last 8760 hours. HbA1C: No results for input(s): HGBA1C in the last 72 hours. CBG: No results for input(s): GLUCAP in the last 168 hours. Lipid Profile: No results for input(s): CHOL, HDL, LDLCALC, TRIG, CHOLHDL, LDLDIRECT in the last 72 hours. Thyroid Function Tests: No results for input(s): TSH, T4TOTAL, FREET4, T3FREE, THYROIDAB in the last 72 hours. Anemia Panel: No results for input(s): VITAMINB12, FOLATE, FERRITIN, TIBC, IRON, RETICCTPCT in the last 72 hours. Urine analysis:    Component Value Date/Time   COLORURINE YELLOW 05/28/2018 [place] 05/28/2018 1755   LABSPEC 1.018 05/28/2018 1755   PHURINE 6.0 05/28/2018 1755   GLUCOSEU NEGATIVE 05/28/2018 1755   HGBUR LARGE (A) 05/28/2018 [place] NEGATIVE 05/28/2018 1755   KETONESUR 80 (A) 05/28/2018 1755   PROTEINUR NEGATIVE 05/28/2018 1755   NITRITE NEGATIVE 05/28/2018 1755   LEUKOCYTESUR TRACE (A) 05/28/2018 1755   Sepsis Labs: No results found for this or any previous visit (from the past 240 hour(s)).   Radiological Exams on Admission: Ct Abdomen Pelvis W  Contrast  Result Date: 05/28/2018 CLINICAL DATA:  Abdominal pain, distention, and nausea and vomiting for several days. Previous appendectomy. EXAM: CT ABDOMEN AND PELVIS WITH CONTRAST TECHNIQUE: Multidetector CT imaging of the abdomen and pelvis was performed using the standard protocol following bolus administration of intravenous contrast. CONTRAST:  164mL ISOVUE-300 IOPAMIDOL (ISOVUE-300) INJECTION 61% COMPARISON:  None. FINDINGS: Lower Chest: No acute findings. Hepatobiliary: No hepatic masses identified. Probable tiny sub-cm cyst in right hepatic lobe. Gallbladder is unremarkable. No evidence of biliary ductal dilatation. Pancreas:  No mass or inflammatory changes. Spleen: Within normal limits in size and appearance. Adrenals/Urinary Tract: No masses identified. Probable tiny sub-cm left renal cyst noted. No evidence of hydronephrosis. Unremarkable unopacified urinary bladder. Stomach/Bowel: Diverticulosis is seen involving the sigmoid colon, however there is no evidence of diverticulitis. Mild dilatation of multiple mid small bowel loops are seen, with transition point in the anterior mid pelvis (image 28/5). No mass or inflammatory process identified at this transition point, suggesting an adhesion. Vascular/Lymphatic: No pathologically enlarged lymph nodes. No abdominal aortic aneurysm. Aortic atherosclerosis. Reproductive:  No mass or other significant abnormality. Other: Small amount of free fluid seen within the pelvis and bilateral upper quadrants, but no abscess identified. No evidence of free intraperitoneal or other extraluminal air. Musculoskeletal: No suspicious bone lesions identified. Lumbar spine degenerative changes noted. IMPRESSION: Findings consistent with partial small bowel obstruction, with transition point in the anterior mid pelvis suspicious for adhesion. Small amount of free fluid. No evidence of abscess or free intraperitoneal air. Sigmoid diverticulosis, without radiographic  evidence of diverticulitis. Electronically Signed   By: Earle Gell M.D.   On: 05/28/2018 19:14   Dg Abd Acute W/chest  Result Date: 05/27/2018 CLINICAL DATA:  Vomiting and abdominal pain. EXAM: DG ABDOMEN ACUTE W/ 1V CHEST COMPARISON:  None. FINDINGS: The cardiomediastinal contours are normal. The lungs are clear. There is no free intra-abdominal air. No dilated bowel loops to suggest obstruction. Small volume of stool throughout the colon. Calcifications in the left upper quadrant may  be vascular but are poorly localized. No acute osseous abnormalities are seen. IMPRESSION: 1. Normal bowel gas pattern. No free air. 2. Rounded calcifications projecting over the left upper quadrant are poorly localized radiographically, may be vascular. 3. No acute pulmonary process. Electronically Signed   By: Keith Rake M.D.   On: 05/27/2018 01:40    EKG: Independently reviewed.  Sinus bradycardia 58 bpm with similar RSR' pattern seen on previous EKG  Assessment/Plan Partial small bowel obstruction: Acute.  Patient reports nausea and vomiting with right upper quadrant abdominal pain now which became more generalized.  Imaging studies showed dilated loops of small bowel with mid anterior abdomen transition point concerning for partial small bowel obstruction.  Patient risk factors include previous surgeries hysterectomy, partial salpingo-oophorectomy, and appendectomy.  General surgery was consulted. - Admit to MedSurg. - N.p.o. - Small bowel order set initiated - NG tube to suction  - Monitor intake and output - IV fluids normal saline at 75 mL/h - Dilaudid IV as needed pain - Appreciate general surgery consultative services, will follow-up for further recommendation  Leukocytosis: WBC elevated 10.8 but appears to be downward trending.  Suspect secondary to above - Continue to monitor  CAD with history of angina: Patient denies any reports of chest pain at this time. - Continue SL nitroglycerin as  needed per chest pain - Restart Plavix when able to tolerate p.o.  Paroxysmal atrial fibrillation: Patient appears to be in sinus rhythm at this time. - Continue to monitor  GERD - Protonix IV  DVT prophylaxis: Lovenox Code Status: Full  Family Communication: Discussed plan of care with the patient and family present bedside Disposition Plan: Likely discharge home in 1 to 2 days if clinically improving Consults called: General surgery Admission status: Observation  Norval Morton MD [facility] Pager [phone]   If 7PM-7AM, please contact night-coverage [email or web address] Password [email or web address]  05/28/2018, 8:09 PM

## 2018-05-28 NOTE — ED Provider Notes (Signed)
[facility] DEPT Provider Note   CSN: [id number] Arrival date & time: 05/28/18  1448     History   Chief Complaint Chief Complaint  Patient presents with  . Abdominal Pain  . Emesis    HPI Sherry Barnett is a 84 y.o. female.  HPI  84 year old female presents with vomiting, abdominal pain and abdominal distention.  She states she has been having some right-sided abdominal pain and epigastric abdominal pain along with vomiting for about 2 days.  She was seen here on the first day it started and discharged home.  Saw her PCP and was sent here for a CT scan.  There is no back pain, chest pain, or shortness of breath.  Currently she is dry heaving.  She has been having multiple different antiemetics but they do not seem to help.  No urinary symptoms.  She had a little bit of diarrhea the very first day but since then has had no bowel movements and she is not sure if she still passing flatus.  She is belching some.  Pain is moderate, about 5 out of 10 and is occasionally sharp and occasionally just "painful".  Past Medical History:  Diagnosis Date  . A-fib ([place])   . Anemia   . Arthritis    "a little in my hands" (08/13/2017)  . Blepharitis    chronic  . CAD (coronary artery disease), native coronary artery    10/18 PCI/DES to p/mLAD, normal EF  . Cardiovascular risk factor    23%  . Degenerative joint disease (DJD) of lumbar spine   . Diverticulosis   . Endometriosis   . Fibroids   . GERD (gastroesophageal reflux disease)   . Hematuria, microscopic [id number]  . Hemorrhoids   . Hypercholesterolemia    10 years  . Osteopenia 03/2016   start alendrodate  . PONV (postoperative nausea and vomiting)   . Renal disease    stage2/stage 3    Patient Active Problem List   Diagnosis Date Noted  . Coronary artery disease involving native coronary artery of native heart with angina pectoris ([place])   . Syncope 08/13/2017  . Angina pectoris ([place]) 03/14/2017  .  Hyperlipidemia LDL goal <70 03/14/2017  . Paroxysmal atrial fibrillation ([place]) 03/14/2017  . H/O class III angina pectoris 03/14/2017    Past Surgical History:  Procedure Laterality Date  . APPENDECTOMY    . CARDIAC CATHETERIZATION  08/13/2017  . CATARACT EXTRACTION W/ INTRAOCULAR LENS  IMPLANT, BILATERAL Bilateral 2001 -  11/2016   "left-right"  . CORONARY ANGIOPLASTY WITH STENT PLACEMENT    . CORONARY STENT INTERVENTION N/A 03/14/2017   Procedure: CORONARY STENT INTERVENTION;  Surgeon: Belva Crome, MD;  Location: [facility];  Service: Cardiovascular;  Laterality: N/A;  . DILATION AND CURETTAGE OF UTERUS    . LEFT HEART CATH AND CORONARY ANGIOGRAPHY N/A 03/14/2017   Procedure: LEFT HEART CATH AND CORONARY ANGIOGRAPHY;  Surgeon: Belva Crome, MD;  Location: [facility];  Service: Cardiovascular;  Laterality: N/A;  . LEFT HEART CATH AND CORONARY ANGIOGRAPHY N/A 08/13/2017   Procedure: LEFT HEART CATH AND CORONARY ANGIOGRAPHY;  Surgeon: Belva Crome, MD;  Location: [facility];  Service: Cardiovascular;  Laterality: N/A;  . OVARIAN CYST SURGERY Bilateral   . SALPINGOOPHORECTOMY Bilateral    endometriosis; "still have a piece of my left ovary"  . TONSILLECTOMY       OB History   No obstetric history on file.  Home Medications    Prior to Admission medications   Medication Sig Start Date End Date Taking? Authorizing Provider  Artificial Tear Ointment (ARTIFICIAL TEARS) ointment Place 1 drop into both eyes 4 (four) times daily as needed (dry eyes). Refresh Tears for dry eyes.   Yes [provider]  aspirin 81 MG EC tablet Take 81 mg by mouth daily with breakfast. Swallow whole.   Yes [provider]  ondansetron (ZOFRAN-ODT) 8 MG disintegrating tablet Take 8 mg by mouth every 12 (twelve) hours as needed for nausea or vomiting.   Yes [provider]  tobramycin-dexamethasone (TOBRADEX) ophthalmic ointment Place 1 application into  both eyes See admin instructions. Tuesday AND Saturday patient does not use. Will apply at night on all other nights.   Yes [provider]  alendronate (FOSAMAX) 70 MG tablet Take 70 mg by mouth every Saturday. Take with a full glass of water on an empty stomach.     [provider]  atorvastatin (LIPITOR) 80 MG tablet TAKE 1 TABLET BY MOUTH EVERY DAY AT 6:00 PM Patient taking differently: Take 80 mg by mouth every evening.  06/03/17   Belva Crome, MD  Azelaic Acid (FINACEA) 15 % cream Apply 1 application topically 2 (two) times daily. After skin is thoroughly washed and patted dry, gently but thoroughly massage a thin film of azelaic acid cream into the affected area twice daily, in the morning and evening.     [provider]  calcium carbonate (TUMS EX) 750 MG chewable tablet Chew 1-2 tablets by mouth 2 (two) times daily as needed (for hearburn/indigestion.).     [provider]  cholecalciferol (VITAMIN D) 1000 units tablet Take 1,000 Units by mouth daily.    [provider]  clopidogrel (PLAVIX) 75 MG tablet TAKE 1 TABLET BY MOUTH DAILY 04/08/18   Belva Crome, MD  famotidine (PEPCID) 20 MG tablet Take 1 tablet (20 mg total) by mouth 2 (two) times daily. 05/27/18   Lacretia Leigh, MD  isosorbide mononitrate (IMDUR) 60 MG 24 hr tablet Take 1 tablet (60 mg total) by mouth daily. 08/02/17 07/28/18  Belva Crome, MD  metoprolol succinate (TOPROL-XL) 50 MG 24 hr tablet Take 1 tablet (50 mg total) by mouth daily. Take with or immediately following a meal. 07/23/17 07/18/18  Belva Crome, MD  nitroGLYCERIN (NITROSTAT) 0.4 MG SL tablet Place 1 tablet (0.4 mg total) under the tongue every 5 (five) minutes as needed for chest pain. 02/26/17 08/02/18  Belva Crome, MD  pantoprazole (PROTONIX) 40 MG tablet Take 40 mg by mouth daily as needed (acid reflux).    [provider]  sucralfate (CARAFATE) 1 g tablet Take 1 tablet (1 g total) by mouth 4 (four)  times daily. 05/27/18   Lacretia Leigh, MD    Family History Family History  Problem Relation Age of Onset  . Heart attack Mother   . Alzheimer's disease Father   . Hypertension Father   . Stroke Father   . Other Sister 71       MVA    Social History Social History   Tobacco Use  . Smoking status: Former Smoker    Packs/day: 0.10    Years: 5.00    Pack years: 0.50    Types: Cigarettes    Last attempt to quit: 02/06/1969    Years since quitting: 49.3  . Smokeless tobacco: Never Used  Substance Use Topics  . Alcohol use: No  . Drug  use: No     Allergies   Sulfa antibiotics; Novocain [procaine]; and Penicillins   Review of Systems Review of Systems  Respiratory: Negative for shortness of breath.   Cardiovascular: Negative for chest pain.  Gastrointestinal: Positive for abdominal distention, abdominal pain, constipation, diarrhea, nausea and vomiting.  Genitourinary: Negative for dysuria.  Musculoskeletal: Negative for back pain.  All other systems reviewed and are negative.    Physical Exam Updated Vital Signs BP 121/78   Pulse (!) 57   Temp (!) 97.5 F (36.4 C) (Oral)   Resp 11   Wt 62.1 kg   SpO2 100%   BMI 24.64 kg/m   Physical Exam Vitals signs and nursing note reviewed.  Constitutional:      General: She is not in acute distress.    Appearance: She is well-developed.  HENT:     Head: Normocephalic and atraumatic.     Right Ear: External ear normal.     Left Ear: External ear normal.     Nose: Nose normal.  Eyes:     General:        Right eye: No discharge.        Left eye: No discharge.  Cardiovascular:     Rate and Rhythm: Normal rate and regular rhythm.     Heart sounds: Normal heart sounds.  Pulmonary:     Effort: Pulmonary effort is normal.     Breath sounds: Normal breath sounds.  Abdominal:     Palpations: Abdomen is soft.     Tenderness: There is generalized abdominal tenderness.  Skin:    General: Skin is warm and dry.    Neurological:     Mental Status: She is alert.  Psychiatric:        Mood and Affect: Mood is not anxious.      ED Treatments / Results  Labs (all labs ordered are listed, but only abnormal results are displayed) Labs Reviewed  COMPREHENSIVE METABOLIC PANEL - Abnormal; Notable for the following components:      Result Value   Glucose, Bld 109 (*)    Total Protein 6.3 (*)    GFR calc non Af Amer 58 (*)    All other components within normal limits  CBC - Abnormal; Notable for the following components:   WBC 10.8 (*)    All other components within normal limits  URINALYSIS, ROUTINE W REFLEX MICROSCOPIC - Abnormal; Notable for the following components:   Hgb urine dipstick LARGE (*)    Ketones, ur 80 (*)    Leukocytes, UA TRACE (*)    All other components within normal limits  LIPASE, BLOOD  I-STAT TROPONIN, ED    EKG EKG Interpretation  Date/Time:  Wednesday May 28 2018 15:45:54 EST Ventricular Rate:  58 PR Interval:    QRS Duration: 102 QT Interval:  447 QTC Calculation: 439 R Axis:   46 Text Interpretation:  Sinus rhythm RSR' in V1 or V2, right VCD or RVH no significant change since Mar 2019 Confirmed by Sherwood Gambler [phone]) on 05/28/2018 3:55:34 PM   Radiology Ct Abdomen Pelvis W Contrast  Result Date: 05/28/2018 CLINICAL DATA:  Abdominal pain, distention, and nausea and vomiting for several days. Previous appendectomy. EXAM: CT ABDOMEN AND PELVIS WITH CONTRAST TECHNIQUE: Multidetector CT imaging of the abdomen and pelvis was performed using the standard protocol following bolus administration of intravenous contrast. CONTRAST:  158mL ISOVUE-300 IOPAMIDOL (ISOVUE-300) INJECTION 61% COMPARISON:  None. FINDINGS: Lower Chest: No acute findings. Hepatobiliary: No hepatic masses  identified. Probable tiny sub-cm cyst in right hepatic lobe. Gallbladder is unremarkable. No evidence of biliary ductal dilatation. Pancreas:  No mass or inflammatory changes. Spleen: Within  normal limits in size and appearance. Adrenals/Urinary Tract: No masses identified. Probable tiny sub-cm left renal cyst noted. No evidence of hydronephrosis. Unremarkable unopacified urinary bladder. Stomach/Bowel: Diverticulosis is seen involving the sigmoid colon, however there is no evidence of diverticulitis. Mild dilatation of multiple mid small bowel loops are seen, with transition point in the anterior mid pelvis (image 28/5). No mass or inflammatory process identified at this transition point, suggesting an adhesion. Vascular/Lymphatic: No pathologically enlarged lymph nodes. No abdominal aortic aneurysm. Aortic atherosclerosis. Reproductive:  No mass or other significant abnormality. Other: Small amount of free fluid seen within the pelvis and bilateral upper quadrants, but no abscess identified. No evidence of free intraperitoneal or other extraluminal air. Musculoskeletal: No suspicious bone lesions identified. Lumbar spine degenerative changes noted. IMPRESSION: Findings consistent with partial small bowel obstruction, with transition point in the anterior mid pelvis suspicious for adhesion. Small amount of free fluid. No evidence of abscess or free intraperitoneal air. Sigmoid diverticulosis, without radiographic evidence of diverticulitis. Electronically Signed   By: Earle Gell M.D.   On: 05/28/2018 19:14   Dg Abd Acute W/chest  Result Date: 05/27/2018 CLINICAL DATA:  Vomiting and abdominal pain. EXAM: DG ABDOMEN ACUTE W/ 1V CHEST COMPARISON:  None. FINDINGS: The cardiomediastinal contours are normal. The lungs are clear. There is no free intra-abdominal air. No dilated bowel loops to suggest obstruction. Small volume of stool throughout the colon. Calcifications in the left upper quadrant may be vascular but are poorly localized. No acute osseous abnormalities are seen. IMPRESSION: 1. Normal bowel gas pattern. No free air. 2. Rounded calcifications projecting over the left upper quadrant are  poorly localized radiographically, may be vascular. 3. No acute pulmonary process. Electronically Signed   By: Keith Rake M.D.   On: 05/27/2018 01:40    Procedures Procedures (including critical care time)  Medications Ordered in ED Medications  lidocaine (XYLOCAINE) 2 % jelly 1 application (has no administration in time range)  sodium chloride 0.9 % bolus 1,000 mL (1,000 mLs Intravenous New Bag/Given 05/28/18 1557)  fentaNYL (SUBLIMAZE) injection 50 mcg (50 mcg Intravenous Given 05/28/18 1557)  ondansetron (ZOFRAN) injection 4 mg (4 mg Intravenous Given 05/28/18 1557)  fentaNYL (SUBLIMAZE) injection 50 mcg (50 mcg Intravenous Given 05/28/18 1751)  iopamidol (ISOVUE-300) 61 % injection 100 mL (100 mLs Intravenous Contrast Given 05/28/18 1811)  HYDROmorphone (DILAUDID) injection 0.5 mg (0.5 mg Intravenous Given 05/28/18 1946)     Initial Impression / Assessment and Plan / ED Course  I have reviewed the triage vital signs and the nursing notes.  Pertinent labs & imaging results that were available during my care of the patient were reviewed by me and considered in my medical decision making (see chart for details).     CT scan confirms small bowel obstruction.  The patient is uncomfortable though medicines do temporarily relieve some of her symptoms.  I discussed with Dr. Dema Severin of surgery who asks for NG tube to be placed.  She has been given IV fluids.  Hospitalist service will admit.  Final Clinical Impressions(s) / ED Diagnoses   Final diagnoses:  SBO (small bowel obstruction) [facility])    ED Discharge Orders    None       Sherwood Gambler, MD 05/28/18 2030

## 2018-05-28 NOTE — ED Notes (Signed)
ED TO INPATIENT HANDOFF REPORT  Name/Age/Gender Sherry Barnett 84 y.o. female  Code Status    Code Status Orders  (From admission, onward)         Start     Ordered   05/28/18 2055  Full code  Continuous     05/28/18 2101        Code Status History    Date Active Date Inactive Code Status Order ID Comments User Context   08/13/2017 1752 08/14/2017 1545 Full Code [id number]  Belva Crome, MD Inpatient   08/13/2017 1102 08/13/2017 1752 Full Code [id number]  Belva Crome, MD Inpatient   03/14/2017 1808 03/15/2017 1607 Full Code [id number]  Belva Crome, MD Inpatient    Advance Directive Documentation     Most Recent Value  Type of Advance Directive  Healthcare Power of Attorney, Living will  Pre-existing out of facility DNR order (yellow form or pink MOST form)  -  "MOST" Form in Place?  -      Home/SNF/Other Home  Chief Complaint Abdominal Pain; Throwing Up   Level of Care/Admitting Diagnosis ED Disposition    ED Disposition Condition Comment   Admit  Hospital Area: [place] [[id number]]  Level of Care: Med-Surg [16]  Diagnosis: Partial small bowel obstruction [facility]) [[id number]]  Admitting Physician: Norval Morton [[id number]]  Attending Physician: Norval Morton [[id number]]  PT Class (Do Not Modify): Observation [104]  PT Acc Code (Do Not Modify): Observation [10022]       Medical History Past Medical History:  Diagnosis Date  . A-fib ([place])   . Anemia   . Arthritis    "a little in my hands" (08/13/2017)  . Blepharitis    chronic  . CAD (coronary artery disease), native coronary artery    10/18 PCI/DES to p/mLAD, normal EF  . Cardiovascular risk factor    23%  . Degenerative joint disease (DJD) of lumbar spine   . Diverticulosis   . Endometriosis   . Fibroids   . GERD (gastroesophageal reflux disease)   . Hematuria, microscopic [id number]  . Hemorrhoids   . Hypercholesterolemia    10 years  . Osteopenia 03/2016   start alendrodate  . PONV  (postoperative nausea and vomiting)   . Renal disease    stage2/stage 3    Allergies Allergies  Allergen Reactions  . Sulfa Antibiotics Other (See Comments)    HEMATURIA  . Novocain [Procaine] Palpitations  . Penicillins Rash    Has patient had a PCN reaction causing immediate rash, facial/tongue/throat swelling, SOB or lightheadedness with hypotension: Yes Has patient had a PCN reaction causing severe rash involving mucus membranes or skin necrosis: Unknown Has patient had a PCN reaction that required hospitalization: No Has patient had a PCN reaction occurring within the last 10 years: No If all of the above answers are "NO", then may proceed with Cephalosporin use.     IV Location/Drains/Wounds Patient Lines/Drains/Airways Status   Active Line/Drains/Airways    Name:   Placement date:   Placement time:   Site:   Days:   Peripheral IV 05/26/18 Left Antecubital   05/26/18    2246    Antecubital   2   Peripheral IV 05/28/18 Right Antecubital   05/28/18    1556    Antecubital   less than 1   NG/OG Tube Nasogastric 14 Fr. Right nare Xray;Aucultation Documented cm marking at nare/ corner of mouth   05/28/18    2018  Right nare   less than 1          Labs/Imaging Results for orders placed or performed during the hospital encounter of 05/28/18 (from the past 48 hour(s))  Lipase, blood     Status: None   Collection Time: 05/28/18  4:00 PM  Result Value Ref Range   Lipase 27 11 - 51 U/L    Comment: Performed at [facility], [place] [address] [place], [place] [address]  Comprehensive metabolic panel     Status: Abnormal   Collection Time: 05/28/18  4:00 PM  Result Value Ref Range   Sodium 136 135 - 145 mmol/L   Potassium 3.7 3.5 - 5.1 mmol/L   Chloride 99 98 - 111 mmol/L   CO2 26 22 - 32 mmol/L   Glucose, Bld 109 (H) 70 - 99 mg/dL   BUN 16 8 - 23 mg/dL   Creatinine, Ser 0.91 0.44 - 1.00 mg/dL   Calcium 8.9 8.9 - 10.3 mg/dL   Total Protein 6.3 (L) 6.5 -  8.1 g/dL   Albumin 4.0 3.5 - 5.0 g/dL   AST 28 15 - 41 U/L   ALT 27 0 - 44 U/L   Alkaline Phosphatase 61 38 - 126 U/L   Total Bilirubin 1.1 0.3 - 1.2 mg/dL   GFR calc non Af Amer 58 (L) >60 mL/min   GFR calc Af Amer >60 >60 mL/min   Anion gap 11 5 - 15    Comment: Performed at [facility], [place] [address] [place], [place] [address]  CBC     Status: Abnormal   Collection Time: 05/28/18  4:00 PM  Result Value Ref Range   WBC 10.8 (H) 4.0 - 10.5 K/uL   RBC 4.27 3.87 - 5.11 MIL/uL   Hemoglobin 13.3 12.0 - 15.0 g/dL   HCT 41.6 36.0 - 46.0 %   MCV 97.4 80.0 - 100.0 fL   MCH 31.1 26.0 - 34.0 pg   MCHC 32.0 30.0 - 36.0 g/dL   RDW 13.6 11.5 - 15.5 %   Platelets 216 150 - 400 K/uL   nRBC 0.0 0.0 - 0.2 %    Comment: Performed at [facility], [place] [address], [place],  [address]  I-stat troponin, ED     Status: None   Collection Time: 05/28/18  4:09 PM  Result Value Ref Range   Troponin i, poc 0.02 0.00 - 0.08 ng/mL   Comment 3            Comment: Due to the release kinetics of cTnI, a negative result within the first hours of the onset of symptoms does not rule out myocardial infarction with certainty. If myocardial infarction is still suspected, repeat the test at appropriate intervals.   Urinalysis, Routine w reflex microscopic     Status: Abnormal   Collection Time: 05/28/18  5:55 PM  Result Value Ref Range   Color, Urine YELLOW YELLOW   APPearance CLEAR CLEAR   Specific Gravity, Urine 1.018 1.005 - 1.030   pH 6.0 5.0 - 8.0   Glucose, UA NEGATIVE NEGATIVE mg/dL   Hgb urine dipstick LARGE (A) NEGATIVE   Bilirubin Urine NEGATIVE NEGATIVE   Ketones, ur 80 (A) NEGATIVE mg/dL   Protein, ur NEGATIVE NEGATIVE mg/dL   Nitrite NEGATIVE NEGATIVE   Leukocytes, UA TRACE (A) NEGATIVE   RBC / HPF 21-50 0 - 5 RBC/hpf   WBC, UA 11-20 0 - 5 WBC/hpf   Bacteria, UA  NONE SEEN NONE SEEN   Squamous Epithelial / LPF 0-5 0 - 5    Comment: Performed  at [facility], [place] [address] [place], [place] [address]   Ct Abdomen Pelvis W Contrast  Result Date: 05/28/2018 CLINICAL DATA:  Abdominal pain, distention, and nausea and vomiting for several days. Previous appendectomy. EXAM: CT ABDOMEN AND PELVIS WITH CONTRAST TECHNIQUE: Multidetector CT imaging of the abdomen and pelvis was performed using the standard protocol following bolus administration of intravenous contrast. CONTRAST:  172mL ISOVUE-300 IOPAMIDOL (ISOVUE-300) INJECTION 61% COMPARISON:  None. FINDINGS: Lower Chest: No acute findings. Hepatobiliary: No hepatic masses identified. Probable tiny sub-cm cyst in right hepatic lobe. Gallbladder is unremarkable. No evidence of biliary ductal dilatation. Pancreas:  No mass or inflammatory changes. Spleen: Within normal limits in size and appearance. Adrenals/Urinary Tract: No masses identified. Probable tiny sub-cm left renal cyst noted. No evidence of hydronephrosis. Unremarkable unopacified urinary bladder. Stomach/Bowel: Diverticulosis is seen involving the sigmoid colon, however there is no evidence of diverticulitis. Mild dilatation of multiple mid small bowel loops are seen, with transition point in the anterior mid pelvis (image 28/5). No mass or inflammatory process identified at this transition point, suggesting an adhesion. Vascular/Lymphatic: No pathologically enlarged lymph nodes. No abdominal aortic aneurysm. Aortic atherosclerosis. Reproductive:  No mass or other significant abnormality. Other: Small amount of free fluid seen within the pelvis and bilateral upper quadrants, but no abscess identified. No evidence of free intraperitoneal or other extraluminal air. Musculoskeletal: No suspicious bone lesions identified. Lumbar spine degenerative changes noted. IMPRESSION: Findings consistent with partial small bowel obstruction, with transition point in the anterior mid pelvis suspicious for adhesion. Small amount of free  fluid. No evidence of abscess or free intraperitoneal air. Sigmoid diverticulosis, without radiographic evidence of diverticulitis. Electronically Signed   By: Earle Gell M.D.   On: 05/28/2018 19:14   Dg Abd Acute W/chest  Result Date: 05/27/2018 CLINICAL DATA:  Vomiting and abdominal pain. EXAM: DG ABDOMEN ACUTE W/ 1V CHEST COMPARISON:  None. FINDINGS: The cardiomediastinal contours are normal. The lungs are clear. There is no free intra-abdominal air. No dilated bowel loops to suggest obstruction. Small volume of stool throughout the colon. Calcifications in the left upper quadrant may be vascular but are poorly localized. No acute osseous abnormalities are seen. IMPRESSION: 1. Normal bowel gas pattern. No free air. 2. Rounded calcifications projecting over the left upper quadrant are poorly localized radiographically, may be vascular. 3. No acute pulmonary process. Electronically Signed   By: Keith Rake M.D.   On: 05/27/2018 01:40   Dg Abd Portable 1 View  Result Date: 05/28/2018 CLINICAL DATA:  NG tube placement EXAM: PORTABLE ABDOMEN - 1 VIEW COMPARISON:  05/27/2018 radiographs and CT abdomen and pelvis 05/28/2018 FINDINGS: A gastric tube is seen with tip and side port below the left hemidiaphragm in the expected location of the stomach. Opacification of the renal collecting system and included bladder from recent contrast administration. Mild levoconvex curvature of the lumbar spine with degenerative disc and facet arthropathy. Mild diffuse gaseous distention of small bowel loops in the lower abdomen pelvis compatible with findings on recent CT and suggestive of partial SBO. IMPRESSION: No significant change from 05/27/2018. Electronically Signed   By: Ashley Royalty M.D.   On: 05/28/2018 20:41   EKG Interpretation  Date/Time:  Wednesday May 28 2018 15:45:54 EST Ventricular Rate:  58 PR Interval:    QRS Duration: 102 QT Interval:  447 QTC Calculation: 439 R Axis:  46 Text  Interpretation:  Sinus rhythm RSR' in V1 or V2, right VCD or RVH no significant change since Mar 2019 Confirmed by Sherwood Gambler [phone]) on 05/28/2018 3:55:34 PM   Pending Labs Unresulted Labs (From admission, onward)    Start     Ordered   05/29/18 0500  CBC  Tomorrow morning,   R     05/28/18 2101   05/29/18 3785  Basic metabolic panel  Tomorrow morning,   R     05/28/18 2101   05/29/18 0500  Magnesium  Tomorrow morning,   R     05/28/18 2101          Vitals/Pain Today's Vitals   05/28/18 2030 05/28/18 2100 05/28/18 2130 05/28/18 2137  BP: (!) 135/53 (!) 134/44 (!) 132/49 (!) 132/49  Pulse: (!) 58 61 (!) 55 70  Resp: 14 11 12 12   Temp:      TempSrc:      SpO2: 100% 100% 100% 100%  Weight:      PainSc:        Isolation Precautions No active isolations  Medications Medications  HYDROmorphone (DILAUDID) injection 0.25-0.5 mg (has no administration in time range)  pantoprazole (PROTONIX) injection 40 mg (has no administration in time range)  nitroGLYCERIN (NITROSTAT) SL tablet 0.4 mg (has no administration in time range)  artificial tears ointment 1 drop (has no administration in time range)  tobramycin-dexamethasone (TOBRADEX) ophthalmic ointment 1 application (has no administration in time range)  diatrizoate meglumine-sodium (GASTROGRAFIN) 66-10 % solution 90 mL (has no administration in time range)  enoxaparin (LOVENOX) injection 40 mg (has no administration in time range)  sodium chloride flush (NS) 0.9 % injection 3 mL (has no administration in time range)  0.9 %  sodium chloride infusion (has no administration in time range)  sodium chloride 0.9 % bolus 1,000 mL (1,000 mLs Intravenous New Bag/Given 05/28/18 1557)  fentaNYL (SUBLIMAZE) injection 50 mcg (50 mcg Intravenous Given 05/28/18 1557)  ondansetron (ZOFRAN) injection 4 mg (4 mg Intravenous Given 05/28/18 1557)  fentaNYL (SUBLIMAZE) injection 50 mcg (50 mcg Intravenous Given 05/28/18 1751)  iopamidol  (ISOVUE-300) 61 % injection 100 mL (100 mLs Intravenous Contrast Given 05/28/18 1811)  HYDROmorphone (DILAUDID) injection 0.5 mg (0.5 mg Intravenous Given 05/28/18 1946)  lidocaine (XYLOCAINE) 2 % jelly 1 application (1 application Topical Given 05/28/18 2048)    Mobility walks

## 2018-05-28 NOTE — ED Notes (Signed)
ED Provider at bedside. 

## 2018-05-28 NOTE — Consult Note (Signed)
CC: Consult by ED-P Dr. Regenia Skeeter for pSBO  HPI: Sherry Barnett is an 84 y.o. female with hx of HTN, HLD, CAD, afib (on plavix + 81mg  asa) who presents to the emergency room with complaints of nausea/vomiting that began 05/26/18 at 5 PM.  She has had some crampy abdominal pain associated with this.  She states she has not passed gas or had a bowel movement since Monday.  Monday morning she did report having diarrhea but none since.  Nothing seems to make her symptoms better or worse.  She denies any fever/chills.  She reports having had a prior BSO through a Pfannenstiel incision She is also had a prior appendectomy  She denies ever having had a colonoscopy.  She reports fear of perforation as a friend of hers at church had this complication occur.  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 A-fib ([place])   . Anemia   . Arthritis    "a little in my hands" (08/13/2017)  . Blepharitis    chronic  . CAD (coronary artery disease), native coronary artery    10/18 PCI/DES to p/mLAD, normal EF  . Cardiovascular risk factor    23%  . Degenerative joint disease (DJD) of lumbar spine   . Diverticulosis   . Endometriosis   . Fibroids   . GERD (gastroesophageal reflux disease)   . Hematuria, microscopic [id number]  . Hemorrhoids   . Hypercholesterolemia    10 years  . Osteopenia 03/2016   start alendrodate  . PONV (postoperative nausea and vomiting)   . Renal disease    stage2/stage 3    Past Surgical History:  Procedure Laterality Date  . APPENDECTOMY    . CARDIAC CATHETERIZATION  08/13/2017  . CATARACT EXTRACTION W/ INTRAOCULAR LENS  IMPLANT, BILATERAL Bilateral 2001 -  11/2016   "left-right"  . CORONARY ANGIOPLASTY WITH STENT PLACEMENT    . CORONARY STENT INTERVENTION N/A 03/14/2017   Procedure: CORONARY STENT INTERVENTION;  Surgeon: Belva Crome, MD;  Location: [facility];  Service: Cardiovascular;  Laterality: N/A;  . DILATION  AND CURETTAGE OF UTERUS    . LEFT HEART CATH AND CORONARY ANGIOGRAPHY N/A 03/14/2017   Procedure: LEFT HEART CATH AND CORONARY ANGIOGRAPHY;  Surgeon: Belva Crome, MD;  Location: [facility];  Service: Cardiovascular;  Laterality: N/A;  . LEFT HEART CATH AND CORONARY ANGIOGRAPHY N/A 08/13/2017   Procedure: LEFT HEART CATH AND CORONARY ANGIOGRAPHY;  Surgeon: Belva Crome, MD;  Location: [facility];  Service: Cardiovascular;  Laterality: N/A;  . OVARIAN CYST SURGERY Bilateral   . SALPINGOOPHORECTOMY Bilateral    endometriosis; "still have a piece of my left ovary"  . TONSILLECTOMY      Family History  Problem Relation Age of Onset  . Heart attack Mother   . Alzheimer's disease Father   . Hypertension Father   . Stroke Father   . Other Sister 82       MVA    Social:  reports that she quit smoking about 49 years ago. Her smoking use included cigarettes. She has a 0.50 pack-year smoking history. She has never used smokeless tobacco. She reports that she does not drink alcohol or use drugs.  Allergies:  Allergies  Allergen Reactions  . Sulfa Antibiotics Other (See Comments)    HEMATURIA  . Novocain [Procaine] Palpitations  . Penicillins Rash    Has patient had a PCN  reaction causing immediate rash, facial/tongue/throat swelling, SOB or lightheadedness with hypotension: Yes Has patient had a PCN reaction causing severe rash involving mucus membranes or skin necrosis: Unknown Has patient had a PCN reaction that required hospitalization: No Has patient had a PCN reaction occurring within the last 10 years: No If all of the above answers are "NO", then may proceed with Cephalosporin use.     Medications: I have reviewed the patient's current medications.  Results for orders placed or performed during the hospital encounter of 05/28/18 (from the past 48 hour(s))  Lipase, blood     Status: None   Collection Time: 05/28/18  4:00 PM  Result Value Ref Range   Lipase 27 11  - 51 U/L    Comment: Performed at [facility], [place] [address], [place], [place] [address]  Comprehensive metabolic panel     Status: Abnormal   Collection Time: 05/28/18  4:00 PM  Result Value Ref Range   Sodium 136 135 - 145 mmol/L   Potassium 3.7 3.5 - 5.1 mmol/L   Chloride 99 98 - 111 mmol/L   CO2 26 22 - 32 mmol/L   Glucose, Bld 109 (H) 70 - 99 mg/dL   BUN 16 8 - 23 mg/dL   Creatinine, Ser 0.91 0.44 - 1.00 mg/dL   Calcium 8.9 8.9 - 10.3 mg/dL   Total Protein 6.3 (L) 6.5 - 8.1 g/dL   Albumin 4.0 3.5 - 5.0 g/dL   AST 28 15 - 41 U/L   ALT 27 0 - 44 U/L   Alkaline Phosphatase 61 38 - 126 U/L   Total Bilirubin 1.1 0.3 - 1.2 mg/dL   GFR calc non Af Amer 58 (L) >60 mL/min   GFR calc Af Amer >60 >60 mL/min   Anion gap 11 5 - 15    Comment: Performed at [facility], [place] [address] [place], [place] [address]  CBC     Status: Abnormal   Collection Time: 05/28/18  4:00 PM  Result Value Ref Range   WBC 10.8 (H) 4.0 - 10.5 K/uL   RBC 4.27 3.87 - 5.11 MIL/uL   Hemoglobin 13.3 12.0 - 15.0 g/dL   HCT 41.6 36.0 - 46.0 %   MCV 97.4 80.0 - 100.0 fL   MCH 31.1 26.0 - 34.0 pg   MCHC 32.0 30.0 - 36.0 g/dL   RDW 13.6 11.5 - 15.5 %   Platelets 216 150 - 400 K/uL   nRBC 0.0 0.0 - 0.2 %    Comment: Performed at [facility], [place] [address], [place], [place] [address]  I-stat troponin, ED     Status: None   Collection Time: 05/28/18  4:09 PM  Result Value Ref Range   Troponin i, poc 0.02 0.00 - 0.08 ng/mL   Comment 3            Comment: Due to the release kinetics of cTnI, a negative result within the first hours of the onset of symptoms does not rule out myocardial infarction with certainty. If myocardial infarction is still suspected, repeat the test at appropriate intervals.   Urinalysis, Routine w reflex microscopic     Status: Abnormal   Collection Time: 05/28/18  5:55 PM  Result Value Ref Range   Color, Urine YELLOW YELLOW     APPearance CLEAR CLEAR   Specific Gravity, Urine 1.018 1.005 - 1.030   pH 6.0 5.0 - 8.0   Glucose, UA NEGATIVE NEGATIVE mg/dL  Hgb urine dipstick LARGE (A) NEGATIVE   Bilirubin Urine NEGATIVE NEGATIVE   Ketones, ur 80 (A) NEGATIVE mg/dL   Protein, ur NEGATIVE NEGATIVE mg/dL   Nitrite NEGATIVE NEGATIVE   Leukocytes, UA TRACE (A) NEGATIVE   RBC / HPF 21-50 0 - 5 RBC/hpf   WBC, UA 11-20 0 - 5 WBC/hpf   Bacteria, UA NONE SEEN NONE SEEN   Squamous Epithelial / LPF 0-5 0 - 5    Comment: Performed at [facility], [place] [address] [place], [place] [address]    Ct Abdomen Pelvis W Contrast  Result Date: 05/28/2018 CLINICAL DATA:  Abdominal pain, distention, and nausea and vomiting for several days. Previous appendectomy. EXAM: CT ABDOMEN AND PELVIS WITH CONTRAST TECHNIQUE: Multidetector CT imaging of the abdomen and pelvis was performed using the standard protocol following bolus administration of intravenous contrast. CONTRAST:  142mL ISOVUE-300 IOPAMIDOL (ISOVUE-300) INJECTION 61% COMPARISON:  None. FINDINGS: Lower Chest: No acute findings. Hepatobiliary: No hepatic masses identified. Probable tiny sub-cm cyst in right hepatic lobe. Gallbladder is unremarkable. No evidence of biliary ductal dilatation. Pancreas:  No mass or inflammatory changes. Spleen: Within normal limits in size and appearance. Adrenals/Urinary Tract: No masses identified. Probable tiny sub-cm left renal cyst noted. No evidence of hydronephrosis. Unremarkable unopacified urinary bladder. Stomach/Bowel: Diverticulosis is seen involving the sigmoid colon, however there is no evidence of diverticulitis. Mild dilatation of multiple mid small bowel loops are seen, with transition point in the anterior mid pelvis (image 28/5). No mass or inflammatory process identified at this transition point, suggesting an adhesion. Vascular/Lymphatic: No pathologically enlarged lymph nodes. No abdominal aortic aneurysm. Aortic  atherosclerosis. Reproductive:  No mass or other significant abnormality. Other: Small amount of free fluid seen within the pelvis and bilateral upper quadrants, but no abscess identified. No evidence of free intraperitoneal or other extraluminal air. Musculoskeletal: No suspicious bone lesions identified. Lumbar spine degenerative changes noted. IMPRESSION: Findings consistent with partial small bowel obstruction, with transition point in the anterior mid pelvis suspicious for adhesion. Small amount of free fluid. No evidence of abscess or free intraperitoneal air. Sigmoid diverticulosis, without radiographic evidence of diverticulitis. Electronically Signed   By: Earle Gell M.D.   On: 05/28/2018 19:14   Dg Abd Acute W/chest  Result Date: 05/27/2018 CLINICAL DATA:  Vomiting and abdominal pain. EXAM: DG ABDOMEN ACUTE W/ 1V CHEST COMPARISON:  None. FINDINGS: The cardiomediastinal contours are normal. The lungs are clear. There is no free intra-abdominal air. No dilated bowel loops to suggest obstruction. Small volume of stool throughout the colon. Calcifications in the left upper quadrant may be vascular but are poorly localized. No acute osseous abnormalities are seen. IMPRESSION: 1. Normal bowel gas pattern. No free air. 2. Rounded calcifications projecting over the left upper quadrant are poorly localized radiographically, may be vascular. 3. No acute pulmonary process. Electronically Signed   By: Keith Rake M.D.   On: 05/27/2018 01:40   Dg Abd Portable 1 View  Result Date: 05/28/2018 CLINICAL DATA:  NG tube placement EXAM: PORTABLE ABDOMEN - 1 VIEW COMPARISON:  05/27/2018 radiographs and CT abdomen and pelvis 05/28/2018 FINDINGS: A gastric tube is seen with tip and side port below the left hemidiaphragm in the expected location of the stomach. Opacification of the renal collecting system and included bladder from recent contrast administration. Mild levoconvex curvature of the lumbar spine with  degenerative disc and facet arthropathy. Mild diffuse gaseous distention of small bowel loops in the lower abdomen pelvis compatible with findings  on recent CT and suggestive of partial SBO. IMPRESSION: No significant change from 05/27/2018. Electronically Signed   By: Ashley Royalty M.D.   On: 05/28/2018 20:41    ROS - all of the below systems have been reviewed with the patient and positives are indicated with bold text General: chills, fever or night sweats Eyes: blurry vision or double vision ENT: epistaxis or sore throat Allergy/Immunology: itchy/watery eyes or nasal congestion Hematologic/Lymphatic: bleeding problems, blood clots or swollen lymph nodes Endocrine: temperature intolerance or unexpected weight changes Breast: new or changing breast lumps or nipple discharge Resp: cough, shortness of breath, or wheezing CV: chest pain or dyspnea on exertion GI: as per HPI GU: dysuria, trouble voiding, or hematuria MSK: joint pain or joint stiffness Neuro: TIA or stroke symptoms Derm: pruritus and skin lesion changes Psych: anxiety and depression  PE Blood pressure (!) 137/43, pulse (!) 58, temperature 98.2 F (36.8 C), temperature source Oral, resp. rate 12, weight 62.1 kg, SpO2 99 %. Constitutional: NAD; conversant; no deformities Eyes: Moist conjunctiva; no lid lag; anicteric; PERRL Neck: Trachea midline; no thyromegaly Lungs: Normal respiratory effort; no tactile fremitus CV: RRR; no palpable thrills; no pitting edema GI: Abd soft, mildly distended, nontender; no reboun; no guarding; no palpable hepatosplenomegaly MSK: Normal gait; no clubbing/cyanosis Psychiatric: Appropriate affect; alert and oriented x3 Lymphatic: No palpable cervical or axillary lymphadenopathy  Results for orders placed or performed during the hospital encounter of 05/28/18 (from the past 48 hour(s))  Lipase, blood     Status: None   Collection Time: 05/28/18  4:00 PM  Result Value Ref Range   Lipase 27 11  - 51 U/L    Comment: Performed at [facility], [place] [address], [place], [place] [address]  Comprehensive metabolic panel     Status: Abnormal   Collection Time: 05/28/18  4:00 PM  Result Value Ref Range   Sodium 136 135 - 145 mmol/L   Potassium 3.7 3.5 - 5.1 mmol/L   Chloride 99 98 - 111 mmol/L   CO2 26 22 - 32 mmol/L   Glucose, Bld 109 (H) 70 - 99 mg/dL   BUN 16 8 - 23 mg/dL   Creatinine, Ser 0.91 0.44 - 1.00 mg/dL   Calcium 8.9 8.9 - 10.3 mg/dL   Total Protein 6.3 (L) 6.5 - 8.1 g/dL   Albumin 4.0 3.5 - 5.0 g/dL   AST 28 15 - 41 U/L   ALT 27 0 - 44 U/L   Alkaline Phosphatase 61 38 - 126 U/L   Total Bilirubin 1.1 0.3 - 1.2 mg/dL   GFR calc non Af Amer 58 (L) >60 mL/min   GFR calc Af Amer >60 >60 mL/min   Anion gap 11 5 - 15    Comment: Performed at [facility], [place] [address], [place], [place] [address]  CBC     Status: Abnormal   Collection Time: 05/28/18  4:00 PM  Result Value Ref Range   WBC 10.8 (H) 4.0 - 10.5 K/uL   RBC 4.27 3.87 - 5.11 MIL/uL   Hemoglobin 13.3 12.0 - 15.0 g/dL   HCT 41.6 36.0 - 46.0 %   MCV 97.4 80.0 - 100.0 fL   MCH 31.1 26.0 - 34.0 pg   MCHC 32.0 30.0 - 36.0 g/dL   RDW 13.6 11.5 - 15.5 %   Platelets 216 150 - 400 K/uL   nRBC 0.0 0.0 - 0.2 %    Comment: Performed at [facility],  [place] [address] [place], [place] [address]  I-stat troponin, ED     Status: None   Collection Time: 05/28/18  4:09 PM  Result Value Ref Range   Troponin i, poc 0.02 0.00 - 0.08 ng/mL   Comment 3            Comment: Due to the release kinetics of cTnI, a negative result within the first hours of the onset of symptoms does not rule out myocardial infarction with certainty. If myocardial infarction is still suspected, repeat the test at appropriate intervals.   Urinalysis, Routine w reflex microscopic     Status: Abnormal   Collection Time: 05/28/18  5:55 PM  Result Value Ref Range   Color, Urine YELLOW YELLOW     APPearance CLEAR CLEAR   Specific Gravity, Urine 1.018 1.005 - 1.030   pH 6.0 5.0 - 8.0   Glucose, UA NEGATIVE NEGATIVE mg/dL   Hgb urine dipstick LARGE (A) NEGATIVE   Bilirubin Urine NEGATIVE NEGATIVE   Ketones, ur 80 (A) NEGATIVE mg/dL   Protein, ur NEGATIVE NEGATIVE mg/dL   Nitrite NEGATIVE NEGATIVE   Leukocytes, UA TRACE (A) NEGATIVE   RBC / HPF 21-50 0 - 5 RBC/hpf   WBC, UA 11-20 0 - 5 WBC/hpf   Bacteria, UA NONE SEEN NONE SEEN   Squamous Epithelial / LPF 0-5 0 - 5    Comment: Performed at [facility], [place] [address] [place], [place] [address]    Ct Abdomen Pelvis W Contrast  Result Date: 05/28/2018 CLINICAL DATA:  Abdominal pain, distention, and nausea and vomiting for several days. Previous appendectomy. EXAM: CT ABDOMEN AND PELVIS WITH CONTRAST TECHNIQUE: Multidetector CT imaging of the abdomen and pelvis was performed using the standard protocol following bolus administration of intravenous contrast. CONTRAST:  166mL ISOVUE-300 IOPAMIDOL (ISOVUE-300) INJECTION 61% COMPARISON:  None. FINDINGS: Lower Chest: No acute findings. Hepatobiliary: No hepatic masses identified. Probable tiny sub-cm cyst in right hepatic lobe. Gallbladder is unremarkable. No evidence of biliary ductal dilatation. Pancreas:  No mass or inflammatory changes. Spleen: Within normal limits in size and appearance. Adrenals/Urinary Tract: No masses identified. Probable tiny sub-cm left renal cyst noted. No evidence of hydronephrosis. Unremarkable unopacified urinary bladder. Stomach/Bowel: Diverticulosis is seen involving the sigmoid colon, however there is no evidence of diverticulitis. Mild dilatation of multiple mid small bowel loops are seen, with transition point in the anterior mid pelvis (image 28/5). No mass or inflammatory process identified at this transition point, suggesting an adhesion. Vascular/Lymphatic: No pathologically enlarged lymph nodes. No abdominal aortic aneurysm. Aortic  atherosclerosis. Reproductive:  No mass or other significant abnormality. Other: Small amount of free fluid seen within the pelvis and bilateral upper quadrants, but no abscess identified. No evidence of free intraperitoneal or other extraluminal air. Musculoskeletal: No suspicious bone lesions identified. Lumbar spine degenerative changes noted. IMPRESSION: Findings consistent with partial small bowel obstruction, with transition point in the anterior mid pelvis suspicious for adhesion. Small amount of free fluid. No evidence of abscess or free intraperitoneal air. Sigmoid diverticulosis, without radiographic evidence of diverticulitis. Electronically Signed   By: Earle Gell M.D.   On: 05/28/2018 19:14   Dg Abd Acute W/chest  Result Date: 05/27/2018 CLINICAL DATA:  Vomiting and abdominal pain. EXAM: DG ABDOMEN ACUTE W/ 1V CHEST COMPARISON:  None. FINDINGS: The cardiomediastinal contours are normal. The lungs are clear. There is no free intra-abdominal air. No dilated bowel loops to suggest obstruction. Small volume of stool throughout the colon. Calcifications in  the left upper quadrant may be vascular but are poorly localized. No acute osseous abnormalities are seen. IMPRESSION: 1. Normal bowel gas pattern. No free air. 2. Rounded calcifications projecting over the left upper quadrant are poorly localized radiographically, may be vascular. 3. No acute pulmonary process. Electronically Signed   By: Keith Rake M.D.   On: 05/27/2018 01:40   Dg Abd Portable 1 View  Result Date: 05/28/2018 CLINICAL DATA:  NG tube placement EXAM: PORTABLE ABDOMEN - 1 VIEW COMPARISON:  05/27/2018 radiographs and CT abdomen and pelvis 05/28/2018 FINDINGS: A gastric tube is seen with tip and side port below the left hemidiaphragm in the expected location of the stomach. Opacification of the renal collecting system and included bladder from recent contrast administration. Mild levoconvex curvature of the lumbar spine with  degenerative disc and facet arthropathy. Mild diffuse gaseous distention of small bowel loops in the lower abdomen pelvis compatible with findings on recent CT and suggestive of partial SBO. IMPRESSION: No significant change from 05/27/2018. Electronically Signed   By: Ashley [place]ty M.D.   On: 05/28/2018 20:41   A/P: Sherry Barnett is an 84 y.o. female with HTN, HLD, CAD, afib (on plavix + 81 mg asa) here with pSBO - transition point appears in mid pelvis  -Medicine has admitted the patient - we appreciate their assistance in her care -NPO, MIVF, NGT -Cardiology evaluation; consider holding plavix if cleared for this in the event her pSBO does not resolve on its own -SB protocol (ordered) -We will follow with you  Sharon Mt. Dema Severin, M.D. [facility]

## 2018-05-29 ENCOUNTER — Observation Stay (HOSPITAL_COMMUNITY): Payer: Medicare Other

## 2018-05-29 ENCOUNTER — Other Ambulatory Visit: Payer: Self-pay

## 2018-05-29 ENCOUNTER — Inpatient Hospital Stay (HOSPITAL_COMMUNITY): Payer: Medicare Other

## 2018-05-29 DIAGNOSIS — Z888 Allergy status to other drugs, medicaments and biological substances status: Secondary | ICD-10-CM | POA: Diagnosis not present

## 2018-05-29 DIAGNOSIS — M5136 Other intervertebral disc degeneration, lumbar region: Secondary | ICD-10-CM | POA: Diagnosis present

## 2018-05-29 DIAGNOSIS — K219 Gastro-esophageal reflux disease without esophagitis: Secondary | ICD-10-CM | POA: Diagnosis present

## 2018-05-29 DIAGNOSIS — I25119 Atherosclerotic heart disease of native coronary artery with unspecified angina pectoris: Secondary | ICD-10-CM | POA: Diagnosis present

## 2018-05-29 DIAGNOSIS — K56609 Unspecified intestinal obstruction, unspecified as to partial versus complete obstruction: Secondary | ICD-10-CM | POA: Diagnosis not present

## 2018-05-29 DIAGNOSIS — Z820 Family history of epilepsy and other diseases of the nervous system: Secondary | ICD-10-CM | POA: Diagnosis not present

## 2018-05-29 DIAGNOSIS — M8580 Other specified disorders of bone density and structure, unspecified site: Secondary | ICD-10-CM | POA: Diagnosis present

## 2018-05-29 DIAGNOSIS — Z955 Presence of coronary angioplasty implant and graft: Secondary | ICD-10-CM | POA: Diagnosis not present

## 2018-05-29 DIAGNOSIS — I480 Paroxysmal atrial fibrillation: Secondary | ICD-10-CM | POA: Diagnosis present

## 2018-05-29 DIAGNOSIS — E7800 Pure hypercholesterolemia, unspecified: Secondary | ICD-10-CM | POA: Diagnosis present

## 2018-05-29 DIAGNOSIS — Z961 Presence of intraocular lens: Secondary | ICD-10-CM | POA: Diagnosis present

## 2018-05-29 DIAGNOSIS — E876 Hypokalemia: Secondary | ICD-10-CM | POA: Diagnosis present

## 2018-05-29 DIAGNOSIS — K56600 Partial intestinal obstruction, unspecified as to cause: Secondary | ICD-10-CM | POA: Diagnosis not present

## 2018-05-29 DIAGNOSIS — I129 Hypertensive chronic kidney disease with stage 1 through stage 4 chronic kidney disease, or unspecified chronic kidney disease: Secondary | ICD-10-CM | POA: Diagnosis present

## 2018-05-29 DIAGNOSIS — Z880 Allergy status to penicillin: Secondary | ICD-10-CM | POA: Diagnosis not present

## 2018-05-29 DIAGNOSIS — Z9841 Cataract extraction status, right eye: Secondary | ICD-10-CM | POA: Diagnosis not present

## 2018-05-29 DIAGNOSIS — D72829 Elevated white blood cell count, unspecified: Secondary | ICD-10-CM | POA: Diagnosis present

## 2018-05-29 DIAGNOSIS — N183 Chronic kidney disease, stage 3 (moderate): Secondary | ICD-10-CM | POA: Diagnosis present

## 2018-05-29 DIAGNOSIS — Z8249 Family history of ischemic heart disease and other diseases of the circulatory system: Secondary | ICD-10-CM | POA: Diagnosis not present

## 2018-05-29 DIAGNOSIS — Z9842 Cataract extraction status, left eye: Secondary | ICD-10-CM | POA: Diagnosis not present

## 2018-05-29 DIAGNOSIS — R109 Unspecified abdominal pain: Secondary | ICD-10-CM | POA: Diagnosis not present

## 2018-05-29 DIAGNOSIS — D631 Anemia in chronic kidney disease: Secondary | ICD-10-CM | POA: Diagnosis present

## 2018-05-29 DIAGNOSIS — E785 Hyperlipidemia, unspecified: Secondary | ICD-10-CM | POA: Diagnosis present

## 2018-05-29 DIAGNOSIS — Z823 Family history of stroke: Secondary | ICD-10-CM | POA: Diagnosis not present

## 2018-05-29 DIAGNOSIS — K5730 Diverticulosis of large intestine without perforation or abscess without bleeding: Secondary | ICD-10-CM | POA: Diagnosis present

## 2018-05-29 DIAGNOSIS — Z882 Allergy status to sulfonamides status: Secondary | ICD-10-CM | POA: Diagnosis not present

## 2018-05-29 LAB — CBC
HCT: 40.2 % (ref 36.0–46.0)
Hemoglobin: 12.7 g/dL (ref 12.0–15.0)
MCH: 31.2 pg (ref 26.0–34.0)
MCHC: 31.6 g/dL (ref 30.0–36.0)
MCV: 98.8 fL (ref 80.0–100.0)
Platelets: 200 10*3/uL (ref 150–400)
RBC: 4.07 MIL/uL (ref 3.87–5.11)
RDW: 13.7 % (ref 11.5–15.5)
WBC: 10.7 10*3/uL — ABNORMAL HIGH (ref 4.0–10.5)
nRBC: 0 % (ref 0.0–0.2)

## 2018-05-29 LAB — BASIC METABOLIC PANEL
Anion gap: 9 (ref 5–15)
BUN: 16 mg/dL (ref 8–23)
CO2: 23 mmol/L (ref 22–32)
Calcium: 8.5 mg/dL — ABNORMAL LOW (ref 8.9–10.3)
Chloride: 107 mmol/L (ref 98–111)
Creatinine, Ser: 0.83 mg/dL (ref 0.44–1.00)
GFR calc Af Amer: >60 mL/min (ref >60)
GFR calc non Af Amer: >60 mL/min (ref >60)
GLUCOSE: 92 mg/dL (ref 70–99)
Potassium: 3.3 mmol/L — ABNORMAL LOW (ref 3.5–5.1)
Sodium: 139 mmol/L (ref 135–145)

## 2018-05-29 LAB — MAGNESIUM: Magnesium: 2.3 mg/dL (ref 1.7–2.4)

## 2018-05-29 MED ORDER — MENTHOL 3 MG MT LOZG: 1.0000 | LOZENGE | OROMUCOSAL | Status: DC | PRN

## 2018-05-29 MED ORDER — PHENOL 1.4 % MT LIQD
1.0000 | OROMUCOSAL | Status: DC | PRN
  Administered 2018-05-30: 1 via OROMUCOSAL
  Filled 2018-05-29: qty 177

## 2018-05-29 MED ORDER — KCL IN DEXTROSE-NACL 40-5-0.45 MEQ/L-%-% IV SOLN
INTRAVENOUS | Status: DC
  Administered 2018-05-29 – 2018-05-30 (×2): via INTRAVENOUS
  Filled 2018-05-29 (×3): qty 1000

## 2018-05-29 NOTE — Progress Notes (Signed)
[facility] Progress Note     Subjective: CC: Dizziness Patient feeling dizzy this AM, concerned her blood sugar may be getting low. Reports some nausea associated with irritation from NGT. No flatus or BM. Abdomen feels less distended. Pain improving. Would like to walk some today.   Objective: Vital signs in last 24 hours: Temp:  [97.5 F (36.4 C)-98.5 F (36.9 C)] 98.5 F (36.9 C) (12/19 0530) Pulse Rate:  [54-70] 63 (12/19 0722) Resp:  [11-22] 17 (12/19 0530) BP: (110-159)/(41-79) 110/79 (12/19 0722) SpO2:  [95 %-100 %] 98 % (12/19 0722) Weight:  [62.1 kg-64.4 kg] 64.4 kg (12/18 2218) Last BM Date: 05/26/18  Intake/Output from previous day: 12/18 0701 - 12/19 0700 In: 592.5 [I.V.:592.5] Out: 450 [Urine:200; Emesis/NG output:250] Intake/Output this shift: No intake/output data recorded.  PE: Gen:  Alert, NAD, pleasant Card:  Regular rate and rhythm Pulm:  Normal effort, clear to auscultation bilaterally Abd: Soft, mildly TTP in LUQ, non-distended, bowel sounds present, no HSM Skin: warm and dry, no rashes  Psych: A&Ox3   Lab Results:  Recent Labs    05/28/18 1600 05/29/18 0525  WBC 10.8* 10.7*  HGB 13.3 12.7  HCT 41.6 40.2  PLT 216 200   BMET Recent Labs    05/28/18 1600 05/29/18 0525  NA 136 139  K 3.7 3.3*  CL 99 107  CO2 26 23  GLUCOSE 109* 92  BUN 16 16  CREATININE 0.91 0.83  CALCIUM 8.9 8.5*   PT/INR No results for input(s): LABPROT, INR in the last 72 hours. CMP     Component Value Date/Time   NA 139 05/29/2018 0525   NA 142 08/02/2017 1304   K 3.3 (L) 05/29/2018 0525   CL 107 05/29/2018 0525   CO2 23 05/29/2018 0525   GLUCOSE 92 05/29/2018 0525   BUN 16 05/29/2018 0525   BUN 13 08/02/2017 1304   CREATININE 0.83 05/29/2018 0525   CALCIUM 8.5 (L) 05/29/2018 0525   PROT 6.3 (L) 05/28/2018 1600   PROT 6.4 05/23/2017 1007   ALBUMIN 4.0 05/28/2018 1600   ALBUMIN 4.0 05/23/2017 1007   AST 28 05/28/2018 1600   ALT 27  05/28/2018 1600   ALKPHOS 61 05/28/2018 1600   BILITOT 1.1 05/28/2018 1600   BILITOT 0.3 05/23/2017 1007   GFRNONAA >60 05/29/2018 0525   GFRAA >60 05/29/2018 0525   Lipase     Component Value Date/Time   LIPASE 27 05/28/2018 1600       Studies/Results: Ct Abdomen Pelvis W Contrast  Result Date: 05/28/2018 CLINICAL DATA:  Abdominal pain, distention, and nausea and vomiting for several days. Previous appendectomy. EXAM: CT ABDOMEN AND PELVIS WITH CONTRAST TECHNIQUE: Multidetector CT imaging of the abdomen and pelvis was performed using the standard protocol following bolus administration of intravenous contrast. CONTRAST:  177mL ISOVUE-300 IOPAMIDOL (ISOVUE-300) INJECTION 61% COMPARISON:  None. FINDINGS: Lower Chest: No acute findings. Hepatobiliary: No hepatic masses identified. Probable tiny sub-cm cyst in right hepatic lobe. Gallbladder is unremarkable. No evidence of biliary ductal dilatation. Pancreas:  No mass or inflammatory changes. Spleen: Within normal limits in size and appearance. Adrenals/Urinary Tract: No masses identified. Probable tiny sub-cm left renal cyst noted. No evidence of hydronephrosis. Unremarkable unopacified urinary bladder. Stomach/Bowel: Diverticulosis is seen involving the sigmoid colon, however there is no evidence of diverticulitis. Mild dilatation of multiple mid small bowel loops are seen, with transition point in the anterior mid pelvis (image 28/5). No mass or inflammatory process identified at this transition point,  suggesting an adhesion. Vascular/Lymphatic: No pathologically enlarged lymph nodes. No abdominal aortic aneurysm. Aortic atherosclerosis. Reproductive:  No mass or other significant abnormality. Other: Small amount of free fluid seen within the pelvis and bilateral upper quadrants, but no abscess identified. No evidence of free intraperitoneal or other extraluminal air. Musculoskeletal: No suspicious bone lesions identified. Lumbar spine  degenerative changes noted. IMPRESSION: Findings consistent with partial small bowel obstruction, with transition point in the anterior mid pelvis suspicious for adhesion. Small amount of free fluid. No evidence of abscess or free intraperitoneal air. Sigmoid diverticulosis, without radiographic evidence of diverticulitis. Electronically Signed   By: Earle Gell M.D.   On: 05/28/2018 19:14   Dg Abd Portable 1v-small Bowel Obstruction Protocol-initial, 8 Hr Delay  Result Date: 05/29/2018 CLINICAL DATA:  Small bowel obstruction. EXAM: PORTABLE ABDOMEN - 1 VIEW COMPARISON:  05/28/2018 FINDINGS: NG tube is noted in the stomach. Contrast material from prior CT noted in the urinary bladder. Contrast noted within small bowel loops. Dilated small bowel loops. No organomegaly or free air. Gas and stool noted within nondistended colon. IMPRESSION: Gas and oral contrast material noted within the small bowel which is dilated compatible with small bowel obstruction. No definite passage of contrast material into the colon. Electronically Signed   By: Rolm Baptise M.D.   On: 05/29/2018 07:30   Dg Abd Portable 1 View  Result Date: 05/28/2018 CLINICAL DATA:  NG tube placement EXAM: PORTABLE ABDOMEN - 1 VIEW COMPARISON:  05/27/2018 radiographs and CT abdomen and pelvis 05/28/2018 FINDINGS: A gastric tube is seen with tip and side port below the left hemidiaphragm in the expected location of the stomach. Opacification of the renal collecting system and included bladder from recent contrast administration. Mild levoconvex curvature of the lumbar spine with degenerative disc and facet arthropathy. Mild diffuse gaseous distention of small bowel loops in the lower abdomen pelvis compatible with findings on recent CT and suggestive of partial SBO. IMPRESSION: No significant change from 05/27/2018. Electronically Signed   By: Ashley Royalty M.D.   On: 05/28/2018 20:41    Anti-infectives: Anti-infectives (From admission, onward)    None       Assessment/Plan HTN HLD CAD - s/p stent  Atrial fibrillation  SBO - film this AM with contrast in small bowel, persistent sbo -  24h delay film tonight - NGT with ~300 cc feculent looking output - abdomen soft, no indications for acute surgical intervention - hopefully this will resolve with conservative management  Hypokalemia - changed IVF to D5 1/2 NS + 40 mEq of K, repeat BMET in AM  FEN: NPO, IVF; NGT to LIWS VTE: SCDs, lovenox ID: none  LOS: 0 days    Brigid Re , [facility] 05/29/2018, 8:39 AM Pager: [phone] Consults: [phone] Mon-Fri 7:00 am-4:30 pm Sat-Sun 7:00 am-11:30 am

## 2018-05-29 NOTE — Progress Notes (Signed)
[facility] PROGRESS NOTE  Sherry Barnett OIN:[id number] DOB: 1932/12/03 DOA: 05/28/2018 PCP: Lajean Manes, MD  Brief summary   84 y.o. female with medical history significant of CAD, A. fib, HLD, anemia, diverticulosis, and CKD stage II/III; who presents with complaints of nausea and vomiting, found to have SBO  Assessment/Plan:  Small bowel obstruction: cont per surgery. NG tube to suction, Monitor intake and output, cont iv fluids, pain control, antiemetics. Appreciate general surgery input   Leukocytosis: no s/s of systemic infection. appears to be downward trending.  Suspect secondary to above.   History of CAD. Stent (03/2017). No acute cardiopulmonary symptoms. Restart antiplatelets when able to take PO. Likely can d/c Plavix, since >1year from stent   Paroxysmal atrial fibrillation:not on anticoagulation at home. HR is stable.   GERD.  Protonix IV  Hypokalemia. Replace. Monitor   Code Status: full Family Communication: d/w patient, her family, RN (indicate person spoken with, relationship, and if by phone, the number) Disposition Plan: remains inpatient    Consultants:  Surgery   Procedures:  NTG  Antibiotics: Anti-infectives (From admission, onward)   None        (indicate start date, and stop date if known)  HPI/Subjective: No BM yet, mild abd pain. No flatus.   Objective: Vitals:   05/29/18 0722 05/29/18 1053  BP: 110/79 (!) 137/47  Pulse: 63 (!) 59  Resp:  16  Temp:  98.3 F (36.8 C)  SpO2: 98% 100%    Intake/Output Summary (Last 24 hours) at 05/29/2018 1224 Last data filed at 05/29/2018 0824 Gross per 24 hour  Intake 592.5 ml  Output 450 ml  Net 142.5 ml   Filed Weights   05/28/18 1504 05/28/18 2218  Weight: 62.1 kg 64.4 kg    Exam:   General:  No distress   Cardiovascular: s1,s2 rrr  Respiratory: CTA BL  Abdomen: soft, mild tender.   Musculoskeletal: no pedal edema    Data Reviewed: Basic Metabolic  Panel: Recent Labs  Lab 05/27/18 0258 05/28/18 1600 05/29/18 0525  NA 139 136 139  K 3.9 3.7 3.3*  CL 100 99 107  CO2 28 26 23   GLUCOSE 130* 109* 92  BUN 16 16 16   CREATININE 0.84 0.91 0.83  CALCIUM 9.4 8.9 8.5*  MG  --   --  2.3   Liver Function Tests: Recent Labs  Lab 05/27/18 0258 05/28/18 1600  AST 28 28  ALT 32 27  ALKPHOS 70 61  BILITOT 0.5 1.1  PROT 6.6 6.3*  ALBUMIN 4.1 4.0   Recent Labs  Lab 05/27/18 0258 05/28/18 1600  LIPASE 29 27   No results for input(s): AMMONIA in the last 168 hours. CBC: Recent Labs  Lab 05/27/18 0258 05/28/18 1600 05/29/18 0525  WBC 11.1* 10.8* 10.7*  NEUTROABS 8.9*  --   --   HGB 13.2 13.3 12.7  HCT 41.2 41.6 40.2  MCV 96.7 97.4 98.8  PLT 231 216 200   Cardiac Enzymes: No results for input(s): CKTOTAL, CKMB, CKMBINDEX, TROPONINI in the last 168 hours. BNP (last 3 results) No results for input(s): BNP in the last 8760 hours.  ProBNP (last 3 results) No results for input(s): PROBNP in the last 8760 hours.  CBG: No results for input(s): GLUCAP in the last 168 hours.  No results found for this or any previous visit (from the past 240 hour(s)).   Studies: Ct Abdomen Pelvis W Contrast  Result Date: 05/28/2018 CLINICAL DATA:  Abdominal pain, distention, and nausea  and vomiting for several days. Previous appendectomy. EXAM: CT ABDOMEN AND PELVIS WITH CONTRAST TECHNIQUE: Multidetector CT imaging of the abdomen and pelvis was performed using the standard protocol following bolus administration of intravenous contrast. CONTRAST:  16mL ISOVUE-300 IOPAMIDOL (ISOVUE-300) INJECTION 61% COMPARISON:  None. FINDINGS: Lower Chest: No acute findings. Hepatobiliary: No hepatic masses identified. Probable tiny sub-cm cyst in right hepatic lobe. Gallbladder is unremarkable. No evidence of biliary ductal dilatation. Pancreas:  No mass or inflammatory changes. Spleen: Within normal limits in size and appearance. Adrenals/Urinary Tract: No  masses identified. Probable tiny sub-cm left renal cyst noted. No evidence of hydronephrosis. Unremarkable unopacified urinary bladder. Stomach/Bowel: Diverticulosis is seen involving the sigmoid colon, however there is no evidence of diverticulitis. Mild dilatation of multiple mid small bowel loops are seen, with transition point in the anterior mid pelvis (image 28/5). No mass or inflammatory process identified at this transition point, suggesting an adhesion. Vascular/Lymphatic: No pathologically enlarged lymph nodes. No abdominal aortic aneurysm. Aortic atherosclerosis. Reproductive:  No mass or other significant abnormality. Other: Small amount of free fluid seen within the pelvis and bilateral upper quadrants, but no abscess identified. No evidence of free intraperitoneal or other extraluminal air. Musculoskeletal: No suspicious bone lesions identified. Lumbar spine degenerative changes noted. IMPRESSION: Findings consistent with partial small bowel obstruction, with transition point in the anterior mid pelvis suspicious for adhesion. Small amount of free fluid. No evidence of abscess or free intraperitoneal air. Sigmoid diverticulosis, without radiographic evidence of diverticulitis. Electronically Signed   By: Earle Gell M.D.   On: 05/28/2018 19:14   Dg Abd Portable 1v-small Bowel Obstruction Protocol-initial, 8 Hr Delay  Result Date: 05/29/2018 CLINICAL DATA:  Small bowel obstruction. EXAM: PORTABLE ABDOMEN - 1 VIEW COMPARISON:  05/28/2018 FINDINGS: NG tube is noted in the stomach. Contrast material from prior CT noted in the urinary bladder. Contrast noted within small bowel loops. Dilated small bowel loops. No organomegaly or free air. Gas and stool noted within nondistended colon. IMPRESSION: Gas and oral contrast material noted within the small bowel which is dilated compatible with small bowel obstruction. No definite passage of contrast material into the colon. Electronically Signed   By: Rolm Baptise M.D.   On: 05/29/2018 07:30   Dg Abd Portable 1 View  Result Date: 05/28/2018 CLINICAL DATA:  NG tube placement EXAM: PORTABLE ABDOMEN - 1 VIEW COMPARISON:  05/27/2018 radiographs and CT abdomen and pelvis 05/28/2018 FINDINGS: A gastric tube is seen with tip and side port below the left hemidiaphragm in the expected location of the stomach. Opacification of the renal collecting system and included bladder from recent contrast administration. Mild levoconvex curvature of the lumbar spine with degenerative disc and facet arthropathy. Mild diffuse gaseous distention of small bowel loops in the lower abdomen pelvis compatible with findings on recent CT and suggestive of partial SBO. IMPRESSION: No significant change from 05/27/2018. Electronically Signed   By: Ashley Royalty M.D.   On: 05/28/2018 20:41    Scheduled Meds: . enoxaparin (LOVENOX) injection  40 mg Subcutaneous QHS  . pantoprazole (PROTONIX) IV  40 mg Intravenous Q12H  . sodium chloride flush  3 mL Intravenous Q12H  . tobramycin-dexamethasone  1 application Both Eyes Once per day on Sun Mon Wed Thu Fri   Continuous Infusions: . dextrose 5 % and 0.45 % NaCl with KCl 40 mEq/L 100 mL/hr at 05/29/18 0858    Principal Problem:   Partial small bowel obstruction [facility]) Active Problems:   Paroxysmal atrial fibrillation ([place])  Coronary artery disease involving native coronary artery of native heart with angina pectoris ([place])   Leukocytosis   GERD (gastroesophageal reflux disease)    Time spent:>25 minutes     Kinnie Feil  [facility] Pager [phone]. If 7PM-7AM, please contact night-coverage at [email or web address], password [facility] 05/29/2018, 12:24 PM  LOS: 0 days

## 2018-05-30 ENCOUNTER — Inpatient Hospital Stay (HOSPITAL_COMMUNITY): Payer: Medicare Other

## 2018-05-30 LAB — BASIC METABOLIC PANEL
Anion gap: 9 (ref 5–15)
BUN: 14 mg/dL (ref 8–23)
CO2: 26 mmol/L (ref 22–32)
Calcium: 8.2 mg/dL — ABNORMAL LOW (ref 8.9–10.3)
Chloride: 104 mmol/L (ref 98–111)
Creatinine, Ser: 0.87 mg/dL (ref 0.44–1.00)
GFR calc Af Amer: >60 mL/min (ref >60)
GFR calc non Af Amer: >60 mL/min (ref >60)
GLUCOSE: 141 mg/dL — AB (ref 70–99)
Potassium: 4.4 mmol/L (ref 3.5–5.1)
Sodium: 139 mmol/L (ref 135–145)

## 2018-05-30 MED ORDER — METOPROLOL SUCCINATE ER 50 MG PO TB24: 50.0000 mg | ORAL_TABLET | Freq: Every day | ORAL | Status: DC

## 2018-05-30 MED ORDER — GLYCERIN (LAXATIVE) 2.1 G RE SUPP
1.0000 | Freq: Every day | RECTAL | Status: DC | PRN
  Administered 2018-05-30: 1 via RECTAL
  Filled 2018-05-30 (×2): qty 1

## 2018-05-30 MED ORDER — ONDANSETRON HCL 4 MG/2ML IJ SOLN: 4.0000 mg | Freq: Four times a day (QID) | INTRAMUSCULAR | Status: DC | PRN

## 2018-05-30 MED ORDER — DOCUSATE SODIUM 100 MG PO CAPS
100.0000 mg | ORAL_CAPSULE | Freq: Two times a day (BID) | ORAL | Status: DC
  Administered 2018-05-30 (×2): 100 mg via ORAL
  Filled 2018-05-30 (×3): qty 1

## 2018-05-30 MED ORDER — ISOSORBIDE MONONITRATE ER 60 MG PO TB24
60.0000 mg | ORAL_TABLET | Freq: Every day | ORAL | Status: DC
  Administered 2018-05-30 – 2018-06-01 (×2): 60 mg via ORAL
  Filled 2018-05-30 (×2): qty 1

## 2018-05-30 MED ORDER — ATORVASTATIN CALCIUM 40 MG PO TABS
80.0000 mg | ORAL_TABLET | Freq: Every evening | ORAL | Status: DC
  Administered 2018-05-30: 80 mg via ORAL
  Filled 2018-05-30: qty 2

## 2018-05-30 NOTE — Progress Notes (Signed)
Ambulate twice in the hall last night; had bowel movement at 0300 - small, brown, separate hard lumps. No c/o pain and NV at this time.

## 2018-05-30 NOTE — Progress Notes (Signed)
PROGRESS NOTE    Sherry Barnett  CLE:[id number] DOB: 03/29/1933 DOA: 05/28/2018 PCP: Lajean Manes, MD Brief Narrative: 84 y.o.femalewith medical history significant ofCAD, A. fib, HLD,anemia,diverticulosis, and CKDstage II/III; who presents with complaints of nausea and vomiting, found to have SBO  Assessment & Plan:   Principal Problem:   Partial small bowel obstruction (HCC) Active Problems:   Paroxysmal atrial fibrillation (HCC)   Coronary artery disease involving native coronary artery of native heart with angina pectoris (HCC)   Leukocytosis   GERD (gastroesophageal reflux disease)  Small bowel obstruction: NGtube DC'd today patient to try clear liquids advance diet as tolerated per surgery.   Leukocytosis: no s/s of systemic infection. appears to be downward trending. Suspect secondary to above.   History of CAD. Stent (03/2017). No acute cardiopulmonary symptoms. Restart antiplatelets when able to take PO.  Paroxysmal atrial fibrillation:not on anticoagulation at home. HR is stable.   GERD. Protonix IV  Hypokalemia. Replace. Monitor        Estimated body mass index is 25.56 kg/m as calculated from the following:   Height as of this encounter: 5' 2.5" (1.588 m).   Weight as of this encounter: 64.4 kg.  DVT prophylaxis: LOVENOX  Code Status: FULL Family Communication DW HUSBAND  Disposition Plan:  PENDING CLINICAL IMPROVEMENT   Consultants:   General surgery  Procedures: None Antimicrobials: None  Subjective: Anxious to try clear liquids last night having flatus 2 times this morning.   Objective: Vitals:   05/29/18 2151 05/30/18 0153 05/30/18 0600 05/30/18 1439  BP: 98/81 (!) 150/80 (!) 136/50 (!) 133/50  Pulse: 61 71  62  Resp: 16 14  14   Temp: 98.5 F (36.9 C) 98.2 F (36.8 C)  98.5 F (36.9 C)  TempSrc:  Oral    SpO2: 96% 98%  98%  Weight:      Height:        Intake/Output Summary (Last 24 hours) at 05/30/2018 1527 Last  data filed at 05/30/2018 1442 Gross per 24 hour  Intake 2322.36 ml  Output 350 ml  Net 1972.36 ml   Filed Weights   05/28/18 1504 05/28/18 2218  Weight: 62.1 kg 64.4 kg    Examination:  General exam: Appears calm and comfortable  Respiratory system: Clear to auscultation. Respiratory effort normal. Cardiovascular system: S1 & S2 heard, RRR. No JVD, murmurs, rubs, gallops or clicks. No pedal edema. Gastrointestinal system: Abdomen is nondistended, soft and nontender. No organomegaly or masses felt. Normal bowel sounds heard. Central nervous system: Alert and oriented. No focal neurological deficits. Extremities: Symmetric 5 x 5 power. Skin: No rashes, lesions or ulcers Psychiatry: Judgement and insight appear normal. Mood & affect appropriate.     Data Reviewed: I have personally reviewed following labs and imaging studies  CBC: Recent Labs  Lab 05/27/18 0258 05/28/18 1600 05/29/18 0525  WBC 11.1* 10.8* 10.7*  NEUTROABS 8.9*  --   --   HGB 13.2 13.3 12.7  HCT 41.2 41.6 40.2  MCV 96.7 97.4 98.8  PLT 231 216 944   Basic Metabolic Panel: Recent Labs  Lab 05/27/18 0258 05/28/18 1600 05/29/18 0525 05/30/18 0521  NA 139 136 139 139  K 3.9 3.7 3.3* 4.4  CL 100 99 107 104  CO2 28 26 23 26   GLUCOSE 130* 109* 92 141*  BUN 16 16 16 14   CREATININE 0.84 0.91 0.83 0.87  CALCIUM 9.4 8.9 8.5* 8.2*  MG  --   --  2.3  --  GFR: Estimated Creatinine Clearance: 43.7 mL/min (by C-G formula based on SCr of 0.87 mg/dL). Liver Function Tests: Recent Labs  Lab 05/27/18 0258 05/28/18 1600  AST 28 28  ALT 32 27  ALKPHOS 70 61  BILITOT 0.5 1.1  PROT 6.6 6.3*  ALBUMIN 4.1 4.0   Recent Labs  Lab 05/27/18 0258 05/28/18 1600  LIPASE 29 27   No results for input(s): AMMONIA in the last 168 hours. Coagulation Profile: No results for input(s): INR, PROTIME in the last 168 hours. Cardiac Enzymes: No results for input(s): CKTOTAL, CKMB, CKMBINDEX, TROPONINI in the last 168  hours. BNP (last 3 results) No results for input(s): PROBNP in the last 8760 hours. HbA1C: No results for input(s): HGBA1C in the last 72 hours. CBG: No results for input(s): GLUCAP in the last 168 hours. Lipid Profile: No results for input(s): CHOL, HDL, LDLCALC, TRIG, CHOLHDL, LDLDIRECT in the last 72 hours. Thyroid Function Tests: No results for input(s): TSH, T4TOTAL, FREET4, T3FREE, THYROIDAB in the last 72 hours. Anemia Panel: No results for input(s): VITAMINB12, FOLATE, FERRITIN, TIBC, IRON, RETICCTPCT in the last 72 hours. Sepsis Labs: No results for input(s): PROCALCITON, LATICACIDVEN in the last 168 hours.  No results found for this or any previous visit (from the past 240 hour(s)).       Radiology Studies: Ct Abdomen Pelvis W Contrast  Result Date: 05/28/2018 CLINICAL DATA:  Abdominal pain, distention, and nausea and vomiting for several days. Previous appendectomy. EXAM: CT ABDOMEN AND PELVIS WITH CONTRAST TECHNIQUE: Multidetector CT imaging of the abdomen and pelvis was performed using the standard protocol following bolus administration of intravenous contrast. CONTRAST:  174mL ISOVUE-300 IOPAMIDOL (ISOVUE-300) INJECTION 61% COMPARISON:  None. FINDINGS: Lower Chest: No acute findings. Hepatobiliary: No hepatic masses identified. Probable tiny sub-cm cyst in right hepatic lobe. Gallbladder is unremarkable. No evidence of biliary ductal dilatation. Pancreas:  No mass or inflammatory changes. Spleen: Within normal limits in size and appearance. Adrenals/Urinary Tract: No masses identified. Probable tiny sub-cm left renal cyst noted. No evidence of hydronephrosis. Unremarkable unopacified urinary bladder. Stomach/Bowel: Diverticulosis is seen involving the sigmoid colon, however there is no evidence of diverticulitis. Mild dilatation of multiple mid small bowel loops are seen, with transition point in the anterior mid pelvis (image 28/5). No mass or inflammatory process identified  at this transition point, suggesting an adhesion. Vascular/Lymphatic: No pathologically enlarged lymph nodes. No abdominal aortic aneurysm. Aortic atherosclerosis. Reproductive:  No mass or other significant abnormality. Other: Small amount of free fluid seen within the pelvis and bilateral upper quadrants, but no abscess identified. No evidence of free intraperitoneal or other extraluminal air. Musculoskeletal: No suspicious bone lesions identified. Lumbar spine degenerative changes noted. IMPRESSION: Findings consistent with partial small bowel obstruction, with transition point in the anterior mid pelvis suspicious for adhesion. Small amount of free fluid. No evidence of abscess or free intraperitoneal air. Sigmoid diverticulosis, without radiographic evidence of diverticulitis. Electronically Signed   By: Earle Gell M.D.   On: 05/28/2018 19:14   Dg Abd Portable 1v  Result Date: 05/30/2018 CLINICAL DATA:  Follow-up small bowel obstruction EXAM: PORTABLE ABDOMEN - 1 VIEW COMPARISON:  05/29/2018 FINDINGS: Interval removal of the nasogastric tube. Mild gaseous distension of small bowel. Air is seen within the colon. Oral contrast material is present within the colon. There is no bowel dilatation to suggest obstruction. There is no evidence of pneumoperitoneum, portal venous gas or pneumatosis. There are no pathologic calcifications along the expected course of the ureters. The osseous  structures are unremarkable. IMPRESSION: No bowel dilatation to suggest high-grade obstruction. Mild persistent gaseous distension of small bowel with air seen within the colon. Oral contrast material is again noted within the colon as well. Electronically Signed   By: Kathreen Devoid   On: 05/30/2018 13:15   Dg Abd Portable 1v  Result Date: 05/29/2018 CLINICAL DATA:  Small-bowel obstruction EXAM: PORTABLE ABDOMEN - 1 VIEW COMPARISON:  None. FINDINGS: There is enteric contrast now within the ascending colon. Nasogastric tube  tip and side port project over the gastric body. No dilated small bowel. IMPRESSION: Enteric contrast now within the ascending colon.No dilated bowel visualized. Electronically Signed   By: Ulyses Jarred M.D.   On: 05/29/2018 23:31   Dg Abd Portable 1v-small Bowel Obstruction Protocol-initial, 8 Hr Delay  Result Date: 05/29/2018 CLINICAL DATA:  Small bowel obstruction. EXAM: PORTABLE ABDOMEN - 1 VIEW COMPARISON:  05/28/2018 FINDINGS: NG tube is noted in the stomach. Contrast material from prior CT noted in the urinary bladder. Contrast noted within small bowel loops. Dilated small bowel loops. No organomegaly or free air. Gas and stool noted within nondistended colon. IMPRESSION: Gas and oral contrast material noted within the small bowel which is dilated compatible with small bowel obstruction. No definite passage of contrast material into the colon. Electronically Signed   By: Rolm Baptise M.D.   On: 05/29/2018 07:30   Dg Abd Portable 1 View  Result Date: 05/28/2018 CLINICAL DATA:  NG tube placement EXAM: PORTABLE ABDOMEN - 1 VIEW COMPARISON:  05/27/2018 radiographs and CT abdomen and pelvis 05/28/2018 FINDINGS: A gastric tube is seen with tip and side port below the left hemidiaphragm in the expected location of the stomach. Opacification of the renal collecting system and included bladder from recent contrast administration. Mild levoconvex curvature of the lumbar spine with degenerative disc and facet arthropathy. Mild diffuse gaseous distention of small bowel loops in the lower abdomen pelvis compatible with findings on recent CT and suggestive of partial SBO. IMPRESSION: No significant change from 05/27/2018. Electronically Signed   By: Ashley Royalty M.D.   On: 05/28/2018 20:41        Scheduled Meds: . atorvastatin  80 mg Oral QPM  . docusate sodium  100 mg Oral BID  . enoxaparin (LOVENOX) injection  40 mg Subcutaneous QHS  . isosorbide mononitrate  60 mg Oral Daily  . [START ON  05/31/2018] metoprolol succinate  50 mg Oral QAC breakfast  . pantoprazole (PROTONIX) IV  40 mg Intravenous Q12H  . sodium chloride flush  3 mL Intravenous Q12H  . tobramycin-dexamethasone  1 application Both Eyes Once per day on Sun Mon Wed Thu Fri   Continuous Infusions:   LOS: 1 day     Georgette Shell, MD [facility] If 7PM-7AM, please contact night-coverage [email or web address] Password [facility] 05/30/2018, 3:27 PM

## 2018-05-30 NOTE — Progress Notes (Signed)
[facility] Progress Note     Subjective: CC: abdomen sore Patient reports no abdominal pain but feels a little sore this AM. Not having any nausea but does note the NGT irritates throat. Ambulating. Had 2 BMs last night and is passing flatus.   Objective: Vital signs in last 24 hours: Temp:  [98.2 F (36.8 C)-98.8 F (37.1 C)] 98.2 F (36.8 C) (12/20 0153) Pulse Rate:  [57-71] 71 (12/20 0153) Resp:  [14-16] 14 (12/20 0153) BP: (98-155)/(47-81) 136/50 (12/20 0600) SpO2:  [96 %-100 %] 98 % (12/20 0153) Last BM Date: 05/26/18  Intake/Output from previous day: 12/19 0701 - 12/20 0700 In: 2102.4 [I.V.:2102.4] Out: 350 [Urine:100; Emesis/NG output:250] Intake/Output this shift: No intake/output data recorded.  PE: Gen:  Alert, NAD, pleasant Card:  Regular rate and rhythm Pulm:  Normal effort, clear to auscultation bilaterally Abd: Soft, non-tender, non-distended, bowel sounds present Skin: warm and dry, no rashes  Psych: A&Ox3   Lab Results:  Recent Labs    05/28/18 1600 05/29/18 0525  WBC 10.8* 10.7*  HGB 13.3 12.7  HCT 41.6 40.2  PLT 216 200   BMET Recent Labs    05/29/18 0525 05/30/18 0521  NA 139 139  K 3.3* 4.4  CL 107 104  CO2 23 26  GLUCOSE 92 141*  BUN 16 14  CREATININE 0.83 0.87  CALCIUM 8.5* 8.2*   PT/INR No results for input(s): LABPROT, INR in the last 72 hours. CMP     Component Value Date/Time   NA 139 05/30/2018 0521   NA 142 08/02/2017 1304   K 4.4 05/30/2018 0521   CL 104 05/30/2018 0521   CO2 26 05/30/2018 0521   GLUCOSE 141 (H) 05/30/2018 0521   BUN 14 05/30/2018 0521   BUN 13 08/02/2017 1304   CREATININE 0.87 05/30/2018 0521   CALCIUM 8.2 (L) 05/30/2018 0521   PROT 6.3 (L) 05/28/2018 1600   PROT 6.4 05/23/2017 1007   ALBUMIN 4.0 05/28/2018 1600   ALBUMIN 4.0 05/23/2017 1007   AST 28 05/28/2018 1600   ALT 27 05/28/2018 1600   ALKPHOS 61 05/28/2018 1600   BILITOT 1.1 05/28/2018 1600   BILITOT 0.3 05/23/2017  1007   GFRNONAA >60 05/30/2018 0521   GFRAA >60 05/30/2018 0521   Lipase     Component Value Date/Time   LIPASE 27 05/28/2018 1600       Studies/Results: Ct Abdomen Pelvis W Contrast  Result Date: 05/28/2018 CLINICAL DATA:  Abdominal pain, distention, and nausea and vomiting for several days. Previous appendectomy. EXAM: CT ABDOMEN AND PELVIS WITH CONTRAST TECHNIQUE: Multidetector CT imaging of the abdomen and pelvis was performed using the standard protocol following bolus administration of intravenous contrast. CONTRAST:  155mL ISOVUE-300 IOPAMIDOL (ISOVUE-300) INJECTION 61% COMPARISON:  None. FINDINGS: Lower Chest: No acute findings. Hepatobiliary: No hepatic masses identified. Probable tiny sub-cm cyst in right hepatic lobe. Gallbladder is unremarkable. No evidence of biliary ductal dilatation. Pancreas:  No mass or inflammatory changes. Spleen: Within normal limits in size and appearance. Adrenals/Urinary Tract: No masses identified. Probable tiny sub-cm left renal cyst noted. No evidence of hydronephrosis. Unremarkable unopacified urinary bladder. Stomach/Bowel: Diverticulosis is seen involving the sigmoid colon, however there is no evidence of diverticulitis. Mild dilatation of multiple mid small bowel loops are seen, with transition point in the anterior mid pelvis (image 28/5). No mass or inflammatory process identified at this transition point, suggesting an adhesion. Vascular/Lymphatic: No pathologically enlarged lymph nodes. No abdominal aortic aneurysm. Aortic atherosclerosis. Reproductive:  No mass or other significant abnormality. Other: Small amount of free fluid seen within the pelvis and bilateral upper quadrants, but no abscess identified. No evidence of free intraperitoneal or other extraluminal air. Musculoskeletal: No suspicious bone lesions identified. Lumbar spine degenerative changes noted. IMPRESSION: Findings consistent with partial small bowel obstruction, with transition  point in the anterior mid pelvis suspicious for adhesion. Small amount of free fluid. No evidence of abscess or free intraperitoneal air. Sigmoid diverticulosis, without radiographic evidence of diverticulitis. Electronically Signed   By: Earle Gell M.D.   On: 05/28/2018 19:14   Dg Abd Portable 1v  Result Date: 05/29/2018 CLINICAL DATA:  Small-bowel obstruction EXAM: PORTABLE ABDOMEN - 1 VIEW COMPARISON:  None. FINDINGS: There is enteric contrast now within the ascending colon. Nasogastric tube tip and side port project over the gastric body. No dilated small bowel. IMPRESSION: Enteric contrast now within the ascending colon.No dilated bowel visualized. Electronically Signed   By: Ulyses Jarred M.D.   On: 05/29/2018 23:31   Dg Abd Portable 1v-small Bowel Obstruction Protocol-initial, 8 Hr Delay  Result Date: 05/29/2018 CLINICAL DATA:  Small bowel obstruction. EXAM: PORTABLE ABDOMEN - 1 VIEW COMPARISON:  05/28/2018 FINDINGS: NG tube is noted in the stomach. Contrast material from prior CT noted in the urinary bladder. Contrast noted within small bowel loops. Dilated small bowel loops. No organomegaly or free air. Gas and stool noted within nondistended colon. IMPRESSION: Gas and oral contrast material noted within the small bowel which is dilated compatible with small bowel obstruction. No definite passage of contrast material into the colon. Electronically Signed   By: Rolm Baptise M.D.   On: 05/29/2018 07:30   Dg Abd Portable 1 View  Result Date: 05/28/2018 CLINICAL DATA:  NG tube placement EXAM: PORTABLE ABDOMEN - 1 VIEW COMPARISON:  05/27/2018 radiographs and CT abdomen and pelvis 05/28/2018 FINDINGS: A gastric tube is seen with tip and side port below the left hemidiaphragm in the expected location of the stomach. Opacification of the renal collecting system and included bladder from recent contrast administration. Mild levoconvex curvature of the lumbar spine with degenerative disc and facet  arthropathy. Mild diffuse gaseous distention of small bowel loops in the lower abdomen pelvis compatible with findings on recent CT and suggestive of partial SBO. IMPRESSION: No significant change from 05/27/2018. Electronically Signed   By: Ashley Royalty M.D.   On: 05/28/2018 20:41    Anti-infectives: Anti-infectives (From admission, onward)   None       Assessment/Plan HTN HLD CAD - s/p stent  Atrial fibrillation  SBO - 24h delay film with contrast in ascending colon - patient having some BMs - d/c NGT and start CLD - continue to mobilize - abdomen soft, no indications for acute surgical intervention - hopefully this will resolve with conservative management  Hypokalemia - 4.4, resolved  FEN: CLD VTE: SCDs, lovenox ID: none  LOS: 1 day    Brigid Re , [facility] Surgery 05/30/2018, 8:33 AM Pager: [phone] Consults: [phone] Mon-Fri 7:00 am-4:30 pm Sat-Sun 7:00 am-11:30 am

## 2018-05-30 NOTE — Evaluation (Signed)
Physical Therapy Evaluation Patient Details Name: Sherry Barnett MRN: [id number] DOB: Jun 03, 1933 Today's Date: 05/30/2018   History of Present Illness  84 y.o. female with medical history significant of CAD, A. fib, HLD, anemia, diverticulosis, and CKD stage II/III; pt presents with complaints of nausea and vomiting, found to have SBO   Clinical Impression  Patient evaluated by Physical Therapy with no further acute PT needs identified. All education has been completed and the patient has no further questions.  Pt ambulated 200 feet with HHA around unit and reports she can ambulate with spouse.  Pt pleasant and agreeable to continue ambulating with staff or spouse during acute stay.  No follow-up Physical Therapy or equipment needs. PT is signing off. Thank you for this referral.     Follow Up Recommendations No PT follow up    Equipment Recommendations  None recommended by PT    Recommendations for Other Services       Precautions / Restrictions Precautions Precautions: None      Mobility  Bed Mobility Overal bed mobility: Modified Independent                Transfers Overall transfer level: Needs assistance Equipment used: None Transfers: Sit to/from Stand Sit to Stand: Min guard;Supervision            Ambulation/Gait Ambulation/Gait assistance: Min Gaffer (Feet): 200 Feet Assistive device: 1 person hand held assist Gait Pattern/deviations: Step-through pattern;Decreased stride length     General Gait Details: pt requested HHA (ambulates with spouse holding his arm), steady with UE support  Stairs            Wheelchair Mobility    Modified Rankin (Stroke Patients Only)       Balance Overall balance assessment: (no hx of falls)                                           Pertinent Vitals/Pain Pain Assessment: No/denies pain    Home Living Family/patient expects to be discharged to:: Private  residence Living Arrangements: Spouse/significant other   Type of Home: House Home Access: Level entry     Home Layout: One level Home Equipment: None      Prior Function Level of Independence: Independent               Hand Dominance        Extremity/Trunk Assessment        Lower Extremity Assessment Lower Extremity Assessment: Overall WFL for tasks assessed       Communication   Communication: No difficulties  Cognition Arousal/Alertness: Awake/alert Behavior During Therapy: WFL for tasks assessed/performed Overall Cognitive Status: Within Functional Limits for tasks assessed                                        General Comments      Exercises     Assessment/Plan    PT Assessment Patent does not need any further PT services  PT Problem List         PT Treatment Interventions      PT Goals (Current goals can be found in the Care Plan section)  Acute Rehab PT Goals PT Goal Formulation: All assessment and education complete, DC therapy    Frequency  Barriers to discharge        Co-evaluation               AM-PAC PT "6 Clicks" Mobility  Outcome Measure Help needed turning from your back to your side while in a flat bed without using bedrails?: None Help needed moving from lying on your back to sitting on the side of a flat bed without using bedrails?: None Help needed moving to and from a bed to a chair (including a wheelchair)?: None Help needed standing up from a chair using your arms (e.g., wheelchair or bedside chair)?: None Help needed to walk in hospital room?: A Little Help needed climbing 3-5 steps with a railing? : A Little 6 Click Score: 22    End of Session   Activity Tolerance: Patient tolerated treatment well Patient left: in bed;with call bell/phone within reach;with family/visitor present   PT Visit Diagnosis: Difficulty in walking, not elsewhere classified (R26.2)    Time: 6381-7711 PT  Time Calculation (min) (ACUTE ONLY): 14 min   Charges:   PT Evaluation $PT Eval Low Complexity: [place], PT, DPT Acute Rehabilitation Services Office: [phone] Pager: [phone]  Trena Platt 05/30/2018, 4:00 PM

## 2018-05-30 NOTE — Progress Notes (Signed)
During rounds, pt made RN aware that she was having more abd pain since taking NG tube out.  Pt had ambulated in hall twice, stated she was passing gas, but pain was much worse. Abd soft on assessment, positive bowel sounds, no n/v.  PA Claiborne Billings with Gen surgery paged and made aware.  RN will give suppository, pain meds.  KUB ordered. RN will monitor.

## 2018-05-31 LAB — BASIC METABOLIC PANEL
Anion gap: 4 — ABNORMAL LOW (ref 5–15)
BUN: 15 mg/dL (ref 8–23)
CO2: 29 mmol/L (ref 22–32)
Calcium: 7.8 mg/dL — ABNORMAL LOW (ref 8.9–10.3)
Chloride: 102 mmol/L (ref 98–111)
Creatinine, Ser: 0.75 mg/dL (ref 0.44–1.00)
GFR calc Af Amer: >60 mL/min (ref >60)
GFR calc non Af Amer: >60 mL/min (ref >60)
GLUCOSE: 96 mg/dL (ref 70–99)
Potassium: 3.2 mmol/L — ABNORMAL LOW (ref 3.5–5.1)
Sodium: 135 mmol/L (ref 135–145)

## 2018-05-31 MED ORDER — POTASSIUM CHLORIDE CRYS ER 20 MEQ PO TBCR: 40.0000 meq | EXTENDED_RELEASE_TABLET | Freq: Once | ORAL | Status: DC

## 2018-05-31 MED ORDER — POTASSIUM CHLORIDE 10 MEQ/100ML IV SOLN
10.0000 meq | INTRAVENOUS | Status: AC
  Administered 2018-05-31 (×2): 10 meq via INTRAVENOUS
  Filled 2018-05-31 (×2): qty 100

## 2018-05-31 MED ORDER — MAGNESIUM SULFATE 2 GM/50ML IV SOLN
2.0000 g | Freq: Once | INTRAVENOUS | Status: AC
  Administered 2018-05-31: 2 g via INTRAVENOUS
  Filled 2018-05-31: qty 50

## 2018-05-31 NOTE — Progress Notes (Signed)
Dr. Rodena Piety paged about pt right eye being red and bloodshot. Pt reports no visual deficits or pain with eye but Rn is concerned. Pt eyelid is red as well. Rn noticed this around 1500 and felt eyes may just be dry and pt used eye drops she had in room from home.

## 2018-05-31 NOTE — Progress Notes (Signed)
Patient ID: Sherry Barnett, female   DOB: [birth year], 84 y.o.   MRN: [id number] [facility] Surgery Progress Note:   * No surgery found *  Subjective: Mental status is alert and oriented.  Had BM this AM Objective: Vital signs in last 24 hours: Temp:  [97.8 F (36.6 C)-99.4 F (37.4 C)] 97.8 F (36.6 C) (12/21 0505) Pulse Rate:  [62-68] 68 (12/21 0505) Resp:  [14-18] 18 (12/21 0505) BP: (115-135)/(44-50) 135/46 (12/21 0505) SpO2:  [98 %] 98 % (12/21 0505)  Intake/Output from previous day: 12/20 0701 - 12/21 0700 In: 833 [P.O.:830; I.V.:3] Out: 800 [Urine:800] Intake/Output this shift: Total I/O In: 360 [P.O.:360] Out: -   Physical Exam: Work of breathing is normal. No abdominal pain  Lab Results:  Results for orders placed or performed during the hospital encounter of 05/28/18 (from the past 48 hour(s))  Basic metabolic panel     Status: Abnormal   Collection Time: 05/30/18  5:21 AM  Result Value Ref Range   Sodium 139 135 - 145 mmol/L   Potassium 4.4 3.5 - 5.1 mmol/L    Comment: DELTA CHECK NOTED NO VISIBLE HEMOLYSIS    Chloride 104 98 - 111 mmol/L   CO2 26 22 - 32 mmol/L   Glucose, Bld 141 (H) 70 - 99 mg/dL   BUN 14 8 - 23 mg/dL   Creatinine, Ser 0.87 0.44 - 1.00 mg/dL   Calcium 8.2 (L) 8.9 - 10.3 mg/dL   GFR calc non Af Amer >60 >60 mL/min   GFR calc Af Amer >60 >60 mL/min   Anion gap 9 5 - 15    Comment: Performed at [facility], [place] [address] [place], [place] [address]  Basic metabolic panel     Status: Abnormal   Collection Time: 05/31/18  4:50 AM  Result Value Ref Range   Sodium 135 135 - 145 mmol/L   Potassium 3.2 (L) 3.5 - 5.1 mmol/L    Comment: DELTA CHECK NOTED   Chloride 102 98 - 111 mmol/L   CO2 29 22 - 32 mmol/L   Glucose, Bld 96 70 - 99 mg/dL   BUN 15 8 - 23 mg/dL   Creatinine, Ser 0.75 0.44 - 1.00 mg/dL   Calcium 7.8 (L) 8.9 - 10.3 mg/dL   GFR calc non Af Amer >60 >60 mL/min   GFR calc Af Amer >60 >60 mL/min   Anion  gap 4 (L) 5 - 15    Comment: Performed at [facility], [place] [address], [place], [place] [address]    Radiology/Results: Dg Abd Portable 1v  Result Date: 05/30/2018 CLINICAL DATA:  Follow-up small bowel obstruction EXAM: PORTABLE ABDOMEN - 1 VIEW COMPARISON:  05/29/2018 FINDINGS: Interval removal of the nasogastric tube. Mild gaseous distension of small bowel. Air is seen within the colon. Oral contrast material is present within the colon. There is no bowel dilatation to suggest obstruction. There is no evidence of pneumoperitoneum, portal venous gas or pneumatosis. There are no pathologic calcifications along the expected course of the ureters. The osseous structures are unremarkable. IMPRESSION: No bowel dilatation to suggest high-grade obstruction. Mild persistent gaseous distension of small bowel with air seen within the colon. Oral contrast material is again noted within the colon as well. Electronically Signed   By: Kathreen Devoid   On: 05/30/2018 13:15   Dg Abd Portable 1v  Result Date: 05/29/2018 CLINICAL DATA:  Small-bowel obstruction EXAM: PORTABLE ABDOMEN - 1 VIEW COMPARISON:  None. FINDINGS:  There is enteric contrast now within the ascending colon. Nasogastric tube tip and side port project over the gastric body. No dilated small bowel. IMPRESSION: Enteric contrast now within the ascending colon.No dilated bowel visualized. Electronically Signed   By: Ulyses Jarred M.D.   On: 05/29/2018 23:31    Anti-infectives: Anti-infectives (From admission, onward)   None      Assessment/Plan: Problem List: Patient Active Problem List   Diagnosis Date Noted  . Leukocytosis 05/29/2018  . GERD (gastroesophageal reflux disease) 05/29/2018  . Partial small bowel obstruction ([place]) 05/28/2018  . Coronary artery disease involving native coronary artery of native heart with angina pectoris ([place])   . Syncope 08/13/2017  . Angina pectoris ([place]) 03/14/2017  . Hyperlipidemia LDL  goal <70 03/14/2017  . Paroxysmal atrial fibrillation ([place]) 03/14/2017  . H/O class III angina pectoris 03/14/2017    OK to advance to full liquid diet.  * No surgery found *    LOS: 2 days   Matt B. Hassell Done, MD, [facility] [phone] beeper [phone]  05/31/2018 9:38 AM

## 2018-05-31 NOTE — Progress Notes (Signed)
PROGRESS NOTE    Sherry Barnett  ZCH:[id number] DOB: 11/07/1932 DOA: 05/28/2018 PCP: Lajean Manes, MD   Brief Narrative:84 y.o.femalewith medical history significant ofCAD, A. fib, HLD,anemia,diverticulosis, and CKDstage II/III; who presents with complaints of nausea and vomiting, found to have SBO Assessment & Plan:   Principal Problem:   Partial small bowel obstruction (HCC) Active Problems:   Paroxysmal atrial fibrillation (HCC)   Coronary artery disease involving native coronary artery of native heart with angina pectoris (HCC)   Leukocytosis   GERD (gastroesophageal reflux disease)  Small bowel obstruction: NGtube DC'd  patient still on clear liquids advance diet as tolerated per surgery.  Leukocytosis:no s/s of systemic infection.appears to be downward trending. Suspect secondary to above. Resolved.  History ofCAD. Stent (03/2017). No acute cardiopulmonary symptoms.  Restarted cardiac medications including Plavix.  Paroxysmal atrial fibrillation:not on anticoagulation at home. HR is stable.  GERD.Protonix IV  Hypokalemia. Replace. Monitor          Estimated body mass index is 25.56 kg/m as calculated from the following:   Height as of this encounter: 5' 2.5" (1.588 m).   Weight as of this encounter: 64.4 kg.  DVT prophylaxis: lovenox Code Status: Full code Family Communication discussed with husband who was in the room Disposition Plan: Pending clinical improvement patient still on liquids has not had any solid food and needs surgical clearance prior to discharge Consultants:  General surgery  Procedures: NG tube Antimicrobials: None  Subjective: Still not had anything to eat had some water last night no appetite afraid to eat  Objective: Vitals:   05/30/18 1439 05/30/18 2204 05/31/18 0505 05/31/18 1341  BP: (!) 133/50 (!) 115/44 (!) 135/46 137/76  Pulse: 62 63 68 69  Resp: 14 16 18 14   Temp: 98.5 F (36.9 C) 99.4 F (37.4 C)  97.8 F (36.6 C) 97.6 F (36.4 C)  TempSrc:  Oral Oral Oral  SpO2: 98% 98% 98% 99%  Weight:      Height:        Intake/Output Summary (Last 24 hours) at 05/31/2018 1409 Last data filed at 05/31/2018 1355 Gross per 24 hour  Intake 1833.2 ml  Output 1400 ml  Net 433.2 ml   Filed Weights   05/28/18 1504 05/28/18 2218  Weight: 62.1 kg 64.4 kg    Examination:  General exam: Appears calm and comfortable  Respiratory system: Clear to auscultation. Respiratory effort normal. Cardiovascular system: S1 & S2 heard, RRR. No JVD, murmurs, rubs, gallops or clicks. No pedal edema. Gastrointestinal system: Abdomen is nondistended, soft and nontender. No organomegaly or masses felt. Normal bowel sounds heard. Central nervous system: Alert and oriented. No focal neurological deficits. Extremities: Symmetric 5 x 5 power. Skin: No rashes, lesions or ulcers Psychiatry: Judgement and insight appear normal. Mood & affect appropriate.     Data Reviewed: I have personally reviewed following labs and imaging studies  CBC: Recent Labs  Lab 05/27/18 0258 05/28/18 1600 05/29/18 0525  WBC 11.1* 10.8* 10.7*  NEUTROABS 8.9*  --   --   HGB 13.2 13.3 12.7  HCT 41.2 41.6 40.2  MCV 96.7 97.4 98.8  PLT 231 216 287   Basic Metabolic Panel: Recent Labs  Lab 05/27/18 0258 05/28/18 1600 05/29/18 0525 05/30/18 0521 05/31/18 0450  NA 139 136 139 139 135  K 3.9 3.7 3.3* 4.4 3.2*  CL 100 99 107 104 102  CO2 28 26 23 26 29   GLUCOSE 130* 109* 92 141* 96  BUN 16 16 16  14  15  CREATININE 0.84 0.91 0.83 0.87 0.75  CALCIUM 9.4 8.9 8.5* 8.2* 7.8*  MG  --   --  2.3  --   --    GFR: Estimated Creatinine Clearance: 47.5 mL/min (by C-G formula based on SCr of 0.75 mg/dL). Liver Function Tests: Recent Labs  Lab 05/27/18 0258 05/28/18 1600  AST 28 28  ALT 32 27  ALKPHOS 70 61  BILITOT 0.5 1.1  PROT 6.6 6.3*  ALBUMIN 4.1 4.0   Recent Labs  Lab 05/27/18 0258 05/28/18 1600  LIPASE 29 27   No  results for input(s): AMMONIA in the last 168 hours. Coagulation Profile: No results for input(s): INR, PROTIME in the last 168 hours. Cardiac Enzymes: No results for input(s): CKTOTAL, CKMB, CKMBINDEX, TROPONINI in the last 168 hours. BNP (last 3 results) No results for input(s): PROBNP in the last 8760 hours. HbA1C: No results for input(s): HGBA1C in the last 72 hours. CBG: No results for input(s): GLUCAP in the last 168 hours. Lipid Profile: No results for input(s): CHOL, HDL, LDLCALC, TRIG, CHOLHDL, LDLDIRECT in the last 72 hours. Thyroid Function Tests: No results for input(s): TSH, T4TOTAL, FREET4, T3FREE, THYROIDAB in the last 72 hours. Anemia Panel: No results for input(s): VITAMINB12, FOLATE, FERRITIN, TIBC, IRON, RETICCTPCT in the last 72 hours. Sepsis Labs: No results for input(s): PROCALCITON, LATICACIDVEN in the last 168 hours.  No results found for this or any previous visit (from the past 240 hour(s)).       Radiology Studies: Dg Abd Portable 1v  Result Date: 05/30/2018 CLINICAL DATA:  Follow-up small bowel obstruction EXAM: PORTABLE ABDOMEN - 1 VIEW COMPARISON:  05/29/2018 FINDINGS: Interval removal of the nasogastric tube. Mild gaseous distension of small bowel. Air is seen within the colon. Oral contrast material is present within the colon. There is no bowel dilatation to suggest obstruction. There is no evidence of pneumoperitoneum, portal venous gas or pneumatosis. There are no pathologic calcifications along the expected course of the ureters. The osseous structures are unremarkable. IMPRESSION: No bowel dilatation to suggest high-grade obstruction. Mild persistent gaseous distension of small bowel with air seen within the colon. Oral contrast material is again noted within the colon as well. Electronically Signed   By: Kathreen Devoid   On: 05/30/2018 13:15   Dg Abd Portable 1v  Result Date: 05/29/2018 CLINICAL DATA:  Small-bowel obstruction EXAM: PORTABLE  ABDOMEN - 1 VIEW COMPARISON:  None. FINDINGS: There is enteric contrast now within the ascending colon. Nasogastric tube tip and side port project over the gastric body. No dilated small bowel. IMPRESSION: Enteric contrast now within the ascending colon.No dilated bowel visualized. Electronically Signed   By: Ulyses Jarred M.D.   On: 05/29/2018 23:31        Scheduled Meds: . atorvastatin  80 mg Oral QPM  . docusate sodium  100 mg Oral BID  . enoxaparin (LOVENOX) injection  40 mg Subcutaneous QHS  . isosorbide mononitrate  60 mg Oral Daily  . metoprolol succinate  50 mg Oral QAC breakfast  . pantoprazole (PROTONIX) IV  40 mg Intravenous Q12H  . sodium chloride flush  3 mL Intravenous Q12H  . tobramycin-dexamethasone  1 application Both Eyes Once per day on Sun Mon Wed Thu Fri   Continuous Infusions:   LOS: 2 days     Georgette Shell, MD [facility]  If 7PM-7AM, please contact night-coverage [email or web address] Password [email or web address] 05/31/2018, 2:09 PM

## 2018-05-31 NOTE — Plan of Care (Signed)
Pt stable at this time. Pt continues to tolerate sips of liquids well and is now attempting chicken noodle soup. Pt has had several bowel movements today. No needs at this time. No changes needed to care plans.

## 2018-05-31 NOTE — Plan of Care (Signed)
  Problem: Health Behavior/Discharge Planning: Goal: Ability to manage health-related needs will improve Outcome: Progressing   Problem: Clinical Measurements: Goal: Will remain free from infection Outcome: Progressing   Problem: Clinical Measurements: Goal: Cardiovascular complication will be avoided Outcome: Progressing

## 2018-06-01 NOTE — Discharge Summary (Signed)
Physician Discharge Summary  Sherry Barnett:[id number] DOB: 23-Aug-1932 DOA: 05/28/2018  PCP: Lajean Manes, MD  Admit date: 05/28/2018 Discharge date: 06/01/2018  Admitted From: Disposition: Home home Recommendations for Outpatient Follow-up:  1. Follow up with PCP in 1-2 weeks 2. Please obtain BMP/CBC in one week  Home Health: None Equipment/Devices none  Discharge Condition stable CODE STATUS full code Diet recommendation: Cardiac diet advance as tolerated Brief/Interim Summary:84 y.o.femalewith medical history significant ofCAD, A. fib, HLD,anemia,diverticulosis, and CKDstage II/III; who presents with complaints of nausea and vomiting, found to have SBO   Discharge Diagnoses:  Principal Problem:   Partial small bowel obstruction (HCC) Active Problems:   Paroxysmal atrial fibrillation ([place])   Coronary artery disease involving native coronary artery of native heart with angina pectoris (HCC)   Leukocytosis   GERD (gastroesophageal reflux disease)   Small bowel obstruction: Patient was initially treated with NG tube to low intermittent suction and daily KUBs.  Eventually the tube was taken out and she was started on clear diet which she tolerated well.  Patient also had multiple bowel movements during this hospital stay.  Leukocytosis:no s/s of systemic infection.appears to be downward trending. Suspect secondary to above. Resolved.  History ofCAD. Stent No acute cardiopulmonary symptoms.  Restarted cardiac medications including Plavix.  Paroxysmal atrial fibrillation:not on anticoagulation at home. HR is stable.  GERD. Take Pepcid and Protonix at home continue  Hypokalemia. Replaced     Estimated body mass index is 25.56 kg/m as calculated from the following:   Height as of this encounter: 5' 2.5" (1.588 m).   Weight as of this encounter: 64.4 kg.  Discharge Instructions   Allergies as of 06/01/2018      Reactions   Sulfa Antibiotics  Other (See Comments)   HEMATURIA   Novocain [procaine] Palpitations   Penicillins Rash   Has patient had a PCN reaction causing immediate rash, facial/tongue/throat swelling, SOB or lightheadedness with hypotension: Yes Has patient had a PCN reaction causing severe rash involving mucus membranes or skin necrosis: Unknown Has patient had a PCN reaction that required hospitalization: No Has patient had a PCN reaction occurring within the last 10 years: No If all of the above answers are "NO", then may proceed with Cephalosporin use.      Medication List    TAKE these medications   alendronate 70 MG tablet Commonly known as:  FOSAMAX Take 70 mg by mouth every Saturday. Take with a full glass of water on an empty stomach.   artificial tears ointment Place 1 drop into both eyes 4 (four) times daily as needed (dry eyes). Refresh Tears for dry eyes.   aspirin 81 MG EC tablet Take 81 mg by mouth daily with breakfast. Swallow whole.   atorvastatin 80 MG tablet Commonly known as:  LIPITOR TAKE 1 TABLET BY MOUTH EVERY DAY AT 6:00 PM What changed:  See the new instructions.   calcium carbonate 750 MG chewable tablet Commonly known as:  TUMS EX Chew 1-2 tablets by mouth 2 (two) times daily as needed (for hearburn/indigestion.).   cholecalciferol 1000 units tablet Commonly known as:  VITAMIN D Take 1,000 Units by mouth daily.   clopidogrel 75 MG tablet Commonly known as:  PLAVIX TAKE 1 TABLET BY MOUTH DAILY   famotidine 20 MG tablet Commonly known as:  PEPCID Take 1 tablet (20 mg total) by mouth 2 (two) times daily.   FINACEA 15 % cream Generic drug:  Azelaic Acid Apply 1 application topically 2 (two) times  daily. After skin is thoroughly washed and patted dry, gently but thoroughly massage a thin film of azelaic acid cream into the affected area twice daily, in the morning and evening.   isosorbide mononitrate 60 MG 24 hr tablet Commonly known as:  IMDUR Take 1 tablet (60 mg  total) by mouth daily.   metoprolol succinate 50 MG 24 hr tablet Commonly known as:  TOPROL-XL Take 1 tablet (50 mg total) by mouth daily. Take with or immediately following a meal.   nitroGLYCERIN 0.4 MG SL tablet Commonly known as:  NITROSTAT Place 1 tablet (0.4 mg total) under the tongue every 5 (five) minutes as needed for chest pain.   ondansetron 8 MG disintegrating tablet Commonly known as:  ZOFRAN-ODT Take 8 mg by mouth every 12 (twelve) hours as needed for nausea or vomiting.   pantoprazole 40 MG tablet Commonly known as:  PROTONIX Take 40 mg by mouth daily as needed (acid reflux).   sucralfate 1 g tablet Commonly known as:  CARAFATE Take 1 tablet (1 g total) by mouth 4 (four) times daily.   tobramycin-dexamethasone ophthalmic ointment Commonly known as:  TOBRADEX Place 1 application into both eyes See admin instructions. Tuesday AND Saturday patient does not use. Will apply at night on all other nights.      Follow-up Information    Lajean Manes, MD Follow up.   Specialty:  Internal Medicine Contact information: [address] [place] [address] [phone]        Belva Crome, MD .   Specialty:  Cardiology Contact information: [phone] [place] [address] [phone]          Allergies  Allergen Reactions  . Sulfa Antibiotics Other (See Comments)    HEMATURIA  . Novocain [Procaine] Palpitations  . Penicillins Rash    Has patient had a PCN reaction causing immediate rash, facial/tongue/throat swelling, SOB or lightheadedness with hypotension: Yes Has patient had a PCN reaction causing severe rash involving mucus membranes or skin necrosis: Unknown Has patient had a PCN reaction that required hospitalization: No Has patient had a PCN reaction occurring within the last 10 years: No If all of the above answers are "NO", then may proceed with Cephalosporin use.     Consultations:  General  surgery   Procedures/Studies: Ct Abdomen Pelvis W Contrast  Result Date: 05/28/2018 CLINICAL DATA:  Abdominal pain, distention, and nausea and vomiting for several days. Previous appendectomy. EXAM: CT ABDOMEN AND PELVIS WITH CONTRAST TECHNIQUE: Multidetector CT imaging of the abdomen and pelvis was performed using the standard protocol following bolus administration of intravenous contrast. CONTRAST:  171mL ISOVUE-300 IOPAMIDOL (ISOVUE-300) INJECTION 61% COMPARISON:  None. FINDINGS: Lower Chest: No acute findings. Hepatobiliary: No hepatic masses identified. Probable tiny sub-cm cyst in right hepatic lobe. Gallbladder is unremarkable. No evidence of biliary ductal dilatation. Pancreas:  No mass or inflammatory changes. Spleen: Within normal limits in size and appearance. Adrenals/Urinary Tract: No masses identified. Probable tiny sub-cm left renal cyst noted. No evidence of hydronephrosis. Unremarkable unopacified urinary bladder. Stomach/Bowel: Diverticulosis is seen involving the sigmoid colon, however there is no evidence of diverticulitis. Mild dilatation of multiple mid small bowel loops are seen, with transition point in the anterior mid pelvis (image 28/5). No mass or inflammatory process identified at this transition point, suggesting an adhesion. Vascular/Lymphatic: No pathologically enlarged lymph nodes. No abdominal aortic aneurysm. Aortic atherosclerosis. Reproductive:  No mass or other significant abnormality. Other: Small amount of free fluid  seen within the pelvis and bilateral upper quadrants, but no abscess identified. No evidence of free intraperitoneal or other extraluminal air. Musculoskeletal: No suspicious bone lesions identified. Lumbar spine degenerative changes noted. IMPRESSION: Findings consistent with partial small bowel obstruction, with transition point in the anterior mid pelvis suspicious for adhesion. Small amount of free fluid. No evidence of abscess or free intraperitoneal  air. Sigmoid diverticulosis, without radiographic evidence of diverticulitis. Electronically Signed   By: Earle Gell M.D.   On: 05/28/2018 19:14   Dg Abd Acute W/chest  Result Date: 05/27/2018 CLINICAL DATA:  Vomiting and abdominal pain. EXAM: DG ABDOMEN ACUTE W/ 1V CHEST COMPARISON:  None. FINDINGS: The cardiomediastinal contours are normal. The lungs are clear. There is no free intra-abdominal air. No dilated bowel loops to suggest obstruction. Small volume of stool throughout the colon. Calcifications in the left upper quadrant may be vascular but are poorly localized. No acute osseous abnormalities are seen. IMPRESSION: 1. Normal bowel gas pattern. No free air. 2. Rounded calcifications projecting over the left upper quadrant are poorly localized radiographically, may be vascular. 3. No acute pulmonary process. Electronically Signed   By: Keith Rake M.D.   On: 05/27/2018 01:40   Dg Abd Portable 1v  Result Date: 05/30/2018 CLINICAL DATA:  Follow-up small bowel obstruction EXAM: PORTABLE ABDOMEN - 1 VIEW COMPARISON:  05/29/2018 FINDINGS: Interval removal of the nasogastric tube. Mild gaseous distension of small bowel. Air is seen within the colon. Oral contrast material is present within the colon. There is no bowel dilatation to suggest obstruction. There is no evidence of pneumoperitoneum, portal venous gas or pneumatosis. There are no pathologic calcifications along the expected course of the ureters. The osseous structures are unremarkable. IMPRESSION: No bowel dilatation to suggest high-grade obstruction. Mild persistent gaseous distension of small bowel with air seen within the colon. Oral contrast material is again noted within the colon as well. Electronically Signed   By: Kathreen Devoid   On: 05/30/2018 13:15   Dg Abd Portable 1v  Result Date: 05/29/2018 CLINICAL DATA:  Small-bowel obstruction EXAM: PORTABLE ABDOMEN - 1 VIEW COMPARISON:  None. FINDINGS: There is enteric contrast now  within the ascending colon. Nasogastric tube tip and side port project over the gastric body. No dilated small bowel. IMPRESSION: Enteric contrast now within the ascending colon.No dilated bowel visualized. Electronically Signed   By: Ulyses Jarred M.D.   On: 05/29/2018 23:31   Dg Abd Portable 1v-small Bowel Obstruction Protocol-initial, 8 Hr Delay  Result Date: 05/29/2018 CLINICAL DATA:  Small bowel obstruction. EXAM: PORTABLE ABDOMEN - 1 VIEW COMPARISON:  05/28/2018 FINDINGS: NG tube is noted in the stomach. Contrast material from prior CT noted in the urinary bladder. Contrast noted within small bowel loops. Dilated small bowel loops. No organomegaly or free air. Gas and stool noted within nondistended colon. IMPRESSION: Gas and oral contrast material noted within the small bowel which is dilated compatible with small bowel obstruction. No definite passage of contrast material into the colon. Electronically Signed   By: Rolm Baptise M.D.   On: 05/29/2018 07:30   Dg Abd Portable 1 View  Result Date: 05/28/2018 CLINICAL DATA:  NG tube placement EXAM: PORTABLE ABDOMEN - 1 VIEW COMPARISON:  05/27/2018 radiographs and CT abdomen and pelvis 05/28/2018 FINDINGS: A gastric tube is seen with tip and side port below the left hemidiaphragm in the expected location of the stomach. Opacification of the renal collecting system and included bladder from recent contrast administration. Mild levoconvex curvature of  the lumbar spine with degenerative disc and facet arthropathy. Mild diffuse gaseous distention of small bowel loops in the lower abdomen pelvis compatible with findings on recent CT and suggestive of partial SBO. IMPRESSION: No significant change from 05/27/2018. Electronically Signed   By: Ashley Royalty M.D.   On: 05/28/2018 20:41   (Echo, Carotid, EGD, Colonoscopy, ERCP)    Subjective:   Discharge Exam: Vitals:   05/31/18 2140 06/01/18 0517  BP: (!) 135/107 (!) 127/44  Pulse: 65 (!) 59  Resp: 16  14  Temp: 98.7 F (37.1 C) 98.2 F (36.8 C)  SpO2: 96% 100%   Vitals:   05/31/18 0505 05/31/18 1341 05/31/18 2140 06/01/18 0517  BP: (!) 135/46 137/76 (!) 135/107 (!) 127/44  Pulse: 68 69 65 (!) 59  Resp: 18 14 16 14   Temp: 97.8 F (36.6 C) 97.6 F (36.4 C) 98.7 F (37.1 C) 98.2 F (36.8 C)  TempSrc: Oral Oral Oral Oral  SpO2: 98% 99% 96% 100%  Weight:      Height:        General: Pt is alert, awake, not in acute distress Cardiovascular: RRR, S1/S2 +, no rubs, no gallops Respiratory: CTA bilaterally, no wheezing, no rhonchi Abdominal: Soft, NT, ND, bowel sounds + Extremities: no edema, no cyanosis    The results of significant diagnostics from this hospitalization (including imaging, microbiology, ancillary and laboratory) are listed below for reference.     Microbiology: No results found for this or any previous visit (from the past 240 hour(s)).   Labs: BNP (last 3 results) No results for input(s): BNP in the last 8760 hours. Basic Metabolic Panel: Recent Labs  Lab 05/27/18 0258 05/28/18 1600 05/29/18 0525 05/30/18 0521 05/31/18 0450  NA 139 136 139 139 135  K 3.9 3.7 3.3* 4.4 3.2*  CL 100 99 107 104 102  CO2 28 26 23 26 29   GLUCOSE 130* 109* 92 141* 96  BUN 16 16 16 14 15   CREATININE 0.84 0.91 0.83 0.87 0.75  CALCIUM 9.4 8.9 8.5* 8.2* 7.8*  MG  --   --  2.3  --   --    Liver Function Tests: Recent Labs  Lab 05/27/18 0258 05/28/18 1600  AST 28 28  ALT 32 27  ALKPHOS 70 61  BILITOT 0.5 1.1  PROT 6.6 6.3*  ALBUMIN 4.1 4.0   Recent Labs  Lab 05/27/18 0258 05/28/18 1600  LIPASE 29 27   No results for input(s): AMMONIA in the last 168 hours. CBC: Recent Labs  Lab 05/27/18 0258 05/28/18 1600 05/29/18 0525  WBC 11.1* 10.8* 10.7*  NEUTROABS 8.9*  --   --   HGB 13.2 13.3 12.7  HCT 41.2 41.6 40.2  MCV 96.7 97.4 98.8  PLT 231 216 200   Cardiac Enzymes: No results for input(s): CKTOTAL, CKMB, CKMBINDEX, TROPONINI in the last 168  hours. BNP: Invalid input(s): POCBNP CBG: No results for input(s): GLUCAP in the last 168 hours. D-Dimer No results for input(s): DDIMER in the last 72 hours. Hgb A1c No results for input(s): HGBA1C in the last 72 hours. Lipid Profile No results for input(s): CHOL, HDL, LDLCALC, TRIG, CHOLHDL, LDLDIRECT in the last 72 hours. Thyroid function studies No results for input(s): TSH, T4TOTAL, T3FREE, THYROIDAB in the last 72 hours.  Invalid input(s): FREET3 Anemia work up No results for input(s): VITAMINB12, FOLATE, FERRITIN, TIBC, IRON, RETICCTPCT in the last 72 hours. Urinalysis    Component Value Date/Time   COLORURINE YELLOW 05/28/2018 1755  APPEARANCEUR CLEAR 05/28/2018 1755   LABSPEC 1.018 05/28/2018 1755   PHURINE 6.0 05/28/2018 1755   GLUCOSEU NEGATIVE 05/28/2018 1755   HGBUR LARGE (A) 05/28/2018 [place] 05/28/2018 1755   KETONESUR 80 (A) 05/28/2018 1755   PROTEINUR NEGATIVE 05/28/2018 1755   NITRITE NEGATIVE 05/28/2018 1755   LEUKOCYTESUR TRACE (A) 05/28/2018 1755   Sepsis Labs Invalid input(s): PROCALCITONIN,  WBC,  LACTICIDVEN Microbiology No results found for this or any previous visit (from the past 240 hour(s)).   Time coordinating discharge: 33  minutes  SIGNED:   Georgette Shell, MD  [facility] 06/01/2018, 11:51 AM Pager   If 7PM-7AM, please contact night-coverage [email or web address] Password [email or web address]

## 2018-06-01 NOTE — Progress Notes (Signed)
Patient ID: Sherry Barnett, female   DOB: [birth year], 84 y.o.   MRN: [id number] [facility] Progress Note:   * No surgery found *  Subjective: Mental status is clear;  Trying to eating big breakfast Objective: Vital signs in last 24 hours: Temp:  [97.6 F (36.4 C)-98.7 F (37.1 C)] 98.2 F (36.8 C) (12/22 0517) Pulse Rate:  [59-69] 59 (12/22 0517) Resp:  [14-16] 14 (12/22 0517) BP: (127-137)/(44-107) 127/44 (12/22 0517) SpO2:  [96 %-100 %] 100 % (12/22 0517)  Intake/Output from previous day: 12/21 0701 - 12/22 0700 In: 1630.2 [P.O.:1380; IV Piggyback:250.2] Out: 2000 [Urine:2000] Intake/Output this shift: Total I/O In: 360 [P.O.:360] Out: -   Physical Exam: Work of breathing is normal;  Passing flatus and loose BM.  Tolerating liquids  Lab Results:  Results for orders placed or performed during the hospital encounter of 05/28/18 (from the past 48 hour(s))  Basic metabolic panel     Status: Abnormal   Collection Time: 05/31/18  4:50 AM  Result Value Ref Range   Sodium 135 135 - 145 mmol/L   Potassium 3.2 (L) 3.5 - 5.1 mmol/L    Comment: DELTA CHECK NOTED   Chloride 102 98 - 111 mmol/L   CO2 29 22 - 32 mmol/L   Glucose, Bld 96 70 - 99 mg/dL   BUN 15 8 - 23 mg/dL   Creatinine, Ser 0.75 0.44 - 1.00 mg/dL   Calcium 7.8 (L) 8.9 - 10.3 mg/dL   GFR calc non Af Amer >60 >60 mL/min   GFR calc Af Amer >60 >60 mL/min   Anion gap 4 (L) 5 - 15    Comment: Performed at [facility], [place] [address] [place], [place] [address]    Radiology/Results: Dg Abd Portable 1v  Result Date: 05/30/2018 CLINICAL DATA:  Follow-up small bowel obstruction EXAM: PORTABLE ABDOMEN - 1 VIEW COMPARISON:  05/29/2018 FINDINGS: Interval removal of the nasogastric tube. Mild gaseous distension of small bowel. Air is seen within the colon. Oral contrast material is present within the colon. There is no bowel dilatation to suggest obstruction. There is no evidence of  pneumoperitoneum, portal venous gas or pneumatosis. There are no pathologic calcifications along the expected course of the ureters. The osseous structures are unremarkable. IMPRESSION: No bowel dilatation to suggest high-grade obstruction. Mild persistent gaseous distension of small bowel with air seen within the colon. Oral contrast material is again noted within the colon as well. Electronically Signed   By: Kathreen Devoid   On: 05/30/2018 13:15    Anti-infectives: Anti-infectives (From admission, onward)   None      Assessment/Plan: Problem List: Patient Active Problem List   Diagnosis Date Noted  . Leukocytosis 05/29/2018  . GERD (gastroesophageal reflux disease) 05/29/2018  . Partial small bowel obstruction ([place]) 05/28/2018  . Coronary artery disease involving native coronary artery of native heart with angina pectoris ([place])   . Syncope 08/13/2017  . Angina pectoris ([place]) 03/14/2017  . Hyperlipidemia LDL goal <70 03/14/2017  . Paroxysmal atrial fibrillation ([place]) 03/14/2017  . H/O class III angina pectoris 03/14/2017    If she tolerates liquids it is OK for discharge.   * No surgery found *    LOS: 3 days   Matt B. Hassell Done, MD, [facility] [phone] beeper [phone]  06/01/2018 9:06 AM

## 2018-06-01 NOTE — Plan of Care (Signed)
Pt stable this am with no needs. Pt tolerated half a piece of toast, egss, oatmeal, juice, and coffee this am. Family at bedside. No s/s of distress or pain at this time. Mds have rounded on pt and will send pt home today is she tolerates food well.

## 2018-06-01 NOTE — Progress Notes (Signed)
Pt stable at time of d/c. No needs at d/c. Pt tolerated her lunch well. No s/s distress or pain.

## 2018-06-02 ENCOUNTER — Observation Stay (HOSPITAL_COMMUNITY)
Admission: EM | Admit: 2018-06-02 | Discharge: 2018-06-03 | Disposition: A | Discharge status: Home / Self Care | Payer: Medicare Other | Attending: Internal Medicine | Admitting: Internal Medicine

## 2018-06-02 ENCOUNTER — Emergency Department (HOSPITAL_COMMUNITY): Payer: Medicare Other

## 2018-06-02 ENCOUNTER — Other Ambulatory Visit: Payer: Self-pay

## 2018-06-02 ENCOUNTER — Encounter (HOSPITAL_COMMUNITY): Payer: Self-pay | Admitting: Emergency Medicine

## 2018-06-02 DIAGNOSIS — Z880 Allergy status to penicillin: Secondary | ICD-10-CM | POA: Insufficient documentation

## 2018-06-02 DIAGNOSIS — I25119 Atherosclerotic heart disease of native coronary artery with unspecified angina pectoris: Secondary | ICD-10-CM | POA: Diagnosis not present

## 2018-06-02 DIAGNOSIS — Z7902 Long term (current) use of antithrombotics/antiplatelets: Secondary | ICD-10-CM | POA: Diagnosis not present

## 2018-06-02 DIAGNOSIS — K219 Gastro-esophageal reflux disease without esophagitis: Secondary | ICD-10-CM | POA: Insufficient documentation

## 2018-06-02 DIAGNOSIS — R110 Nausea: Secondary | ICD-10-CM | POA: Diagnosis not present

## 2018-06-02 DIAGNOSIS — Z882 Allergy status to sulfonamides status: Secondary | ICD-10-CM | POA: Diagnosis not present

## 2018-06-02 DIAGNOSIS — Z87891 Personal history of nicotine dependence: Secondary | ICD-10-CM | POA: Diagnosis not present

## 2018-06-02 DIAGNOSIS — I959 Hypotension, unspecified: Secondary | ICD-10-CM | POA: Diagnosis not present

## 2018-06-02 DIAGNOSIS — I4891 Unspecified atrial fibrillation: Secondary | ICD-10-CM | POA: Diagnosis not present

## 2018-06-02 DIAGNOSIS — Z955 Presence of coronary angioplasty implant and graft: Secondary | ICD-10-CM | POA: Insufficient documentation

## 2018-06-02 DIAGNOSIS — R531 Weakness: Secondary | ICD-10-CM | POA: Diagnosis not present

## 2018-06-02 DIAGNOSIS — R0602 Shortness of breath: Secondary | ICD-10-CM | POA: Diagnosis not present

## 2018-06-02 DIAGNOSIS — R000 Tachycardia, unspecified: Secondary | ICD-10-CM | POA: Diagnosis not present

## 2018-06-02 DIAGNOSIS — E785 Hyperlipidemia, unspecified: Secondary | ICD-10-CM | POA: Insufficient documentation

## 2018-06-02 DIAGNOSIS — Z8249 Family history of ischemic heart disease and other diseases of the circulatory system: Secondary | ICD-10-CM | POA: Diagnosis not present

## 2018-06-02 DIAGNOSIS — H1131 Conjunctival hemorrhage, right eye: Secondary | ICD-10-CM | POA: Diagnosis not present

## 2018-06-02 DIAGNOSIS — I480 Paroxysmal atrial fibrillation: Secondary | ICD-10-CM | POA: Diagnosis not present

## 2018-06-02 DIAGNOSIS — M8580 Other specified disorders of bone density and structure, unspecified site: Secondary | ICD-10-CM | POA: Insufficient documentation

## 2018-06-02 DIAGNOSIS — R0789 Other chest pain: Secondary | ICD-10-CM | POA: Diagnosis not present

## 2018-06-02 DIAGNOSIS — Z7982 Long term (current) use of aspirin: Secondary | ICD-10-CM | POA: Insufficient documentation

## 2018-06-02 DIAGNOSIS — I2510 Atherosclerotic heart disease of native coronary artery without angina pectoris: Secondary | ICD-10-CM | POA: Diagnosis present

## 2018-06-02 DIAGNOSIS — Z79899 Other long term (current) drug therapy: Secondary | ICD-10-CM | POA: Diagnosis not present

## 2018-06-02 DIAGNOSIS — R079 Chest pain, unspecified: Secondary | ICD-10-CM | POA: Diagnosis not present

## 2018-06-02 DIAGNOSIS — E876 Hypokalemia: Secondary | ICD-10-CM | POA: Diagnosis not present

## 2018-06-02 LAB — COMPREHENSIVE METABOLIC PANEL
ALT: 21 U/L (ref 0–44)
AST: 29 U/L (ref 15–41)
Albumin: 3.2 g/dL — ABNORMAL LOW (ref 3.5–5.0)
Alkaline Phosphatase: 58 U/L (ref 38–126)
Anion gap: 17 — ABNORMAL HIGH (ref 5–15)
BUN: 9 mg/dL (ref 8–23)
CO2: 21 mmol/L — ABNORMAL LOW (ref 22–32)
Calcium: 8.7 mg/dL — ABNORMAL LOW (ref 8.9–10.3)
Chloride: 100 mmol/L (ref 98–111)
Creatinine, Ser: 0.77 mg/dL (ref 0.44–1.00)
GFR calc Af Amer: >60 mL/min (ref >60)
Glucose, Bld: 109 mg/dL — ABNORMAL HIGH (ref 70–99)
Potassium: 3.1 mmol/L — ABNORMAL LOW (ref 3.5–5.1)
Sodium: 138 mmol/L (ref 135–145)
Total Bilirubin: 1.1 mg/dL (ref 0.3–1.2)
Total Protein: 6 g/dL — ABNORMAL LOW (ref 6.5–8.1)

## 2018-06-02 LAB — CBC WITH DIFFERENTIAL/PLATELET
Abs Immature Granulocytes: 0.03 10*3/uL (ref 0.00–0.07)
Basophils Absolute: 0 10*3/uL (ref 0.0–0.1)
Basophils Relative: 0 %
EOS ABS: 0.3 10*3/uL (ref 0.0–0.5)
EOS PCT: 3 %
HCT: 41.4 % (ref 36.0–46.0)
Hemoglobin: 13.7 g/dL (ref 12.0–15.0)
Immature Granulocytes: 0 %
Lymphocytes Relative: 16 %
Lymphs Abs: 1.5 10*3/uL (ref 0.7–4.0)
MCH: 31.5 pg (ref 26.0–34.0)
MCHC: 33.1 g/dL (ref 30.0–36.0)
MCV: 95.2 fL (ref 80.0–100.0)
Monocytes Absolute: 1.2 10*3/uL — ABNORMAL HIGH (ref 0.1–1.0)
Monocytes Relative: 13 %
Neutro Abs: 6.7 10*3/uL (ref 1.7–7.7)
Neutrophils Relative %: 68 %
Platelets: 195 10*3/uL (ref 150–400)
RBC: 4.35 MIL/uL (ref 3.87–5.11)
RDW: 13.1 % (ref 11.5–15.5)
WBC: 9.9 10*3/uL (ref 4.0–10.5)
nRBC: 0 % (ref 0.0–0.2)

## 2018-06-02 LAB — I-STAT TROPONIN, ED: TROPONIN I, POC: 0.06 ng/mL (ref 0.00–0.08)

## 2018-06-02 LAB — TROPONIN I
Troponin I: 0.13 ng/mL (ref <0.03)
Troponin I: 0.09 ng/mL (ref <0.03)

## 2018-06-02 MED ORDER — ASPIRIN EC 81 MG PO TBEC
81.0000 mg | DELAYED_RELEASE_TABLET | Freq: Every day | ORAL | Status: DC
  Administered 2018-06-02 – 2018-06-03 (×2): 81 mg via ORAL
  Filled 2018-06-02 (×3): qty 1

## 2018-06-02 MED ORDER — ASPIRIN 300 MG RE SUPP: 300.0000 mg | RECTAL | Status: DC

## 2018-06-02 MED ORDER — ISOSORBIDE MONONITRATE ER 30 MG PO TB24
60.0000 mg | ORAL_TABLET | Freq: Every day | ORAL | Status: DC
  Administered 2018-06-02 – 2018-06-03 (×2): 60 mg via ORAL
  Filled 2018-06-02 (×3): qty 2

## 2018-06-02 MED ORDER — PANTOPRAZOLE SODIUM 40 MG PO TBEC
40.0000 mg | DELAYED_RELEASE_TABLET | Freq: Every day | ORAL | Status: DC
  Administered 2018-06-02 – 2018-06-03 (×2): 40 mg via ORAL
  Filled 2018-06-02 (×3): qty 1

## 2018-06-02 MED ORDER — METOPROLOL SUCCINATE ER 50 MG PO TB24
50.0000 mg | ORAL_TABLET | Freq: Every day | ORAL | Status: DC
  Administered 2018-06-02 – 2018-06-03 (×2): 50 mg via ORAL
  Filled 2018-06-02 (×3): qty 1

## 2018-06-02 MED ORDER — CLOPIDOGREL BISULFATE 75 MG PO TABS
75.0000 mg | ORAL_TABLET | Freq: Every day | ORAL | Status: DC
  Administered 2018-06-02 – 2018-06-03 (×2): 75 mg via ORAL
  Filled 2018-06-02 (×3): qty 1

## 2018-06-02 MED ORDER — POTASSIUM CHLORIDE CRYS ER 20 MEQ PO TBCR
40.0000 meq | EXTENDED_RELEASE_TABLET | Freq: Once | ORAL | Status: AC
  Administered 2018-06-02: 40 meq via ORAL
  Filled 2018-06-02: qty 2

## 2018-06-02 MED ORDER — ACETAMINOPHEN 325 MG PO TABS: 650.0000 mg | ORAL_TABLET | ORAL | Status: DC | PRN

## 2018-06-02 MED ORDER — FAMOTIDINE 20 MG PO TABS
20.0000 mg | ORAL_TABLET | Freq: Two times a day (BID) | ORAL | Status: DC
  Administered 2018-06-02 – 2018-06-03 (×3): 20 mg via ORAL
  Filled 2018-06-02 (×4): qty 1

## 2018-06-02 MED ORDER — ENOXAPARIN SODIUM 40 MG/0.4ML ~~LOC~~ SOLN
40.0000 mg | Freq: Every day | SUBCUTANEOUS | Status: DC
  Administered 2018-06-02 – 2018-06-03 (×2): 40 mg via SUBCUTANEOUS
  Filled 2018-06-02 (×2): qty 0.4

## 2018-06-02 MED ORDER — ASPIRIN 81 MG PO CHEW: 324.0000 mg | CHEWABLE_TABLET | ORAL | Status: DC

## 2018-06-02 MED ORDER — NITROGLYCERIN 0.4 MG SL SUBL: 0.4000 mg | SUBLINGUAL_TABLET | SUBLINGUAL | Status: DC | PRN

## 2018-06-02 MED ORDER — SUCRALFATE 1 G PO TABS
1.0000 g | ORAL_TABLET | Freq: Four times a day (QID) | ORAL | Status: DC
  Administered 2018-06-02 – 2018-06-03 (×4): 1 g via ORAL
  Filled 2018-06-02 (×6): qty 1

## 2018-06-02 MED ORDER — ONDANSETRON HCL 4 MG/2ML IJ SOLN: 4.0000 mg | Freq: Four times a day (QID) | INTRAMUSCULAR | Status: DC | PRN

## 2018-06-02 MED ORDER — ATORVASTATIN CALCIUM 80 MG PO TABS
80.0000 mg | ORAL_TABLET | Freq: Every evening | ORAL | Status: DC
  Administered 2018-06-02 – 2018-06-03 (×2): 80 mg via ORAL
  Filled 2018-06-02 (×2): qty 1

## 2018-06-02 MED ORDER — CALCIUM CARBONATE ANTACID 500 MG PO CHEW: 1.0000 | CHEWABLE_TABLET | Freq: Two times a day (BID) | ORAL | Status: DC | PRN

## 2018-06-02 NOTE — ED Provider Notes (Signed)
[facility] EMERGENCY DEPARTMENT Provider Note   CSN: [id number] Arrival date & time: 06/02/18  0200     History   Chief Complaint Chief Complaint  Patient presents with  . Chest Pain    HPI Sherry Barnett is a 84 y.o. female.  Patient is an 84 year old female with past medical history of coronary artery disease with stent in October 2018, atrial fibrillation, anemia, and recent admission for small bowel obstruction.  She was just discharged yesterday.  This evening she got up to go to the bathroom and developed chest pain.  She reports a pressure in her chest with associated shortness of breath, nausea, and weakness.  She was given nitroglycerin by EMS which helped.  She denies any chest pain at present, but does feel weak.  She tells me she has not had any of her usual medications in the past week due to her admission for intestinal problems.  Patient states this felt similar to her prior cardiac pain, however was worse than in 2018.  The history is provided by the patient.  Chest Pain   This is a new problem. The current episode started 1 to 2 hours ago. The problem occurs constantly. The problem has been rapidly improving. The pain is associated with exertion. The pain is present in the substernal region. The pain is moderate. The quality of the pain is described as pressure-like. The pain does not radiate. Associated symptoms include irregular heartbeat, nausea, shortness of breath and weakness. She has tried nothing for the symptoms.    Past Medical History:  Diagnosis Date  . A-fib ([place])   . Anemia   . Arthritis    "a little in my hands" (08/13/2017)  . Blepharitis    chronic  . CAD (coronary artery disease), native coronary artery    10/18 PCI/DES to p/mLAD, normal EF  . Cardiovascular risk factor    23%  . Degenerative joint disease (DJD) of lumbar spine   . Diverticulosis   . Endometriosis   . Fibroids   . GERD (gastroesophageal reflux disease)   .  Hematuria, microscopic [id number]  . Hemorrhoids   . Hypercholesterolemia    10 years  . Osteopenia 03/2016   start alendrodate  . PONV (postoperative nausea and vomiting)   . Renal disease    stage2/stage 3    Patient Active Problem List   Diagnosis Date Noted  . Leukocytosis 05/29/2018  . GERD (gastroesophageal reflux disease) 05/29/2018  . Partial small bowel obstruction ([place]) 05/28/2018  . Coronary artery disease involving native coronary artery of native heart with angina pectoris ([place])   . Syncope 08/13/2017  . Angina pectoris ([place]) 03/14/2017  . Hyperlipidemia LDL goal <70 03/14/2017  . Paroxysmal atrial fibrillation ([place]) 03/14/2017  . H/O class III angina pectoris 03/14/2017    Past Surgical History:  Procedure Laterality Date  . APPENDECTOMY    . CARDIAC CATHETERIZATION  08/13/2017  . CATARACT EXTRACTION W/ INTRAOCULAR LENS  IMPLANT, BILATERAL Bilateral 2001 -  11/2016   "left-right"  . CORONARY ANGIOPLASTY WITH STENT PLACEMENT    . CORONARY STENT INTERVENTION N/A 03/14/2017   Procedure: CORONARY STENT INTERVENTION;  Surgeon: Belva Crome, MD;  Location: [facility];  Service: Cardiovascular;  Laterality: N/A;  . DILATION AND CURETTAGE OF UTERUS    . LEFT HEART CATH AND CORONARY ANGIOGRAPHY N/A 03/14/2017   Procedure: LEFT HEART CATH AND CORONARY ANGIOGRAPHY;  Surgeon: Belva Crome, MD;  Location: [facility];  Service: Cardiovascular;  Laterality: N/A;  . LEFT HEART CATH AND CORONARY ANGIOGRAPHY N/A 08/13/2017   Procedure: LEFT HEART CATH AND CORONARY ANGIOGRAPHY;  Surgeon: Belva Crome, MD;  Location: [facility];  Service: Cardiovascular;  Laterality: N/A;  . OVARIAN CYST SURGERY Bilateral   . SALPINGOOPHORECTOMY Bilateral    endometriosis; "still have a piece of my left ovary"  . TONSILLECTOMY       OB History   No obstetric history on file.      Home Medications    Prior to Admission medications   Medication Sig Start Date End Date  Taking? Authorizing Provider  alendronate (FOSAMAX) 70 MG tablet Take 70 mg by mouth every Saturday. Take with a full glass of water on an empty stomach.     [provider]  Artificial Tear Ointment (ARTIFICIAL TEARS) ointment Place 1 drop into both eyes 4 (four) times daily as needed (dry eyes). Refresh Tears for dry eyes.    [provider]  aspirin 81 MG EC tablet Take 81 mg by mouth daily with breakfast. Swallow whole.    [provider]  atorvastatin (LIPITOR) 80 MG tablet TAKE 1 TABLET BY MOUTH EVERY DAY AT 6:00 PM Patient taking differently: Take 80 mg by mouth every evening.  06/03/17   Belva Crome, MD  Azelaic Acid (FINACEA) 15 % cream Apply 1 application topically 2 (two) times daily. After skin is thoroughly washed and patted dry, gently but thoroughly massage a thin film of azelaic acid cream into the affected area twice daily, in the morning and evening.     [provider]  calcium carbonate (TUMS EX) 750 MG chewable tablet Chew 1-2 tablets by mouth 2 (two) times daily as needed (for hearburn/indigestion.).     [provider]  cholecalciferol (VITAMIN D) 1000 units tablet Take 1,000 Units by mouth daily.    [provider]  clopidogrel (PLAVIX) 75 MG tablet TAKE 1 TABLET BY MOUTH DAILY 04/08/18   Belva Crome, MD  famotidine (PEPCID) 20 MG tablet Take 1 tablet (20 mg total) by mouth 2 (two) times daily. 05/27/18   Lacretia Leigh, MD  isosorbide mononitrate (IMDUR) 60 MG 24 hr tablet Take 1 tablet (60 mg total) by mouth daily. 08/02/17 07/28/18  Belva Crome, MD  metoprolol succinate (TOPROL-XL) 50 MG 24 hr tablet Take 1 tablet (50 mg total) by mouth daily. Take with or immediately following a meal. 07/23/17 07/18/18  Belva Crome, MD  nitroGLYCERIN (NITROSTAT) 0.4 MG SL tablet Place 1 tablet (0.4 mg total) under the tongue every 5 (five) minutes as needed for chest pain. 02/26/17 08/02/18  Belva Crome, MD  ondansetron  (ZOFRAN-ODT) 8 MG disintegrating tablet Take 8 mg by mouth every 12 (twelve) hours as needed for nausea or vomiting.    [provider]  pantoprazole (PROTONIX) 40 MG tablet Take 40 mg by mouth daily as needed (acid reflux).    [provider]  sucralfate (CARAFATE) 1 g tablet Take 1 tablet (1 g total) by mouth 4 (four) times daily. 05/27/18   Lacretia Leigh, MD  tobramycin-dexamethasone [facility]) ophthalmic ointment Place 1 application into both eyes See admin instructions. Tuesday AND Saturday patient does not use. Will apply at night on all other nights.    [provider]    Family History Family History  Problem Relation Age of Onset  . Heart attack Mother   . Alzheimer's disease Father   . Hypertension Father   .  Stroke Father   . Other Sister 18       MVA    Social History Social History   Tobacco Use  . Smoking status: Former Smoker    Packs/day: 0.10    Years: 5.00    Pack years: 0.50    Types: Cigarettes    Last attempt to quit: 02/06/1969    Years since quitting: 49.3  . Smokeless tobacco: Never Used  Substance Use Topics  . Alcohol use: No  . Drug use: No     Allergies   Sulfa antibiotics; Novocain [procaine]; and Penicillins   Review of Systems Review of Systems  Respiratory: Positive for shortness of breath.   Cardiovascular: Positive for chest pain.  Gastrointestinal: Positive for nausea.  Neurological: Positive for weakness.  All other systems reviewed and are negative.    Physical Exam Updated Vital Signs BP 140/77   Pulse 71   Temp 98.5 F (36.9 C) (Oral)   Resp 13   Ht 5\' 2"  (1.575 m)   Wt 61.7 kg   SpO2 100%   BMI 24.87 kg/m   Physical Exam Vitals signs and nursing note reviewed.  Constitutional:      General: She is not in acute distress.    Appearance: She is well-developed. She is not diaphoretic.  HENT:     Head: Normocephalic and atraumatic.  Neck:     Musculoskeletal: Normal range of motion and  neck supple.  Cardiovascular:     Rate and Rhythm: Normal rate. Rhythm irregular.     Heart sounds: No murmur. No friction rub. No gallop.   Pulmonary:     Effort: Pulmonary effort is normal. No respiratory distress.     Breath sounds: Normal breath sounds. No wheezing.  Abdominal:     General: Bowel sounds are normal. There is no distension.     Palpations: Abdomen is soft.     Tenderness: There is no abdominal tenderness.  Musculoskeletal: Normal range of motion.     Right lower leg: She exhibits no tenderness. No edema.     Left lower leg: She exhibits no tenderness. No edema.  Skin:    General: Skin is warm and dry.  Neurological:     Mental Status: She is alert and oriented to person, place, and time.      ED Treatments / Results  Labs (all labs ordered are listed, but only abnormal results are displayed) Labs Reviewed  COMPREHENSIVE METABOLIC PANEL  CBC WITH DIFFERENTIAL/PLATELET  I-STAT TROPONIN, ED    EKG EKG Interpretation  Date/Time:  Monday June 02 2018 02:06:37 EST Ventricular Rate:  81 PR Interval:    QRS Duration: 96 QT Interval:  393 QTC Calculation: 457 R Axis:   15 Text Interpretation:  Atrial fibrillation Nonspecific T wave abnormality When compared with ecg dated 05/28/2018, afib has replaced sinus rhythm Confirmed by Veryl Speak [phone]) on 06/02/2018 2:11:58 AM   Radiology No results found.  Procedures Procedures (including critical care time)  Medications Ordered in ED Medications - No data to display   Initial Impression / Assessment and Plan / ED Course  I have reviewed the triage vital signs and the nursing notes.  Pertinent labs & imaging results that were available during my care of the patient were reviewed by me and considered in my medical decision making (see chart for details).  Patient presents here with complaints of chest discomfort that started when she walked to the bathroom this evening.  It felt similar to what  she  experienced with her prior heart issues causing her to have a stent placed.  Her pain improved with nitroglycerin by EMS.  Work-up today reveals no changes in her EKG and initial troponin is negative.  Due to the patient's past history and nature of her presentation, I feel as though admission for rule out of MI is appropriate.  I spoken with Dr. Myna Hidalgo who agrees to admit.  Final Clinical Impressions(s) / ED Diagnoses   Final diagnoses:  None    ED Discharge Orders    None       Veryl Speak, MD 06/02/18 [phone]

## 2018-06-02 NOTE — Progress Notes (Signed)
PROGRESS NOTE        PATIENT DETAILS Name: Sherry Barnett Age: 84 y.o. Sex: female Date of Birth: 05/08/1933 Admit Date: 06/02/2018 Admitting Physician Vianne Bulls, MD GUR:[id number], Christiane Ha, MD  Brief Narrative: Patient is a 84 y.o. female with history of CAD status post PCI to [name] October 2018-just discharged from this facility on 12/22 for small bowel obstruction, presented with retrosternal chest pain, when EMS arrived-she was found to have A. fib with RVR.  She was subsequently brought to the ED and admitted to the hospitalist service.  See below for further details  Subjective: No chest pain this morning.  Had a bowel movement earlier this morning.  Assessment/Plan: A. fib with RVR: Back in sinus rhythm.  Continue metoprolol-since on board aspirin/Plavix-we will await cardiology evaluation before starting anticoagulation.Chads2vasc of 4.  Chest pain: With both typical and atypical features-but in the setting of RVR-however has significant underlying CAD.  Troponin is also minimally elevated this morning.  Cardiology consulted for further evaluation/recommendation.  CAD status post PCI to [name] 03/2017: Continue on dual antiplatelet agents (but may need to be started on anticoagulation-and will likely need to be adjusted) beta-blocker and statin.  Recent SBO: Resolved-no abdominal pain-had a bowel movement earlier.  DVT Prophylaxis: Prophylactic Lovenox   Code Status: Full code  Family Communication: Spouse at bedside  Disposition Plan: Remain inpatient  Antimicrobial agents: Anti-infectives (From admission, onward)   None      Procedures: None  CONSULTS:  cardiology  Time spent: 25- minutes-Greater than 50% of this time was spent in counseling, explanation of diagnosis, planning of further management, and coordination of care.  MEDICATIONS: Scheduled Meds: . aspirin EC  81 mg Oral Q breakfast  . atorvastatin  80 mg Oral QPM  .  clopidogrel  75 mg Oral Daily  . enoxaparin (LOVENOX) injection  40 mg Subcutaneous Daily  . famotidine  20 mg Oral BID  . isosorbide mononitrate  60 mg Oral Daily  . metoprolol succinate  50 mg Oral Daily  . pantoprazole  40 mg Oral Daily  . sucralfate  1 g Oral QID   Continuous Infusions: PRN Meds:.acetaminophen, calcium carbonate, nitroGLYCERIN, ondansetron (ZOFRAN) IV   PHYSICAL EXAM: Vital signs: Vitals:   06/02/18 0215 06/02/18 0230 06/02/18 0245 06/02/18 0516  BP: (!) 141/76 121/67 121/69 135/63  Pulse: 83 90 74 65  Resp: 15 16 16 16   Temp:    98.4 F (36.9 C)  TempSrc:    Oral  SpO2: 100% 99% 100% 100%  Weight:    62 kg  Height:    5' 2.5" (1.588 m)   Filed Weights   06/02/18 0209 06/02/18 0516  Weight: 61.7 kg 62 kg   Body mass index is 24.6 kg/m.   General appearance :Awake, alert, not in any distress.  Eyes:Pink conjunctiva HEENT: Atraumatic and Normocephalic Neck: supple Resp:Good air entry bilaterally, no added sounds  CVS: S1 S2 regular, no murmurs.  GI: Bowel sounds present, Non tender and not distended with no gaurding, rigidity or rebound.No organomegaly Extremities: B/L Lower Ext shows no edema, both legs are warm to touch Neurology:  speech clear,Non focal, sensation is grossly intact. Musculoskeletal:No digital cyanosis Skin:No Rash, warm and dry Wounds:N/A  I have personally reviewed following labs and imaging studies  LABORATORY DATA: CBC: Recent Labs  Lab 05/27/18 0258 05/28/18 1600 05/29/18  9470 06/02/18 0323  WBC 11.1* 10.8* 10.7* 9.9  NEUTROABS 8.9*  --   --  6.7  HGB 13.2 13.3 12.7 13.7  HCT 41.2 41.6 40.2 41.4  MCV 96.7 97.4 98.8 95.2  PLT 231 216 200 962    Basic Metabolic Panel: Recent Labs  Lab 05/28/18 1600 05/29/18 0525 05/30/18 0521 05/31/18 0450 06/02/18 0224  NA 136 139 139 135 138  K 3.7 3.3* 4.4 3.2* 3.1*  CL 99 107 104 102 100  CO2 26 23 26 29  21*  GLUCOSE 109* 92 141* 96 109*  BUN 16 16 14 15 9     CREATININE 0.91 0.83 0.87 0.75 0.77  CALCIUM 8.9 8.5* 8.2* 7.8* 8.7*  MG  --  2.3  --   --   --     GFR: Estimated Creatinine Clearance: 46.8 mL/min (by C-G formula based on SCr of 0.77 mg/dL).  Liver Function Tests: Recent Labs  Lab 05/27/18 0258 05/28/18 1600 06/02/18 0224  AST 28 28 29   ALT 32 27 21  ALKPHOS 70 61 58  BILITOT 0.5 1.1 1.1  PROT 6.6 6.3* 6.0*  ALBUMIN 4.1 4.0 3.2*   Recent Labs  Lab 05/27/18 0258 05/28/18 1600  LIPASE 29 27   No results for input(s): AMMONIA in the last 168 hours.  Coagulation Profile: No results for input(s): INR, PROTIME in the last 168 hours.  Cardiac Enzymes: Recent Labs  Lab 06/02/18 0757  TROPONINI 0.13*    BNP (last 3 results) No results for input(s): PROBNP in the last 8760 hours.  HbA1C: No results for input(s): HGBA1C in the last 72 hours.  CBG: No results for input(s): GLUCAP in the last 168 hours.  Lipid Profile: No results for input(s): CHOL, HDL, LDLCALC, TRIG, CHOLHDL, LDLDIRECT in the last 72 hours.  Thyroid Function Tests: No results for input(s): TSH, T4TOTAL, FREET4, T3FREE, THYROIDAB in the last 72 hours.  Anemia Panel: No results for input(s): VITAMINB12, FOLATE, FERRITIN, TIBC, IRON, RETICCTPCT in the last 72 hours.  Urine analysis:    Component Value Date/Time   COLORURINE YELLOW 05/28/2018 [place] 05/28/2018 1755   LABSPEC 1.018 05/28/2018 1755   PHURINE 6.0 05/28/2018 1755   GLUCOSEU NEGATIVE 05/28/2018 1755   HGBUR LARGE (A) 05/28/2018 [place] NEGATIVE 05/28/2018 1755   KETONESUR 80 (A) 05/28/2018 1755   PROTEINUR NEGATIVE 05/28/2018 1755   NITRITE NEGATIVE 05/28/2018 1755   LEUKOCYTESUR TRACE (A) 05/28/2018 1755    Sepsis Labs: Lactic Acid, Venous No results found for: LATICACIDVEN  MICROBIOLOGY: No results found for this or any previous visit (from the past 240 hour(s)).  RADIOLOGY STUDIES/RESULTS: Dg Chest 2 View  Result Date:  06/02/2018 CLINICAL DATA:  Chest pain EXAM: CHEST - 2 VIEW COMPARISON:  05/27/2018 FINDINGS: The heart size and mediastinal contours are within normal limits. Both lungs are clear. The visualized skeletal structures are unremarkable. IMPRESSION: No active cardiopulmonary disease. Electronically Signed   By: Inez Catalina M.D.   On: 06/02/2018 02:55   Ct Abdomen Pelvis W Contrast  Result Date: 05/28/2018 CLINICAL DATA:  Abdominal pain, distention, and nausea and vomiting for several days. Previous appendectomy. EXAM: CT ABDOMEN AND PELVIS WITH CONTRAST TECHNIQUE: Multidetector CT imaging of the abdomen and pelvis was performed using the standard protocol following bolus administration of intravenous contrast. CONTRAST:  177mL ISOVUE-300 IOPAMIDOL (ISOVUE-300) INJECTION 61% COMPARISON:  None. FINDINGS: Lower Chest: No acute findings. Hepatobiliary: No hepatic masses identified. Probable tiny sub-cm cyst in right hepatic  lobe. Gallbladder is unremarkable. No evidence of biliary ductal dilatation. Pancreas:  No mass or inflammatory changes. Spleen: Within normal limits in size and appearance. Adrenals/Urinary Tract: No masses identified. Probable tiny sub-cm left renal cyst noted. No evidence of hydronephrosis. Unremarkable unopacified urinary bladder. Stomach/Bowel: Diverticulosis is seen involving the sigmoid colon, however there is no evidence of diverticulitis. Mild dilatation of multiple mid small bowel loops are seen, with transition point in the anterior mid pelvis (image 28/5). No mass or inflammatory process identified at this transition point, suggesting an adhesion. Vascular/Lymphatic: No pathologically enlarged lymph nodes. No abdominal aortic aneurysm. Aortic atherosclerosis. Reproductive:  No mass or other significant abnormality. Other: Small amount of free fluid seen within the pelvis and bilateral upper quadrants, but no abscess identified. No evidence of free intraperitoneal or other extraluminal  air. Musculoskeletal: No suspicious bone lesions identified. Lumbar spine degenerative changes noted. IMPRESSION: Findings consistent with partial small bowel obstruction, with transition point in the anterior mid pelvis suspicious for adhesion. Small amount of free fluid. No evidence of abscess or free intraperitoneal air. Sigmoid diverticulosis, without radiographic evidence of diverticulitis. Electronically Signed   By: Earle Gell M.D.   On: 05/28/2018 19:14   Dg Abd Acute W/chest  Result Date: 05/27/2018 CLINICAL DATA:  Vomiting and abdominal pain. EXAM: DG ABDOMEN ACUTE W/ 1V CHEST COMPARISON:  None. FINDINGS: The cardiomediastinal contours are normal. The lungs are clear. There is no free intra-abdominal air. No dilated bowel loops to suggest obstruction. Small volume of stool throughout the colon. Calcifications in the left upper quadrant may be vascular but are poorly localized. No acute osseous abnormalities are seen. IMPRESSION: 1. Normal bowel gas pattern. No free air. 2. Rounded calcifications projecting over the left upper quadrant are poorly localized radiographically, may be vascular. 3. No acute pulmonary process. Electronically Signed   By: Keith Rake M.D.   On: 05/27/2018 01:40   Dg Abd Portable 1v  Result Date: 05/30/2018 CLINICAL DATA:  Follow-up small bowel obstruction EXAM: PORTABLE ABDOMEN - 1 VIEW COMPARISON:  05/29/2018 FINDINGS: Interval removal of the nasogastric tube. Mild gaseous distension of small bowel. Air is seen within the colon. Oral contrast material is present within the colon. There is no bowel dilatation to suggest obstruction. There is no evidence of pneumoperitoneum, portal venous gas or pneumatosis. There are no pathologic calcifications along the expected course of the ureters. The osseous structures are unremarkable. IMPRESSION: No bowel dilatation to suggest high-grade obstruction. Mild persistent gaseous distension of small bowel with air seen within  the colon. Oral contrast material is again noted within the colon as well. Electronically Signed   By: Kathreen Devoid   On: 05/30/2018 13:15   Dg Abd Portable 1v  Result Date: 05/29/2018 CLINICAL DATA:  Small-bowel obstruction EXAM: PORTABLE ABDOMEN - 1 VIEW COMPARISON:  None. FINDINGS: There is enteric contrast now within the ascending colon. Nasogastric tube tip and side port project over the gastric body. No dilated small bowel. IMPRESSION: Enteric contrast now within the ascending colon.No dilated bowel visualized. Electronically Signed   By: Ulyses Jarred M.D.   On: 05/29/2018 23:31   Dg Abd Portable 1v-small Bowel Obstruction Protocol-initial, 8 Hr Delay  Result Date: 05/29/2018 CLINICAL DATA:  Small bowel obstruction. EXAM: PORTABLE ABDOMEN - 1 VIEW COMPARISON:  05/28/2018 FINDINGS: NG tube is noted in the stomach. Contrast material from prior CT noted in the urinary bladder. Contrast noted within small bowel loops. Dilated small bowel loops. No organomegaly or free air. Gas and  stool noted within nondistended colon. IMPRESSION: Gas and oral contrast material noted within the small bowel which is dilated compatible with small bowel obstruction. No definite passage of contrast material into the colon. Electronically Signed   By: Rolm Baptise M.D.   On: 05/29/2018 07:30   Dg Abd Portable 1 View  Result Date: 05/28/2018 CLINICAL DATA:  NG tube placement EXAM: PORTABLE ABDOMEN - 1 VIEW COMPARISON:  05/27/2018 radiographs and CT abdomen and pelvis 05/28/2018 FINDINGS: A gastric tube is seen with tip and side port below the left hemidiaphragm in the expected location of the stomach. Opacification of the renal collecting system and included bladder from recent contrast administration. Mild levoconvex curvature of the lumbar spine with degenerative disc and facet arthropathy. Mild diffuse gaseous distention of small bowel loops in the lower abdomen pelvis compatible with findings on recent CT and  suggestive of partial SBO. IMPRESSION: No significant change from 05/27/2018. Electronically Signed   By: Ashley Royalty M.D.   On: 05/28/2018 20:41     LOS: 0 days   Oren Binet, MD  [facility]  If 7PM-7AM, please contact night-coverage  Please page via [email or web address]-click on MD name and type text message  06/02/2018, 1:03 PM

## 2018-06-02 NOTE — Consult Note (Addendum)
Cardiology Consultation:   Patient ID: ASMI FUGERE MRN: [id number]; DOB: April 15, 1933  Admit date: 06/02/2018 Date of Consult: 06/02/2018  Primary Care Provider: Lajean Manes, MD Primary Cardiologist: Sinclair Grooms, MD  Primary Electrophysiologist:  None   Patient Profile:   Sherry Barnett is a 84 y.o. female with a hx of CAD s/p [name] PCI in 2018, remote h/o PAF not previously on anticoagulation, HLD, GERD and recent hospitalization for partial SBO (discharged yesterday), who has been readmitted for CP and dyspnea, in the setting of atrial fibrillation w/ RVR. She is being seen today for the evaluation of atrial fibrillation, CP and elevated troponin, at the request of Dr. Myna Hidalgo, Internal Medicine.  History of Present Illness:   Sherry Barnett is followed by Dr. Tamala Julian. In October 2018, she underwent a LHC given developments of exertional chest and jaw discomfort and was found to have high-grade obstruction in the prox- mid [name], treated with a DES. She underwent repeat cath 08/2017 for recurrent symptoms that showed widely patent [name] stent. There was 50-70% mid [name] stenosis which was evaluated by FFR.  This was not hemodynamically significant (0.87).  Generally speaking, she had small coronary anatomy with diffuse atherosclerosis.  There was no significant change since prior angiogram.  Medical therapy was recommended.  Her isosorbide was increased.  It is also noted that she has a reported prior history of paroxysmal/episodic atrial fibrillation but per notes, there had been no documenting data availableand pt thus had not been on anticoagulation therapy.Additional medical problems include HLD and GERD.   Pt was recently admitted for a partial SBO from 12/18-12/22. SBO improved w/ NGT. There were no documented cardiac issues during that hospitalization. She was discharged home on 12/22. She felt ok when she returned home but had a big BM (pt described it as a "blowout". She later went to bed and  woke up around 1 AM with substernal chest pain and palpitations. She took 1 SL NTG w/o relief. Her husband called EMS and she was found to be in atrial fibrillation with rates in the 150s. She was given metoprolol and converted back to NSR. She was hypokalemic at 3.1 and given supplemental K. Her CP has resolved.    Past Medical History:  Diagnosis Date  . A-fib ([place])   . Anemia   . Arthritis    "a little in my hands" (08/13/2017)  . Blepharitis    chronic  . CAD (coronary artery disease), native coronary artery    10/18 PCI/DES to p/mLAD, normal EF  . Cardiovascular risk factor    23%  . Degenerative joint disease (DJD) of lumbar spine   . Diverticulosis   . Endometriosis   . Fibroids   . GERD (gastroesophageal reflux disease)   . Hematuria, microscopic [id number]  . Hemorrhoids   . Hypercholesterolemia    10 years  . Osteopenia 03/2016   start alendrodate  . PONV (postoperative nausea and vomiting)   . Renal disease    stage2/stage 3    Past Surgical History:  Procedure Laterality Date  . APPENDECTOMY    . CARDIAC CATHETERIZATION  08/13/2017  . CATARACT EXTRACTION W/ INTRAOCULAR LENS  IMPLANT, BILATERAL Bilateral 2001 -  11/2016   "left-right"  . CORONARY ANGIOPLASTY WITH STENT PLACEMENT    . CORONARY STENT INTERVENTION N/A 03/14/2017   Procedure: CORONARY STENT INTERVENTION;  Surgeon: Belva Crome, MD;  Location: [facility];  Service: Cardiovascular;  Laterality: N/A;  . DILATION AND CURETTAGE  OF UTERUS    . LEFT HEART CATH AND CORONARY ANGIOGRAPHY N/A 03/14/2017   Procedure: LEFT HEART CATH AND CORONARY ANGIOGRAPHY;  Surgeon: Belva Crome, MD;  Location: [facility];  Service: Cardiovascular;  Laterality: N/A;  . LEFT HEART CATH AND CORONARY ANGIOGRAPHY N/A 08/13/2017   Procedure: LEFT HEART CATH AND CORONARY ANGIOGRAPHY;  Surgeon: Belva Crome, MD;  Location: [facility];  Service: Cardiovascular;  Laterality: N/A;  . OVARIAN CYST SURGERY Bilateral     . SALPINGOOPHORECTOMY Bilateral    endometriosis; "still have a piece of my left ovary"  . TONSILLECTOMY       Home Medications:  Prior to Admission medications   Medication Sig Start Date End Date Taking? Authorizing Provider  alendronate (FOSAMAX) 70 MG tablet Take 70 mg by mouth every Saturday. Take with a full glass of water on an empty stomach.    Yes [provider]  Artificial Tear Ointment (ARTIFICIAL TEARS) ointment Place 1 drop into both eyes 4 (four) times daily as needed (dry eyes). Refresh Tears for dry eyes.   Yes [provider]  aspirin 81 MG EC tablet Take 81 mg by mouth daily with breakfast. Swallow whole.   Yes [provider]  atorvastatin (LIPITOR) 80 MG tablet TAKE 1 TABLET BY MOUTH EVERY DAY AT 6:00 PM Patient taking differently: Take 80 mg by mouth every evening.  06/03/17  Yes Belva Crome, MD  Azelaic Acid (FINACEA) 15 % cream Apply 1 application topically 2 (two) times daily. After skin is thoroughly washed and patted dry, gently but thoroughly massage a thin film of azelaic acid cream into the affected area twice daily, in the morning and evening.    Yes [provider]  calcium carbonate (TUMS EX) 750 MG chewable tablet Chew 1-2 tablets by mouth 2 (two) times daily as needed (for hearburn/indigestion.).    Yes [provider]  cholecalciferol (VITAMIN D) 1000 units tablet Take 1,000 Units by mouth daily.   Yes [provider]  clopidogrel (PLAVIX) 75 MG tablet TAKE 1 TABLET BY MOUTH DAILY Patient taking differently: Take 75 mg by mouth daily.  04/08/18  Yes Belva Crome, MD  famotidine (PEPCID) 20 MG tablet Take 1 tablet (20 mg total) by mouth 2 (two) times daily. 05/27/18  Yes Lacretia Leigh, MD  isosorbide mononitrate (IMDUR) 60 MG 24 hr tablet Take 1 tablet (60 mg total) by mouth daily. 08/02/17 07/28/18 Yes Belva Crome, MD  metoprolol succinate (TOPROL-XL) 50 MG 24 hr tablet Take 1 tablet (50 mg total)  by mouth daily. Take with or immediately following a meal. 07/23/17 07/18/18 Yes Belva Crome, MD  nitroGLYCERIN (NITROSTAT) 0.4 MG SL tablet Place 1 tablet (0.4 mg total) under the tongue every 5 (five) minutes as needed for chest pain. 02/26/17 08/02/18 Yes Belva Crome, MD  ondansetron (ZOFRAN-ODT) 8 MG disintegrating tablet Take 8 mg by mouth every 12 (twelve) hours as needed for nausea or vomiting.   Yes [provider]  pantoprazole (PROTONIX) 40 MG tablet Take 40 mg by mouth daily as needed (acid reflux).   Yes [provider]  sucralfate (CARAFATE) 1 g tablet Take 1 tablet (1 g total) by mouth 4 (four) times daily. 05/27/18  Yes Lacretia Leigh, MD  tobramycin-dexamethasone [facility]) ophthalmic ointment Place 1 application into both eyes See admin instructions. Tuesday AND Saturday patient does not use. Will apply at night on all other nights.   Yes [provider]    Inpatient Medications: Scheduled Meds: . aspirin EC  81 mg Oral Q breakfast  . atorvastatin  80 mg Oral QPM  . clopidogrel  75 mg Oral Daily  . enoxaparin (LOVENOX) injection  40 mg Subcutaneous Daily  . famotidine  20 mg Oral BID  . isosorbide mononitrate  60 mg Oral Daily  . metoprolol succinate  50 mg Oral Daily  . pantoprazole  40 mg Oral Daily  . sucralfate  1 g Oral QID   Continuous Infusions:  PRN Meds: acetaminophen, calcium carbonate, nitroGLYCERIN, ondansetron (ZOFRAN) IV  Allergies:    Allergies  Allergen Reactions  . Sulfa Antibiotics Other (See Comments)    HEMATURIA  . Novocain [Procaine] Palpitations  . Penicillins Rash    Has patient had a PCN reaction causing immediate rash, facial/tongue/throat swelling, SOB or lightheadedness with hypotension: Yes Has patient had a PCN reaction causing severe rash involving mucus membranes or skin necrosis: Unknown Has patient had a PCN reaction that required hospitalization: No Has patient had a PCN reaction occurring within the  last 10 years: No If all of the above answers are "NO", then may proceed with Cephalosporin use.     Social History:   Social History   Socioeconomic History  . Marital status: Married    Spouse name: Not on file  . Number of children: Not on file  . Years of education: Not on file  . Highest education level: Not on file  Occupational History  . Not on file  Social Needs  . Financial resource strain: Not on file  . Food insecurity:    Worry: Not on file    Inability: Not on file  . Transportation needs:    Medical: Not on file    Non-medical: Not on file  Tobacco Use  . Smoking status: Former Smoker    Packs/day: 0.10    Years: 5.00    Pack years: 0.50    Types: Cigarettes    Last attempt to quit: 02/06/1969    Years since quitting: 49.3  . Smokeless tobacco: Never Used  Substance and Sexual Activity  . Alcohol use: No  . Drug use: No  . Sexual activity: Not on file    Comment: MARRIED  Lifestyle  . Physical activity:    Days per week: Not on file    Minutes per session: Not on file  . Stress: Not on file  Relationships  . Social connections:    Talks on phone: Not on file    Gets together: Not on file    Attends religious service: Not on file    Active member of club or organization: Not on file    Attends meetings of clubs or organizations: Not on file    Relationship status: Not on file  . Intimate partner violence:    Fear of current or ex partner: Not on file    Emotionally abused: Not on file    Physically abused: Not on file    Forced sexual activity: Not on file  Other Topics Concern  . Not on file  Social History Narrative  . Not on file    Family History:    Family History  Problem Relation Age of Onset  . Heart attack Mother   . Alzheimer's disease Father   . Hypertension Father   . Stroke Father   . Other Sister 29       MVA     ROS:  Please see the history  of present illness.   All other ROS reviewed and negative.     Physical  Exam/Data:   Vitals:   06/02/18 0215 06/02/18 0230 06/02/18 0245 06/02/18 0516  BP: (!) 141/76 121/67 121/69 135/63  Pulse: 83 90 74 65  Resp: 15 16 16 16   Temp:    98.4 F (36.9 C)  TempSrc:    Oral  SpO2: 100% 99% 100% 100%  Weight:    62 kg  Height:    5' 2.5" (1.588 m)    Intake/Output Summary (Last 24 hours) at 06/02/2018 1129 Last data filed at 06/02/2018 0914 Gross per 24 hour  Intake 100 ml  Output 100 ml  Net 0 ml   Filed Weights   06/02/18 0209 06/02/18 0516  Weight: 61.7 kg 62 kg   Body mass index is 24.6 kg/m.  General:  This elderly WF, in no acute distress HEENT:  Right eye subconjunctival hemorrhage Lymph: no adenopathy Neck: no JVD Endocrine:  No thryomegaly Vascular: No carotid bruits; FA pulses 2+ bilaterally without bruits  Cardiac:  normal S1, S2; RRR; no murmur  Lungs:  clear to auscultation bilaterally, no wheezing, rhonchi or rales  Abd: soft, nontender, no hepatomegaly  Ext: no edema Musculoskeletal:  No deformities, BUE and BLE strength normal and equal Skin: warm and dry  Neuro:  CNs 2-12 intact, no focal abnormalities noted Psych:  Normal affect   EKG:  The EKG was personally reviewed and demonstrates:  intitial EKG 12/23  @ 0206 showed afib 81 bpm. Repeat EKGs show NSR. Telemetry:  Telemetry was personally reviewed and demonstrates:  Currently NSR  Relevant CV Studies: [facility] 08/2017  Conclusion     Ost 3rd Mrg lesion is 85% stenosed.    Left main without evidence of significant obstruction  [name] contains mid and distal vessel significant tortuosity before wrapping around the left ventricular apex.  The previously placed proximal to mid [name] stent is widely patent.  The first dominant diagonal contains eccentric ostial to proximal 60-70% narrowing.  The [name] beyond the diagonal is calcified and contains eccentric 50-70% narrowing.  Circumflex contains proximal and mid luminal irregularities.  Distally before the origin of the dominant  obtuse marginal branch there is 50% narrowing.  The continuation of the circumflex beyond the second obtuse marginal contains ostial 90% narrowing.  This is unchanged from the prior angiogram.  The right coronary contains 30-40% ostial narrowing.  No catheter dampening is noted.  The vessel contains tortuosity.  The mid vessel after the acute marginal branch contains 50-70% narrowing.  This is unchanged from prior.  The PDA contains diffuse narrowing from proximal to mid segment 50-70%.  FFR of the mid [name] lesion was 0.87.  No interventional therapy was performed on this occasion.  Generally speaking, the coronary is a small in caliber representing diffuse atherosclerosis.  RECOMMENDATIONS:   Medical therapy.  Perhaps further up titration of isosorbide therapy.  Risk factor modification.     Coronary Diagrams   Diagnostic  Dominance: Co-dominant     Laboratory Data:  Chemistry Recent Labs  Lab 05/30/18 0521 05/31/18 0450 06/02/18 0224  NA 139 135 138  K 4.4 3.2* 3.1*  CL 104 102 100  CO2 26 29 21*  GLUCOSE 141* 96 109*  BUN 14 15 9   CREATININE 0.87 0.75 0.77  CALCIUM 8.2* 7.8* 8.7*  GFRNONAA >60 >60 >60  GFRAA >60 >60 >60  ANIONGAP 9 4* 17*    Recent Labs  Lab 05/27/18 0258 05/28/18 1600  06/02/18 0224  PROT 6.6 6.3* 6.0*  ALBUMIN 4.1 4.0 3.2*  AST 28 28 29   ALT 32 27 21  ALKPHOS 70 61 58  BILITOT 0.5 1.1 1.1   Hematology Recent Labs  Lab 05/28/18 1600 05/29/18 0525 06/02/18 0323  WBC 10.8* 10.7* 9.9  RBC 4.27 4.07 4.35  HGB 13.3 12.7 13.7  HCT 41.6 40.2 41.4  MCV 97.4 98.8 95.2  MCH 31.1 31.2 31.5  MCHC 32.0 31.6 33.1  RDW 13.6 13.7 13.1  PLT 216 200 195   Cardiac Enzymes Recent Labs  Lab 06/02/18 0757  TROPONINI 0.13*    Recent Labs  Lab 05/28/18 1609 06/02/18 0223  TROPIPOC 0.02 0.06    BNPNo results for input(s): BNP, PROBNP in the last 168 hours.  DDimer No results for input(s): DDIMER in the last 168  hours.  Radiology/Studies:  Dg Chest 2 View  Result Date: 06/02/2018 CLINICAL DATA:  Chest pain EXAM: CHEST - 2 VIEW COMPARISON:  05/27/2018 FINDINGS: The heart size and mediastinal contours are within normal limits. Both lungs are clear. The visualized skeletal structures are unremarkable. IMPRESSION: No active cardiopulmonary disease. Electronically Signed   By: Inez Catalina M.D.   On: 06/02/2018 02:55   Dg Abd Portable 1v  Result Date: 05/30/2018 CLINICAL DATA:  Follow-up small bowel obstruction EXAM: PORTABLE ABDOMEN - 1 VIEW COMPARISON:  05/29/2018 FINDINGS: Interval removal of the nasogastric tube. Mild gaseous distension of small bowel. Air is seen within the colon. Oral contrast material is present within the colon. There is no bowel dilatation to suggest obstruction. There is no evidence of pneumoperitoneum, portal venous gas or pneumatosis. There are no pathologic calcifications along the expected course of the ureters. The osseous structures are unremarkable. IMPRESSION: No bowel dilatation to suggest high-grade obstruction. Mild persistent gaseous distension of small bowel with air seen within the colon. Oral contrast material is again noted within the colon as well. Electronically Signed   By: Kathreen Devoid   On: 05/30/2018 13:15   Dg Abd Portable 1v  Result Date: 05/29/2018 CLINICAL DATA:  Small-bowel obstruction EXAM: PORTABLE ABDOMEN - 1 VIEW COMPARISON:  None. FINDINGS: There is enteric contrast now within the ascending colon. Nasogastric tube tip and side port project over the gastric body. No dilated small bowel. IMPRESSION: Enteric contrast now within the ascending colon.No dilated bowel visualized. Electronically Signed   By: Ulyses Jarred M.D.   On: 05/29/2018 23:31    Assessment and Plan:   1. Atrial Fibrillation: initially in afib w/ RVR in the ED. She has converted to NSR after dose of metoprolol. K low at 3.1. She has recieved supplementation. Also check Mg and TSH.  Continue to cycle troponin's x 3.  Obtain 2D echo to assess LVEF and check for WMAs. Continue metoprolol and continue to monitor on tele. CHA2DS2 VASc score is at least 4 for Age >36, female sex and vascular dz. This patients CHA2DS2-VASc Score and unadjusted Ischemic Stroke Rate (% per year) is equal to 4.8 % stroke rate/year from a score of 4. Recommend oral anticoagulation, but would hold initiation of oral agent until we are sure she will not need a LHC.  2. Chest Pain: in the setting of rapid afib and underlying CAD. CP has resolved now that she is back in NSR.   3. Elevated Troponin: initial trop mildly elevated at 0.13. This may be 2/2 to demand ischemia from rapid afib. However she does have known CAD with prior [name] stenting and known moderate  [name] stenosis that has been treated medically.  We will continue to cycle troponins x 3 to assess trend. If trend is flat, then suspect likely demand related and we can plan for stress test tomorrow. However if significant rise and fall in level, then she will need repeat cath. If troponin increases further, then we may need to change from Lovenox to heparin.   4. CAD: s/p prox to mid [name] DES 03/2017. Repeat cath in 08/2017 showed widely patent [name] stent with 50-70% mid [name] stenosis which was evaluated by FFR.  This was not hemodynamically significant (0.87) and medical therapy was recommended. She now presents with CP, dyspnea, atrial fibrillation and midly elevated troponin. she is back in NSR and CP resolved.  We will continue to cycle enzymes to assess trend and will get a 2D echo. For now, continue medical therapy. Plan possible NST or LHC tomorrow.   5. Recent Partial SBO: pt treated 12/18-12/22. Responded to NTG. Had a BM last night. Tolerating PO ok.   6. Hypokalemia: 3.1 on admit, after recent large BM last night, described by pt as a "blowout". Supplementation has been given. Repeat BMP in the AM. Check Mg.   6. Right eye subconjunctival hemorrhage:  present for several days. Pt attributes from recent forceful vomiting she had w/ SBO. She reports h/o this before. No treatment needed. Should resolve spontaneously.     Signed, Lyda Jester, PA-C  06/02/2018 11:29 AM    I have seen, examined and evaluated the patient this PM along with Sherry Henri, PA-C.  After reviewing all the available data and chart, we discussed the patients laboratory, study & physical findings as well as symptoms in detail. I agree with her findings, examination as well as impression recommendations as per our discussion.    Interesting scenario.  Sherry Barnett was recently discharged with bowel obstruction and then went home, shortly after having a large BM, she woke up with chest pain, high blood pressure and high heart rate. Pain did not get better with nitroglycerin, but finally improved after receiving IV Lopressor by ER.  Had been in A. fib RVR spontaneously converted after IV metoprolol.  The question now with mild troponin elevation is which happen first.  Was this ACS related chest pain/non-STEMI with A. fib as a byproduct, or is it A. fib RVR causing chest pain with demand ischemia. Need to follow troponin levels in order to determine. Currently she is not on anticoagulation because of her right eye conjunctival hemorrhage --> if troponin continues to go up, would treat as non-STEMI, however if it stays borderline elevated and flat, would probably be more related to demand ischemia.  Recommendation: Follow troponin levels make adjustments accordingly.  If troponin stays flat, would hold off on anticoagulation until able to do a stress test tomorrow.  If troponin continues to increase, would likely need to consider cardiac catheterization to reinvestigate non-CAD.  Continue aspirin and Plavix for now, but will likely need to place on DOAC prior to discharge.  Would then stop aspirin and continue Plavix.  Remains on stable dose of Toprol, however may  need to have as needed medication for recurrence of A. fib.  I think this current episode may very well be related to her prolonged hospitalization, and therefore she may not need breakthrough medications, however perhaps taking an additional dose of metoprolol 25 mg would help.   We will follow tomorrow.    Sherry Barnett, M.D., M.S. Interventional Cardiologist   Pager # [phone]  Phone # [phone] [address] [place], [place] [address]   For questions or updates, please contact [place] Please consult [email or web address] for contact info under

## 2018-06-02 NOTE — ED Triage Notes (Signed)
Per EMS, patient started having CP at 0115 this am, describing as pressure radiating to neck. Took 2 nitro at home PTA. Cardiac history of 95% blockage with stent placement last year. EMS stated she also was in afib pta with rate 150-170, ems administered 5mg  metoprolol and HR now 80-90bpm. CP now 3/10. Patient aox4, VSS

## 2018-06-02 NOTE — Progress Notes (Signed)
CRITICAL VALUE ALERT  Critical Value: 0.13 troponin  Date & Time Notied:  06/02/2018 9:08 Provider Notified:Hospitalist  Orders Received/Actions taken:will  Wait for phy response. Pt has no c/o

## 2018-06-02 NOTE — H&P (Signed)
History and Physical    [name] AVW:[id number] DOB: Sherry Barnett DOA: 06/02/2018  PCP: Lajean Manes, MD   Patient coming from: Home   Chief Complaint: Chest pain   HPI: Sherry Barnett is a 84 y.o. female with medical history significant for coronary artery disease with stent to [name] 1 year ago, paroxysmal atrial fibrillation not on anticoagulation, and recent SBO that resolved with conservative management and for which she was just discharged yesterday, now presenting to the emergency department with acute onset of chest pain.  Patient reports that she has been tolerating meals at home, continues to pass flatus and have bowel movements, and was feeling well when she went to bed last night.  She woke early this morning to use the restroom and was experiencing severe central chest discomfort with radiation up into her neck.  She took 2 nitroglycerin without any appreciable relief and then called EMS.  EMS found the patient to be in atrial fibrillation with rate 150-170, administered 324 mg of aspirin and 5 mg of IV Lopressor with improvement in heart rate.  She was then brought into the ED for evaluation.  ED Course: Upon arrival to the ED, patient is found to be afebrile, saturating well on room air, and with vitals otherwise normal.  EKG features atrial fibrillation and chest x-ray is negative for acute cardiopulmonary disease.  Chemistry panel is notable for potassium of 3.1.  CBC is unremarkable and troponin is within the normal limits.  Patient's chest pain had improved prior to arrival in the ED, and then eased off and has resolved.  She remains hemodynamically stable and will be observed for further evaluation and management of chest pain with known CAD, though pain possibly secondary to rapid A. fib.  Review of Systems:  All other systems reviewed and apart from HPI, are negative.  Past Medical History:  Diagnosis Date  . A-fib ([place])   . Anemia   . Arthritis    "a little in my hands"  (08/13/2017)  . Blepharitis    chronic  . CAD (coronary artery disease), native coronary artery    10/18 PCI/DES to p/mLAD, normal EF  . Cardiovascular risk factor    23%  . Degenerative joint disease (DJD) of lumbar spine   . Diverticulosis   . Endometriosis   . Fibroids   . GERD (gastroesophageal reflux disease)   . Hematuria, microscopic [id number]  . Hemorrhoids   . Hypercholesterolemia    10 years  . Osteopenia 03/2016   start alendrodate  . PONV (postoperative nausea and vomiting)   . Renal disease    stage2/stage 3    Past Surgical History:  Procedure Laterality Date  . APPENDECTOMY    . CARDIAC CATHETERIZATION  08/13/2017  . CATARACT EXTRACTION W/ INTRAOCULAR LENS  IMPLANT, BILATERAL Bilateral 2001 -  11/2016   "left-right"  . CORONARY ANGIOPLASTY WITH STENT PLACEMENT    . CORONARY STENT INTERVENTION N/A 03/14/2017   Procedure: CORONARY STENT INTERVENTION;  Surgeon: Belva Crome, MD;  Location: [facility];  Service: Cardiovascular;  Laterality: N/A;  . DILATION AND CURETTAGE OF UTERUS    . LEFT HEART CATH AND CORONARY ANGIOGRAPHY N/A 03/14/2017   Procedure: LEFT HEART CATH AND CORONARY ANGIOGRAPHY;  Surgeon: Belva Crome, MD;  Location: [facility];  Service: Cardiovascular;  Laterality: N/A;  . LEFT HEART CATH AND CORONARY ANGIOGRAPHY N/A 08/13/2017   Procedure: LEFT HEART CATH AND CORONARY ANGIOGRAPHY;  Surgeon: Belva Crome, MD;  Location: [facility];  Service: Cardiovascular;  Laterality: N/A;  . OVARIAN CYST SURGERY Bilateral   . SALPINGOOPHORECTOMY Bilateral    endometriosis; "still have a piece of my left ovary"  . TONSILLECTOMY       reports that she quit smoking about 49 years ago. Her smoking use included cigarettes. She has a 0.50 pack-year smoking history. She has never used smokeless tobacco. She reports that she does not drink alcohol or use drugs.  Allergies  Allergen Reactions  . Sulfa Antibiotics Other (See Comments)     HEMATURIA  . Novocain [Procaine] Palpitations  . Penicillins Rash    Has patient had a PCN reaction causing immediate rash, facial/tongue/throat swelling, SOB or lightheadedness with hypotension: Yes Has patient had a PCN reaction causing severe rash involving mucus membranes or skin necrosis: Unknown Has patient had a PCN reaction that required hospitalization: No Has patient had a PCN reaction occurring within the last 10 years: No If all of the above answers are "NO", then Sherry proceed with Cephalosporin use.     Family History  Problem Relation Age of Onset  . Heart attack Mother   . Alzheimer's disease Father   . Hypertension Father   . Stroke Father   . Other Sister 61       MVA     Prior to Admission medications   Medication Sig Start Date End Date Taking? Authorizing Provider  alendronate (FOSAMAX) 70 MG tablet Take 70 mg by mouth every Saturday. Take with a full glass of water on an empty stomach.     [provider]  Artificial Tear Ointment (ARTIFICIAL TEARS) ointment Place 1 drop into both eyes 4 (four) times daily as needed (dry eyes). Refresh Tears for dry eyes.    [provider]  aspirin 81 MG EC tablet Take 81 mg by mouth daily with breakfast. Swallow whole.    [provider]  atorvastatin (LIPITOR) 80 MG tablet TAKE 1 TABLET BY MOUTH EVERY DAY AT 6:00 PM Patient taking differently: Take 80 mg by mouth every evening.  06/03/17   Belva Crome, MD  Azelaic Acid (FINACEA) 15 % cream Apply 1 application topically 2 (two) times daily. After skin is thoroughly washed and patted dry, gently but thoroughly massage a thin film of azelaic acid cream into the affected area twice daily, in the morning and evening.     [provider]  calcium carbonate (TUMS EX) 750 MG chewable tablet Chew 1-2 tablets by mouth 2 (two) times daily as needed (for hearburn/indigestion.).     [provider]  cholecalciferol (VITAMIN D) 1000 units tablet  Take 1,000 Units by mouth daily.    [provider]  clopidogrel (PLAVIX) 75 MG tablet TAKE 1 TABLET BY MOUTH DAILY 04/08/18   Belva Crome, MD  famotidine (PEPCID) 20 MG tablet Take 1 tablet (20 mg total) by mouth 2 (two) times daily. 05/27/18   Lacretia Leigh, MD  isosorbide mononitrate (IMDUR) 60 MG 24 hr tablet Take 1 tablet (60 mg total) by mouth daily. 08/02/17 07/28/18  Belva Crome, MD  metoprolol succinate (TOPROL-XL) 50 MG 24 hr tablet Take 1 tablet (50 mg total) by mouth daily. Take with or immediately following a meal. 07/23/17 07/18/18  Belva Crome, MD  nitroGLYCERIN (NITROSTAT) 0.4 MG SL tablet Place 1 tablet (0.4 mg total) under the tongue every 5 (five) minutes as needed for chest pain. 02/26/17 08/02/18  Belva Crome, MD  ondansetron (ZOFRAN-ODT)  8 MG disintegrating tablet Take 8 mg by mouth every 12 (twelve) hours as needed for nausea or vomiting.    [provider]  pantoprazole (PROTONIX) 40 MG tablet Take 40 mg by mouth daily as needed (acid reflux).    [provider]  sucralfate (CARAFATE) 1 g tablet Take 1 tablet (1 g total) by mouth 4 (four) times daily. 05/27/18   Lacretia Leigh, MD  tobramycin-dexamethasone [facility]) ophthalmic ointment Place 1 application into both eyes See admin instructions. Tuesday AND Saturday patient does not use. Will apply at night on all other nights.    [provider]    Physical Exam: Vitals:   06/02/18 0209 06/02/18 0215 06/02/18 0230 06/02/18 0245  BP:  (!) 141/76 121/67 121/69  Pulse:  83 90 74  Resp:  15 16 16   Temp: 98.5 F (36.9 C)     TempSrc: Oral     SpO2:  100% 99% 100%  Weight: 61.7 kg     Height: 5\' 2"  (1.575 m)       Constitutional: NAD, calm  Eyes: PERTLA, right subconjunctival hemorrhage ENMT: Mucous membranes are moist. Posterior pharynx clear of any exudate or lesions.   Neck: normal, supple, no masses, no thyromegaly Respiratory: clear to auscultation bilaterally, no  wheezing, no crackles. Normal respiratory effort.    Cardiovascular: Rate ~60 and irregular. No extremity edema.   Abdomen: No distension, no tenderness, soft. Bowel sounds active.   Musculoskeletal: no clubbing / cyanosis. No joint deformity upper and lower extremities.   Skin: no significant rashes, lesions, ulcers. Warm, dry, well-perfused. Neurologic: CN 2-12 grossly intact. Sensation intact. Strength 5/5 in all 4 limbs.  Psychiatric: Alert and oriented x 3. Very pleasant, cooperative.    Labs on Admission: I have personally reviewed following labs and imaging studies  CBC: Recent Labs  Lab 05/27/18 0258 05/28/18 1600 05/29/18 0525 06/02/18 0323  WBC 11.1* 10.8* 10.7* 9.9  NEUTROABS 8.9*  --   --  6.7  HGB 13.2 13.3 12.7 13.7  HCT 41.2 41.6 40.2 41.4  MCV 96.7 97.4 98.8 95.2  PLT 231 216 200 793   Basic Metabolic Panel: Recent Labs  Lab 05/28/18 1600 05/29/18 0525 05/30/18 0521 05/31/18 0450 06/02/18 0224  NA 136 139 139 135 138  K 3.7 3.3* 4.4 3.2* 3.1*  CL 99 107 104 102 100  CO2 26 23 26 29  21*  GLUCOSE 109* 92 141* 96 109*  BUN 16 16 14 15 9   CREATININE 0.91 0.83 0.87 0.75 0.77  CALCIUM 8.9 8.5* 8.2* 7.8* 8.7*  MG  --  2.3  --   --   --    GFR: Estimated Creatinine Clearance: 46 mL/min (by C-G formula based on SCr of 0.77 mg/dL). Liver Function Tests: Recent Labs  Lab 05/27/18 0258 05/28/18 1600 06/02/18 0224  AST 28 28 29   ALT 32 27 21  ALKPHOS 70 61 58  BILITOT 0.5 1.1 1.1  PROT 6.6 6.3* 6.0*  ALBUMIN 4.1 4.0 3.2*   Recent Labs  Lab 05/27/18 0258 05/28/18 1600  LIPASE 29 27   No results for input(s): AMMONIA in the last 168 hours. Coagulation Profile: No results for input(s): INR, PROTIME in the last 168 hours. Cardiac Enzymes: No results for input(s): CKTOTAL, CKMB, CKMBINDEX, TROPONINI in the last 168 hours. BNP (last 3 results) No results for input(s): PROBNP in the last 8760 hours. HbA1C: No results for input(s): HGBA1C in the last  72 hours. CBG: No results for input(s): GLUCAP  in the last 168 hours. Lipid Profile: No results for input(s): CHOL, HDL, LDLCALC, TRIG, CHOLHDL, LDLDIRECT in the last 72 hours. Thyroid Function Tests: No results for input(s): TSH, T4TOTAL, FREET4, T3FREE, THYROIDAB in the last 72 hours. Anemia Panel: No results for input(s): VITAMINB12, FOLATE, FERRITIN, TIBC, IRON, RETICCTPCT in the last 72 hours. Urine analysis:    Component Value Date/Time   COLORURINE YELLOW 05/28/2018 [place] 05/28/2018 1755   LABSPEC 1.018 05/28/2018 1755   PHURINE 6.0 05/28/2018 1755   GLUCOSEU NEGATIVE 05/28/2018 1755   HGBUR LARGE (A) 05/28/2018 [place] NEGATIVE 05/28/2018 1755   KETONESUR 80 (A) 05/28/2018 1755   PROTEINUR NEGATIVE 05/28/2018 1755   NITRITE NEGATIVE 05/28/2018 1755   LEUKOCYTESUR TRACE (A) 05/28/2018 1755   Sepsis Labs: @LABRCNTIP (procalcitonin:4,lacticidven:4) )No results found for this or any previous visit (from the past 240 hour(s)).   Radiological Exams on Admission: Dg Chest 2 View  Result Date: 06/02/2018 CLINICAL DATA:  Chest pain EXAM: CHEST - 2 VIEW COMPARISON:  05/27/2018 FINDINGS: The heart size and mediastinal contours are within normal limits. Both lungs are clear. The visualized skeletal structures are unremarkable. IMPRESSION: No active cardiopulmonary disease. Electronically Signed   By: Inez Catalina M.D.   On: 06/02/2018 02:55    EKG: Independently reviewed. Atrial fibrillation.   Assessment/Plan   1. Chest pain; CAD  - Presents with acute-onset chest pain that developed when she woke to use restroom  - She has known CAD with stent to [name] in 2018 and other non-obstructive lesions noted on cath in March 2019 managed medically  - On EMS arrival, she was in atrial fibrillation with rate 150-170  - She received NTG x2, ASA 324 mg, and 5 mg IV Lopressor prior to arrival with improvement in pain which has since resolved while in ED  - She  has known CAD, but suspect this episode of chest discomfort was d/t rapid afib  - Plan to continue cardiac monitoring, obtain serial troponin measurements, repeat EKG, continue ASA, Plavix, statin, nitrates, and beta-blocker    2. Paroxysmal atrial fibrillation with RVR  - Has reported hx of PAF remotely, but not documented on EKG's in Epic and has not been on anticoagulation  - EMS reports AF with rates 150-170's that was treated with Lopressor 5 mg IV prior to arrival, rate 70-90 in ED  - CHADS-VASc at least 28 (age x2, gender, CAD)  - She denies hx of bleeding and Sherry benefit from anticoagulation for reduction of stroke risk, but with fairly severe subconjunctival hemorrhage she understandably wants to delay this and discuss with her cardiologist first  - Replace potassium, continue cardiac monitoring for now, continue ASA, Plavix, and metoprolol   3. Hypokalemia  - Serum potassium is 3.1 on admission  - Replaced with 40 mEq oral potassium   4. Subconjunctival hemorrhage, right  - Non-traumatic, occurred in setting of SBO with recurrent vomiting  - No vision change or pain, continue to monitor     DVT prophylaxis: Lovenox Code Status: Full  Family Communication: Husband and daughter updated at bedside Consults called: none Admission status: observation     Vianne Bulls, MD [facility] Pager [phone]  If 7PM-7AM, please contact night-coverage [email or web address] Password [email or web address]  06/02/2018, 4:14 AM

## 2018-06-03 ENCOUNTER — Observation Stay (HOSPITAL_BASED_OUTPATIENT_CLINIC_OR_DEPARTMENT_OTHER): Payer: Medicare Other

## 2018-06-03 DIAGNOSIS — R079 Chest pain, unspecified: Secondary | ICD-10-CM | POA: Diagnosis not present

## 2018-06-03 DIAGNOSIS — I25119 Atherosclerotic heart disease of native coronary artery with unspecified angina pectoris: Secondary | ICD-10-CM | POA: Diagnosis not present

## 2018-06-03 DIAGNOSIS — R9439 Abnormal result of other cardiovascular function study: Secondary | ICD-10-CM

## 2018-06-03 DIAGNOSIS — I480 Paroxysmal atrial fibrillation: Secondary | ICD-10-CM | POA: Diagnosis not present

## 2018-06-03 DIAGNOSIS — I4891 Unspecified atrial fibrillation: Secondary | ICD-10-CM

## 2018-06-03 DIAGNOSIS — H1131 Conjunctival hemorrhage, right eye: Secondary | ICD-10-CM | POA: Diagnosis not present

## 2018-06-03 LAB — BASIC METABOLIC PANEL
Anion gap: 10 (ref 5–15)
BUN: 8 mg/dL (ref 8–23)
CO2: 21 mmol/L — ABNORMAL LOW (ref 22–32)
Calcium: 8.1 mg/dL — ABNORMAL LOW (ref 8.9–10.3)
Chloride: 105 mmol/L (ref 98–111)
Creatinine, Ser: 0.88 mg/dL (ref 0.44–1.00)
GFR calc Af Amer: >60 mL/min (ref >60)
GFR calc non Af Amer: >60 mL/min (ref >60)
Glucose, Bld: 95 mg/dL (ref 70–99)
Potassium: 3.8 mmol/L (ref 3.5–5.1)
Sodium: 136 mmol/L (ref 135–145)

## 2018-06-03 LAB — NM MYOCAR MULTI W/SPECT W/WALL MOTION / EF
CSEPPHR: 91 {beats}/min
Estimated workload: 1 METS
Exercise duration (min): 0 min
Exercise duration (sec): 0 s
Rest HR: 68 {beats}/min

## 2018-06-03 LAB — CBC
HCT: 36.2 % (ref 36.0–46.0)
Hemoglobin: 12.1 g/dL (ref 12.0–15.0)
MCH: 30.9 pg (ref 26.0–34.0)
MCHC: 33.4 g/dL (ref 30.0–36.0)
MCV: 92.6 fL (ref 80.0–100.0)
Platelets: 191 10*3/uL (ref 150–400)
RBC: 3.91 MIL/uL (ref 3.87–5.11)
RDW: 13.3 % (ref 11.5–15.5)
WBC: 7.7 10*3/uL (ref 4.0–10.5)
nRBC: 0 % (ref 0.0–0.2)

## 2018-06-03 MED ORDER — REGADENOSON 0.4 MG/5ML IV SOLN
INTRAVENOUS | Status: AC
  Administered 2018-06-03: 0.4 mg via INTRAVENOUS
  Filled 2018-06-03: qty 5

## 2018-06-03 MED ORDER — APIXABAN 5 MG PO TABS: 5.0000 mg | ORAL_TABLET | Freq: Two times a day (BID) | ORAL | 1 refills | Status: DC

## 2018-06-03 MED ORDER — NITROGLYCERIN 0.4 MG SL SUBL
0.4000 mg | SUBLINGUAL_TABLET | Freq: Once | SUBLINGUAL | Status: AC
  Administered 2018-06-03: 0.4 mg via SUBLINGUAL

## 2018-06-03 MED ORDER — TECHNETIUM TC 99M TETROFOSMIN IV KIT
10.0000 | PACK | Freq: Once | INTRAVENOUS | Status: AC | PRN
  Administered 2018-06-03: 10 via INTRAVENOUS

## 2018-06-03 MED ORDER — REGADENOSON 0.4 MG/5ML IV SOLN
0.4000 mg | Freq: Once | INTRAVENOUS | Status: AC
  Administered 2018-06-03: 0.4 mg via INTRAVENOUS
  Filled 2018-06-03: qty 5

## 2018-06-03 MED ORDER — NITROGLYCERIN 0.4 MG SL SUBL
SUBLINGUAL_TABLET | SUBLINGUAL | Status: AC
  Filled 2018-06-03: qty 1

## 2018-06-03 MED ORDER — TECHNETIUM TC 99M TETROFOSMIN IV KIT
30.0000 | PACK | Freq: Once | INTRAVENOUS | Status: AC | PRN
  Administered 2018-06-03: 30 via INTRAVENOUS

## 2018-06-03 MED ORDER — PANTOPRAZOLE SODIUM 40 MG PO TBEC: 40.0000 mg | DELAYED_RELEASE_TABLET | Freq: Every day | ORAL | 0 refills | Status: DC | PRN

## 2018-06-03 NOTE — Plan of Care (Signed)
  Problem: Education: Goal: Knowledge of disease or condition will improve Outcome: Progressing Goal: Understanding of medication regimen will improve Outcome: Progressing   Problem: Education: Goal: Knowledge of disease or condition will improve Outcome: Progressing Goal: Understanding of medication regimen will improve Outcome: Progressing   Problem: Cardiac: Goal: Ability to achieve and maintain adequate cardiopulmonary perfusion will improve Outcome: Adequate for Discharge   Problem: Cardiac: Goal: Ability to achieve and maintain adequate cardiopulmonary perfusion will improve Outcome: Adequate for Discharge

## 2018-06-03 NOTE — Care Management Obs Status (Signed)
[place] NOTIFICATION   Patient Details  Name: Sherry Barnett MRN: [id number] Date of Birth: 1933-05-17   Medicare Observation Status Notification Given:  Yes    Dawayne Patricia, RN 06/03/2018, 2:21 PM

## 2018-06-03 NOTE — Discharge Summary (Signed)
PATIENT DETAILS Name: Sherry Barnett Age: 84 y.o. Sex: female Date of Birth: 12/05/1932 MRN: [id number]. Admitting Physician: Vianne Bulls, MD [id number], Christiane Ha, MD  Admit Date: 06/02/2018 Discharge date: 06/03/2018  Recommendations for Outpatient Follow-up:  1. Follow up with PCP in 1-2 weeks 2. Please obtain BMP/CBC in one week  Admitted From:  Home  Disposition: [place]: No  Equipment/Devices: None  Discharge Condition: Stable  CODE STATUS: FULL CODE  Diet recommendation:  Heart Healthy   Brief Summary: See H&P, Labs, Consult and Test reports for all details in brief, Patient is a 84 y.o. female with history of CAD status post PCI to [name] October 2018-just discharged from this facility on 12/22 for small bowel obstruction, presented with retrosternal chest pain, when EMS arrived-she was found to have A. fib with RVR.  She was subsequently brought to the ED and admitted to the hospitalist service.  See below for further details  Brief Hospital Course: A. fib with RVR:  Maintaining sinus rhythm-continue metoprolol.  Discussed with Dr. Kerry Dory aspirin, continue Plavix (for additional 3 months) and add Eliquis (d/w pharmacy-recommendations are for 5 mg BID dosing).  Chads2vasc of 4.  Chest pain: With typical and atypical features-probably worsened by RVR.  However has underlying CAD.  Underwent nuclear stress test on 12/24-no ischemia seen. Chest pain free at time of d/c.  Had PCI in October 2018-was on dual antiplatelet agents-but with patient now having A. fib-Eliquis has been added-aspirin has been discontinued.  Recommendations from cardiology are to continue with Plavix for additional 3 months.  Patient to follow-up with primary cardiologist for further optimization.  CAD status post PCI to [name] 03/2017: See above  Recent SBO: No abdominal pain or distention.  Had BM earlier today  Procedures/Studies: None  Discharge Diagnoses:    Principal Problem:   Chest pain, rule out acute myocardial infarction Active Problems:   Paroxysmal atrial fibrillation with RVR (HCC)   Coronary artery disease involving native coronary artery of native heart with angina pectoris (HCC)   Hypokalemia   Subconjunctival hemorrhage, non-traumatic, right   Discharge Instructions:  Activity:  As tolerated with Full fall precautions use walker/cane & assistance as needed   Discharge Instructions    Call MD for:  difficulty breathing, headache or visual disturbances   Complete by:  As directed    Diet - low sodium heart healthy   Complete by:  As directed    Discharge instructions   Complete by:  As directed    Follow with Primary MD  Lajean Manes, MD in 1 week  Follow with your cardiologist as instructed.  Please get a complete blood count and chemistry panel checked by your Primary MD at your next visit, and again as instructed by your Primary MD.  Get Medicines reviewed and adjusted: Please take all your medications with you for your next visit with your Primary MD  Laboratory/radiological data: Please request your Primary MD to go over all hospital tests and procedure/radiological results at the follow up, please ask your Primary MD to get all Hospital records sent to his/her office.  In some cases, they will be blood work, cultures and biopsy results pending at the time of your discharge. Please request that your primary care M.D. follows up on these results.  Also Note the following: If you experience worsening of your admission symptoms, develop shortness of breath, life threatening emergency, suicidal or homicidal thoughts you must seek medical attention immediately by calling 911  or calling your MD immediately  if symptoms less severe.  You must read complete instructions/literature along with all the possible adverse reactions/side effects for all the Medicines you take and that have been prescribed to you. Take any new  Medicines after you have completely understood and accpet all the possible adverse reactions/side effects.   Do not drive when taking Pain medications or sleeping medications (Benzodaizepines)  Do not take more than prescribed Pain, Sleep and Anxiety Medications. It is not advisable to combine anxiety,sleep and pain medications without talking with your primary care practitioner  Special Instructions: If you have smoked or chewed Tobacco  in the last 2 yrs please stop smoking, stop any regular Alcohol  and or any Recreational drug use.  Wear Seat belts while driving.  Please note: You were cared for by a hospitalist during your hospital stay. Once you are discharged, your primary care physician will handle any further medical issues. Please note that NO REFILLS for any discharge medications will be authorized once you are discharged, as it is imperative that you return to your primary care physician (or establish a relationship with a primary care physician if you do not have one) for your post hospital discharge needs so that they can reassess your need for medications and monitor your lab values.   Increase activity slowly   Complete by:  As directed      Allergies as of 06/03/2018      Reactions   Sulfa Antibiotics Other (See Comments)   HEMATURIA   Novocain [procaine] Palpitations   Penicillins Rash   Has patient had a PCN reaction causing immediate rash, facial/tongue/throat swelling, SOB or lightheadedness with hypotension: Yes Has patient had a PCN reaction causing severe rash involving mucus membranes or skin necrosis: Unknown Has patient had a PCN reaction that required hospitalization: No Has patient had a PCN reaction occurring within the last 10 years: No If all of the above answers are "NO", then may proceed with Cephalosporin use.      Medication List    STOP taking these medications   aspirin 81 MG EC tablet     TAKE these medications   alendronate 70 MG  tablet Commonly known as:  FOSAMAX Take 70 mg by mouth every Saturday. Take with a full glass of water on an empty stomach.   apixaban 5 MG Tabs tablet Commonly known as:  ELIQUIS Take 1 tablet (5 mg total) by mouth 2 (two) times daily.   artificial tears ointment Place 1 drop into both eyes 4 (four) times daily as needed (dry eyes). Refresh Tears for dry eyes.   atorvastatin 80 MG tablet Commonly known as:  LIPITOR TAKE 1 TABLET BY MOUTH EVERY DAY AT 6:00 PM What changed:  See the new instructions.   calcium carbonate 750 MG chewable tablet Commonly known as:  TUMS EX Chew 1-2 tablets by mouth 2 (two) times daily as needed (for hearburn/indigestion.).   cholecalciferol 1000 units tablet Commonly known as:  VITAMIN D Take 1,000 Units by mouth daily.   clopidogrel 75 MG tablet Commonly known as:  PLAVIX TAKE 1 TABLET BY MOUTH DAILY   famotidine 20 MG tablet Commonly known as:  PEPCID Take 1 tablet (20 mg total) by mouth 2 (two) times daily.   FINACEA 15 % cream Generic drug:  Azelaic Acid Apply 1 application topically 2 (two) times daily. After skin is thoroughly washed and patted dry, gently but thoroughly massage a thin film of azelaic acid cream into  the affected area twice daily, in the morning and evening.   isosorbide mononitrate 60 MG 24 hr tablet Commonly known as:  IMDUR Take 1 tablet (60 mg total) by mouth daily.   metoprolol succinate 50 MG 24 hr tablet Commonly known as:  TOPROL-XL Take 1 tablet (50 mg total) by mouth daily. Take with or immediately following a meal.   nitroGLYCERIN 0.4 MG SL tablet Commonly known as:  NITROSTAT Place 1 tablet (0.4 mg total) under the tongue every 5 (five) minutes as needed for chest pain.   ondansetron 8 MG disintegrating tablet Commonly known as:  ZOFRAN-ODT Take 8 mg by mouth every 12 (twelve) hours as needed for nausea or vomiting.   pantoprazole 40 MG tablet Commonly known as:  PROTONIX Take 1 tablet (40 mg total)  by mouth daily as needed (acid reflux).   sucralfate 1 g tablet Commonly known as:  CARAFATE Take 1 tablet (1 g total) by mouth 4 (four) times daily.   tobramycin-dexamethasone ophthalmic ointment Commonly known as:  TOBRADEX Place 1 application into both eyes See admin instructions. Tuesday AND Saturday patient does not use. Will apply at night on all other nights.      Follow-up Information    Stoneking, Hal, MD. Schedule an appointment as soon as possible for a visit in 1 week(s).   Specialty:  Internal Medicine Contact information: [address] [place] [address] [phone]        Belva Crome, MD .   Specialty:  Cardiology Contact information: [phone] [place] [address] [phone]          Allergies  Allergen Reactions  . Sulfa Antibiotics Other (See Comments)    HEMATURIA  . Novocain [Procaine] Palpitations  . Penicillins Rash    Has patient had a PCN reaction causing immediate rash, facial/tongue/throat swelling, SOB or lightheadedness with hypotension: Yes Has patient had a PCN reaction causing severe rash involving mucus membranes or skin necrosis: Unknown Has patient had a PCN reaction that required hospitalization: No Has patient had a PCN reaction occurring within the last 10 years: No If all of the above answers are "NO", then may proceed with Cephalosporin use.     Consultations:   cardiology   Other Procedures/Studies: Dg Chest 2 View  Result Date: 06/02/2018 CLINICAL DATA:  Chest pain EXAM: CHEST - 2 VIEW COMPARISON:  05/27/2018 FINDINGS: The heart size and mediastinal contours are within normal limits. Both lungs are clear. The visualized skeletal structures are unremarkable. IMPRESSION: No active cardiopulmonary disease. Electronically Signed   By: Inez Catalina M.D.   On: 06/02/2018 02:55   Ct Abdomen Pelvis W Contrast  Result Date: 05/28/2018 CLINICAL DATA:  Abdominal pain, distention,  and nausea and vomiting for several days. Previous appendectomy. EXAM: CT ABDOMEN AND PELVIS WITH CONTRAST TECHNIQUE: Multidetector CT imaging of the abdomen and pelvis was performed using the standard protocol following bolus administration of intravenous contrast. CONTRAST:  118mL ISOVUE-300 IOPAMIDOL (ISOVUE-300) INJECTION 61% COMPARISON:  None. FINDINGS: Lower Chest: No acute findings. Hepatobiliary: No hepatic masses identified. Probable tiny sub-cm cyst in right hepatic lobe. Gallbladder is unremarkable. No evidence of biliary ductal dilatation. Pancreas:  No mass or inflammatory changes. Spleen: Within normal limits in size and appearance. Adrenals/Urinary Tract: No masses identified. Probable tiny sub-cm left renal cyst noted. No evidence of hydronephrosis. Unremarkable unopacified urinary bladder. Stomach/Bowel: Diverticulosis is seen involving the sigmoid colon, however there is no evidence of diverticulitis. Mild dilatation  of multiple mid small bowel loops are seen, with transition point in the anterior mid pelvis (image 28/5). No mass or inflammatory process identified at this transition point, suggesting an adhesion. Vascular/Lymphatic: No pathologically enlarged lymph nodes. No abdominal aortic aneurysm. Aortic atherosclerosis. Reproductive:  No mass or other significant abnormality. Other: Small amount of free fluid seen within the pelvis and bilateral upper quadrants, but no abscess identified. No evidence of free intraperitoneal or other extraluminal air. Musculoskeletal: No suspicious bone lesions identified. Lumbar spine degenerative changes noted. IMPRESSION: Findings consistent with partial small bowel obstruction, with transition point in the anterior mid pelvis suspicious for adhesion. Small amount of free fluid. No evidence of abscess or free intraperitoneal air. Sigmoid diverticulosis, without radiographic evidence of diverticulitis. Electronically Signed   By: Earle Gell M.D.   On:  05/28/2018 19:14   Nm Myocar Multi W/spect W/wall Motion / Ef  Result Date: 06/03/2018 CLINICAL DATA:  84 year old female with atrial fibrillation. Prior LEFT heart catheterization and angioplasty. EXAM: MYOCARDIAL IMAGING WITH SPECT (REST AND PHARMACOLOGIC-STRESS) GATED LEFT VENTRICULAR WALL MOTION STUDY LEFT VENTRICULAR EJECTION FRACTION TECHNIQUE: Standard myocardial SPECT imaging was performed after resting intravenous injection of 10 mCi Tc-71m tetrofosmin. Subsequently, intravenous infusion of Lexiscan was performed under the supervision of the Cardiology staff. At peak effect of the drug, 30 mCi Tc-42m tetrofosmin was injected intravenously and standard myocardial SPECT imaging was performed. Quantitative gated imaging was also performed to evaluate left ventricular wall motion, and estimate left ventricular ejection fraction. COMPARISON:  None. FINDINGS: EKG interpretation: No ST changes during pharmacological stress. Perfusion: Is decreased counts within the inferior wall and inferoseptal wall involving the apical and basilar segments which are fixed on rest and stress. Mild decreased counts within the anterior wall which also fixed on rest and stress. No evidence reversible ischemia. Wall Motion: Normal left ventricular wall motion. No left ventricular dilation. Left Ventricular Ejection Fraction: 48 % End diastolic volume 60 ml End systolic volume 32 ml IMPRESSION: 1. No reversible ischemia. Concern for infarction in the inferoseptal wall. Infarction versus breast attenuation anterior wall. 2. Normal left ventricular wall motion. Wall motion difficult to assess due to motion. 3. Left ventricular ejection fraction 48% 4. Non invasive risk stratification*: Intermediate (risk stratification primarily weighted to low EF) *2012 Appropriate Use Criteria for Coronary Revascularization Focused Update: J Am Coll Cardiol. 3710;62(6):948-546. http://content.airportbarriers.com.aspx?articleid=1201161  Electronically Signed   By: Suzy Bouchard M.D.   On: 06/03/2018 15:47   Dg Abd Acute W/chest  Result Date: 05/27/2018 CLINICAL DATA:  Vomiting and abdominal pain. EXAM: DG ABDOMEN ACUTE W/ 1V CHEST COMPARISON:  None. FINDINGS: The cardiomediastinal contours are normal. The lungs are clear. There is no free intra-abdominal air. No dilated bowel loops to suggest obstruction. Small volume of stool throughout the colon. Calcifications in the left upper quadrant may be vascular but are poorly localized. No acute osseous abnormalities are seen. IMPRESSION: 1. Normal bowel gas pattern. No free air. 2. Rounded calcifications projecting over the left upper quadrant are poorly localized radiographically, may be vascular. 3. No acute pulmonary process. Electronically Signed   By: Keith Rake M.D.   On: 05/27/2018 01:40   Dg Abd Portable 1v  Result Date: 05/30/2018 CLINICAL DATA:  Follow-up small bowel obstruction EXAM: PORTABLE ABDOMEN - 1 VIEW COMPARISON:  05/29/2018 FINDINGS: Interval removal of the nasogastric tube. Mild gaseous distension of small bowel. Air is seen within the colon. Oral contrast material is present within the colon. There is no bowel dilatation to suggest obstruction.  There is no evidence of pneumoperitoneum, portal venous gas or pneumatosis. There are no pathologic calcifications along the expected course of the ureters. The osseous structures are unremarkable. IMPRESSION: No bowel dilatation to suggest high-grade obstruction. Mild persistent gaseous distension of small bowel with air seen within the colon. Oral contrast material is again noted within the colon as well. Electronically Signed   By: Kathreen Devoid   On: 05/30/2018 13:15   Dg Abd Portable 1v  Result Date: 05/29/2018 CLINICAL DATA:  Small-bowel obstruction EXAM: PORTABLE ABDOMEN - 1 VIEW COMPARISON:  None. FINDINGS: There is enteric contrast now within the ascending colon. Nasogastric tube tip and side port project over  the gastric body. No dilated small bowel. IMPRESSION: Enteric contrast now within the ascending colon.No dilated bowel visualized. Electronically Signed   By: Ulyses Jarred M.D.   On: 05/29/2018 23:31   Dg Abd Portable 1v-small Bowel Obstruction Protocol-initial, 8 Hr Delay  Result Date: 05/29/2018 CLINICAL DATA:  Small bowel obstruction. EXAM: PORTABLE ABDOMEN - 1 VIEW COMPARISON:  05/28/2018 FINDINGS: NG tube is noted in the stomach. Contrast material from prior CT noted in the urinary bladder. Contrast noted within small bowel loops. Dilated small bowel loops. No organomegaly or free air. Gas and stool noted within nondistended colon. IMPRESSION: Gas and oral contrast material noted within the small bowel which is dilated compatible with small bowel obstruction. No definite passage of contrast material into the colon. Electronically Signed   By: Rolm Baptise M.D.   On: 05/29/2018 07:30   Dg Abd Portable 1 View  Result Date: 05/28/2018 CLINICAL DATA:  NG tube placement EXAM: PORTABLE ABDOMEN - 1 VIEW COMPARISON:  05/27/2018 radiographs and CT abdomen and pelvis 05/28/2018 FINDINGS: A gastric tube is seen with tip and side port below the left hemidiaphragm in the expected location of the stomach. Opacification of the renal collecting system and included bladder from recent contrast administration. Mild levoconvex curvature of the lumbar spine with degenerative disc and facet arthropathy. Mild diffuse gaseous distention of small bowel loops in the lower abdomen pelvis compatible with findings on recent CT and suggestive of partial SBO. IMPRESSION: No significant change from 05/27/2018. Electronically Signed   By: Ashley Royalty M.D.   On: 05/28/2018 20:41      TODAY-DAY OF DISCHARGE:  Subjective:   Aurielle Knowles today has no headache,no chest abdominal pain,no new weakness tingling or numbness, feels much better wants to go home today.   Objective:   Blood pressure (!) 122/48, pulse 72, temperature  98.4 F (36.9 C), temperature source Oral, resp. rate 18, height 5' 2.5" (1.588 m), weight 61.3 kg, SpO2 98 %.  Intake/Output Summary (Last 24 hours) at 06/03/2018 1556 Last data filed at 06/03/2018 8938 Gross per 24 hour  Intake 0 ml  Output -  Net 0 ml   Filed Weights   06/02/18 0209 06/02/18 0516 06/03/18 0538  Weight: 61.7 kg 62 kg 61.3 kg    Exam: Awake Alert, Oriented *3, No new F.N deficits, Normal affect Gregory.AT,PERRAL Supple Neck,No JVD, No cervical lymphadenopathy appriciated.  Symmetrical Chest wall movement, Good air movement bilaterally, CTAB RRR,No Gallops,Rubs or new Murmurs, No Parasternal Heave +ve B.Sounds, Abd Soft, Non tender, No organomegaly appriciated, No rebound -guarding or rigidity. No Cyanosis, Clubbing or edema, No new Rash or bruise   PERTINENT RADIOLOGIC STUDIES: Dg Chest 2 View  Result Date: 06/02/2018 CLINICAL DATA:  Chest pain EXAM: CHEST - 2 VIEW COMPARISON:  05/27/2018 FINDINGS: The heart size and mediastinal  contours are within normal limits. Both lungs are clear. The visualized skeletal structures are unremarkable. IMPRESSION: No active cardiopulmonary disease. Electronically Signed   By: Inez Catalina M.D.   On: 06/02/2018 02:55   Ct Abdomen Pelvis W Contrast  Result Date: 05/28/2018 CLINICAL DATA:  Abdominal pain, distention, and nausea and vomiting for several days. Previous appendectomy. EXAM: CT ABDOMEN AND PELVIS WITH CONTRAST TECHNIQUE: Multidetector CT imaging of the abdomen and pelvis was performed using the standard protocol following bolus administration of intravenous contrast. CONTRAST:  129mL ISOVUE-300 IOPAMIDOL (ISOVUE-300) INJECTION 61% COMPARISON:  None. FINDINGS: Lower Chest: No acute findings. Hepatobiliary: No hepatic masses identified. Probable tiny sub-cm cyst in right hepatic lobe. Gallbladder is unremarkable. No evidence of biliary ductal dilatation. Pancreas:  No mass or inflammatory changes. Spleen: Within normal limits in  size and appearance. Adrenals/Urinary Tract: No masses identified. Probable tiny sub-cm left renal cyst noted. No evidence of hydronephrosis. Unremarkable unopacified urinary bladder. Stomach/Bowel: Diverticulosis is seen involving the sigmoid colon, however there is no evidence of diverticulitis. Mild dilatation of multiple mid small bowel loops are seen, with transition point in the anterior mid pelvis (image 28/5). No mass or inflammatory process identified at this transition point, suggesting an adhesion. Vascular/Lymphatic: No pathologically enlarged lymph nodes. No abdominal aortic aneurysm. Aortic atherosclerosis. Reproductive:  No mass or other significant abnormality. Other: Small amount of free fluid seen within the pelvis and bilateral upper quadrants, but no abscess identified. No evidence of free intraperitoneal or other extraluminal air. Musculoskeletal: No suspicious bone lesions identified. Lumbar spine degenerative changes noted. IMPRESSION: Findings consistent with partial small bowel obstruction, with transition point in the anterior mid pelvis suspicious for adhesion. Small amount of free fluid. No evidence of abscess or free intraperitoneal air. Sigmoid diverticulosis, without radiographic evidence of diverticulitis. Electronically Signed   By: Earle Gell M.D.   On: 05/28/2018 19:14   Nm Myocar Multi W/spect W/wall Motion / Ef  Result Date: 06/03/2018 CLINICAL DATA:  84 year old female with atrial fibrillation. Prior LEFT heart catheterization and angioplasty. EXAM: MYOCARDIAL IMAGING WITH SPECT (REST AND PHARMACOLOGIC-STRESS) GATED LEFT VENTRICULAR WALL MOTION STUDY LEFT VENTRICULAR EJECTION FRACTION TECHNIQUE: Standard myocardial SPECT imaging was performed after resting intravenous injection of 10 mCi Tc-54m tetrofosmin. Subsequently, intravenous infusion of Lexiscan was performed under the supervision of the Cardiology staff. At peak effect of the drug, 30 mCi Tc-46m tetrofosmin was  injected intravenously and standard myocardial SPECT imaging was performed. Quantitative gated imaging was also performed to evaluate left ventricular wall motion, and estimate left ventricular ejection fraction. COMPARISON:  None. FINDINGS: EKG interpretation: No ST changes during pharmacological stress. Perfusion: Is decreased counts within the inferior wall and inferoseptal wall involving the apical and basilar segments which are fixed on rest and stress. Mild decreased counts within the anterior wall which also fixed on rest and stress. No evidence reversible ischemia. Wall Motion: Normal left ventricular wall motion. No left ventricular dilation. Left Ventricular Ejection Fraction: 48 % End diastolic volume 60 ml End systolic volume 32 ml IMPRESSION: 1. No reversible ischemia. Concern for infarction in the inferoseptal wall. Infarction versus breast attenuation anterior wall. 2. Normal left ventricular wall motion. Wall motion difficult to assess due to motion. 3. Left ventricular ejection fraction 48% 4. Non invasive risk stratification*: Intermediate (risk stratification primarily weighted to low EF) *2012 Appropriate Use Criteria for Coronary Revascularization Focused Update: J Am Coll Cardiol. 1610;96(0):454-098. http://content.airportbarriers.com.aspx?articleid=1201161 Electronically Signed   By: Suzy Bouchard M.D.   On: 06/03/2018 15:47  Dg Abd Acute W/chest  Result Date: 05/27/2018 CLINICAL DATA:  Vomiting and abdominal pain. EXAM: DG ABDOMEN ACUTE W/ 1V CHEST COMPARISON:  None. FINDINGS: The cardiomediastinal contours are normal. The lungs are clear. There is no free intra-abdominal air. No dilated bowel loops to suggest obstruction. Small volume of stool throughout the colon. Calcifications in the left upper quadrant may be vascular but are poorly localized. No acute osseous abnormalities are seen. IMPRESSION: 1. Normal bowel gas pattern. No free air. 2. Rounded calcifications projecting  over the left upper quadrant are poorly localized radiographically, may be vascular. 3. No acute pulmonary process. Electronically Signed   By: Keith Rake M.D.   On: 05/27/2018 01:40   Dg Abd Portable 1v  Result Date: 05/30/2018 CLINICAL DATA:  Follow-up small bowel obstruction EXAM: PORTABLE ABDOMEN - 1 VIEW COMPARISON:  05/29/2018 FINDINGS: Interval removal of the nasogastric tube. Mild gaseous distension of small bowel. Air is seen within the colon. Oral contrast material is present within the colon. There is no bowel dilatation to suggest obstruction. There is no evidence of pneumoperitoneum, portal venous gas or pneumatosis. There are no pathologic calcifications along the expected course of the ureters. The osseous structures are unremarkable. IMPRESSION: No bowel dilatation to suggest high-grade obstruction. Mild persistent gaseous distension of small bowel with air seen within the colon. Oral contrast material is again noted within the colon as well. Electronically Signed   By: Kathreen Devoid   On: 05/30/2018 13:15   Dg Abd Portable 1v  Result Date: 05/29/2018 CLINICAL DATA:  Small-bowel obstruction EXAM: PORTABLE ABDOMEN - 1 VIEW COMPARISON:  None. FINDINGS: There is enteric contrast now within the ascending colon. Nasogastric tube tip and side port project over the gastric body. No dilated small bowel. IMPRESSION: Enteric contrast now within the ascending colon.No dilated bowel visualized. Electronically Signed   By: Ulyses Jarred M.D.   On: 05/29/2018 23:31   Dg Abd Portable 1v-small Bowel Obstruction Protocol-initial, 8 Hr Delay  Result Date: 05/29/2018 CLINICAL DATA:  Small bowel obstruction. EXAM: PORTABLE ABDOMEN - 1 VIEW COMPARISON:  05/28/2018 FINDINGS: NG tube is noted in the stomach. Contrast material from prior CT noted in the urinary bladder. Contrast noted within small bowel loops. Dilated small bowel loops. No organomegaly or free air. Gas and stool noted within nondistended  colon. IMPRESSION: Gas and oral contrast material noted within the small bowel which is dilated compatible with small bowel obstruction. No definite passage of contrast material into the colon. Electronically Signed   By: Rolm Baptise M.D.   On: 05/29/2018 07:30   Dg Abd Portable 1 View  Result Date: 05/28/2018 CLINICAL DATA:  NG tube placement EXAM: PORTABLE ABDOMEN - 1 VIEW COMPARISON:  05/27/2018 radiographs and CT abdomen and pelvis 05/28/2018 FINDINGS: A gastric tube is seen with tip and side port below the left hemidiaphragm in the expected location of the stomach. Opacification of the renal collecting system and included bladder from recent contrast administration. Mild levoconvex curvature of the lumbar spine with degenerative disc and facet arthropathy. Mild diffuse gaseous distention of small bowel loops in the lower abdomen pelvis compatible with findings on recent CT and suggestive of partial SBO. IMPRESSION: No significant change from 05/27/2018. Electronically Signed   By: Ashley Royalty M.D.   On: 05/28/2018 20:41     PERTINENT LAB RESULTS: CBC: Recent Labs    06/02/18 0323 06/03/18 0450  WBC 9.9 7.7  HGB 13.7 12.1  HCT 41.4 36.2  PLT 195 191  CMET CMP     Component Value Date/Time   NA 136 06/03/2018 0450   NA 142 08/02/2017 1304   K 3.8 06/03/2018 0450   CL 105 06/03/2018 0450   CO2 21 (L) 06/03/2018 0450   GLUCOSE 95 06/03/2018 0450   BUN 8 06/03/2018 0450   BUN 13 08/02/2017 1304   CREATININE 0.88 06/03/2018 0450   CALCIUM 8.1 (L) 06/03/2018 0450   PROT 6.0 (L) 06/02/2018 0224   PROT 6.4 05/23/2017 1007   ALBUMIN 3.2 (L) 06/02/2018 0224   ALBUMIN 4.0 05/23/2017 1007   AST 29 06/02/2018 0224   ALT 21 06/02/2018 0224   ALKPHOS 58 06/02/2018 0224   BILITOT 1.1 06/02/2018 0224   BILITOT 0.3 05/23/2017 1007   GFRNONAA >60 06/03/2018 0450   GFRAA >60 06/03/2018 0450    GFR Estimated Creatinine Clearance: 39.2 mL/min (by C-G formula based on SCr of 0.88  mg/dL). No results for input(s): LIPASE, AMYLASE in the last 72 hours. Recent Labs    06/02/18 0757 06/02/18 1420  TROPONINI 0.13* 0.09*   Invalid input(s): POCBNP No results for input(s): DDIMER in the last 72 hours. No results for input(s): HGBA1C in the last 72 hours. No results for input(s): CHOL, HDL, LDLCALC, TRIG, CHOLHDL, LDLDIRECT in the last 72 hours. No results for input(s): TSH, T4TOTAL, T3FREE, THYROIDAB in the last 72 hours.  Invalid input(s): FREET3 No results for input(s): VITAMINB12, FOLATE, FERRITIN, TIBC, IRON, RETICCTPCT in the last 72 hours. Coags: No results for input(s): INR in the last 72 hours.  Invalid input(s): PT Microbiology: No results found for this or any previous visit (from the past 240 hour(s)).  FURTHER DISCHARGE INSTRUCTIONS:  Get Medicines reviewed and adjusted: Please take all your medications with you for your next visit with your Primary MD  Laboratory/radiological data: Please request your Primary MD to go over all hospital tests and procedure/radiological results at the follow up, please ask your Primary MD to get all Hospital records sent to his/her office.  In some cases, they will be blood work, cultures and biopsy results pending at the time of your discharge. Please request that your primary care M.D. goes through all the records of your hospital data and follows up on these results.  Also Note the following: If you experience worsening of your admission symptoms, develop shortness of breath, life threatening emergency, suicidal or homicidal thoughts you must seek medical attention immediately by calling 911 or calling your MD immediately  if symptoms less severe.  You must read complete instructions/literature along with all the possible adverse reactions/side effects for all the Medicines you take and that have been prescribed to you. Take any new Medicines after you have completely understood and accpet all the possible adverse  reactions/side effects.   Do not drive when taking Pain medications or sleeping medications (Benzodaizepines)  Do not take more than prescribed Pain, Sleep and Anxiety Medications. It is not advisable to combine anxiety,sleep and pain medications without talking with your primary care practitioner  Special Instructions: If you have smoked or chewed Tobacco  in the last 2 yrs please stop smoking, stop any regular Alcohol  and or any Recreational drug use.  Wear Seat belts while driving.  Please note: You were cared for by a hospitalist during your hospital stay. Once you are discharged, your primary care physician will handle any further medical issues. Please note that NO REFILLS for any discharge medications will be authorized once you are discharged, as it is imperative  that you return to your primary care physician (or establish a relationship with a primary care physician if you do not have one) for your post hospital discharge needs so that they can reassess your need for medications and monitor your lab values.  Total Time spent coordinating discharge including counseling, education and face to face time equals 25 minutes.  SignedOren Binet 06/03/2018 3:56 PM

## 2018-06-03 NOTE — Progress Notes (Signed)
   Louellen E Cicio presented for a nuclear stress test today.  No immediate complications.  Stress imaging is pending at this time.  Preliminary EKG findings may be listed in the chart, but the stress test result will not be finalized until perfusion imaging is complete.   Abigail Butts, PA-C 06/03/2018, 1:27 PM

## 2018-06-03 NOTE — Progress Notes (Signed)
PROGRESS NOTE        PATIENT DETAILS Name: Sherry Barnett Age: 84 y.o. Sex: female Date of Birth: 1932-12-06 Admit Date: 06/02/2018 Admitting Physician Vianne Bulls, MD BTD:[id number], Christiane Ha, MD  Brief Narrative: Patient is a 84 y.o. female with history of CAD status post PCI to [name] October 2018-just discharged from this facility on 12/22 for small bowel obstruction, presented with retrosternal chest pain, when EMS arrived-she was found to have A. fib with RVR.  She was subsequently brought to the ED and admitted to the hospitalist service.  See below for further details  Subjective: No chest or abdominal pain.  Had numerous bowel movements overnight-no diarrhea.  Assessment/Plan: A. fib with RVR: During sinus rhythm-continue metoprolol.  Discussed with Dr. Harding-cardiology-we will await results of stress test-but probably will discontinue aspirin, continue Plavix and add Eliquis.  Chads2vasc of 4.  Chest pain: With typical and atypical features-probably worsened by RVR.  However has underlying CAD.  Underwent nuclear stress test on 12/24-pending results.  Chest pain-free.   CAD status post PCI to [name] 03/2017: See above  Recent SBO: No abdominal pain or distention.  Had BM earlier today.  DVT Prophylaxis: Prophylactic Lovenox   Code Status: Full code  Family Communication: None at bedside  Disposition Plan: Remain inpatient  Antimicrobial agents: Anti-infectives (From admission, onward)   None      Procedures: None  CONSULTS:  cardiology  Time spent: 25- minutes-Greater than 50% of this time was spent in counseling, explanation of diagnosis, planning of further management, and coordination of care.  MEDICATIONS: Scheduled Meds: . aspirin EC  81 mg Oral Q breakfast  . atorvastatin  80 mg Oral QPM  . clopidogrel  75 mg Oral Daily  . enoxaparin (LOVENOX) injection  40 mg Subcutaneous Daily  . famotidine  20 mg Oral BID  . isosorbide  mononitrate  60 mg Oral Daily  . metoprolol succinate  50 mg Oral Daily  . nitroGLYCERIN      . pantoprazole  40 mg Oral Daily  . sucralfate  1 g Oral QID   Continuous Infusions: PRN Meds:.acetaminophen, calcium carbonate, nitroGLYCERIN, ondansetron (ZOFRAN) IV   PHYSICAL EXAM: Vital signs: Vitals:   06/03/18 1236 06/03/18 1237 06/03/18 1240 06/03/18 1245  BP:  (!) 139/56 (!) 146/58 (!) 146/64  Pulse: 82 82 79 71  Resp:      Temp:      TempSrc:      SpO2:      Weight:      Height:       Filed Weights   06/02/18 0209 06/02/18 0516 06/03/18 0538  Weight: 61.7 kg 62 kg 61.3 kg   Body mass index is 24.33 kg/m.   General appearance:Awake, alert, not in any distress.  Eyes:no scleral icterus. HEENT: Atraumatic and Normocephalic Neck: supple, no JVD. Resp:Good air entry bilaterally,no rales or rhonchi CVS: S1 S2 regular GI: Bowel sounds present, Non tender and not distended with no gaurding, rigidity or rebound. Extremities: B/L Lower Ext shows no edema, both legs are warm to touch Neurology:  Non focal Musculoskeletal:No digital cyanosis Skin:No Rash, warm and dry Wounds:N/A  I have personally reviewed following labs and imaging studies  LABORATORY DATA: CBC: Recent Labs  Lab 05/28/18 1600 05/29/18 0525 06/02/18 0323 06/03/18 0450  WBC 10.8* 10.7* 9.9 7.7  NEUTROABS  --   --  6.7  --   HGB 13.3 12.7 13.7 12.1  HCT 41.6 40.2 41.4 36.2  MCV 97.4 98.8 95.2 92.6  PLT 216 200 195 950    Basic Metabolic Panel: Recent Labs  Lab 05/29/18 0525 05/30/18 0521 05/31/18 0450 06/02/18 0224 06/03/18 0450  NA 139 139 135 138 136  K 3.3* 4.4 3.2* 3.1* 3.8  CL 107 104 102 100 105  CO2 23 26 29  21* 21*  GLUCOSE 92 141* 96 109* 95  BUN 16 14 15 9 8   CREATININE 0.83 0.87 0.75 0.77 0.88  CALCIUM 8.5* 8.2* 7.8* 8.7* 8.1*  MG 2.3  --   --   --   --     GFR: Estimated Creatinine Clearance: 39.2 mL/min (by C-G formula based on SCr of 0.88 mg/dL).  Liver Function  Tests: Recent Labs  Lab 05/28/18 1600 06/02/18 0224  AST 28 29  ALT 27 21  ALKPHOS 61 58  BILITOT 1.1 1.1  PROT 6.3* 6.0*  ALBUMIN 4.0 3.2*   Recent Labs  Lab 05/28/18 1600  LIPASE 27   No results for input(s): AMMONIA in the last 168 hours.  Coagulation Profile: No results for input(s): INR, PROTIME in the last 168 hours.  Cardiac Enzymes: Recent Labs  Lab 06/02/18 0757 06/02/18 1420  TROPONINI 0.13* 0.09*    BNP (last 3 results) No results for input(s): PROBNP in the last 8760 hours.  HbA1C: No results for input(s): HGBA1C in the last 72 hours.  CBG: No results for input(s): GLUCAP in the last 168 hours.  Lipid Profile: No results for input(s): CHOL, HDL, LDLCALC, TRIG, CHOLHDL, LDLDIRECT in the last 72 hours.  Thyroid Function Tests: No results for input(s): TSH, T4TOTAL, FREET4, T3FREE, THYROIDAB in the last 72 hours.  Anemia Panel: No results for input(s): VITAMINB12, FOLATE, FERRITIN, TIBC, IRON, RETICCTPCT in the last 72 hours.  Urine analysis:    Component Value Date/Time   COLORURINE YELLOW 05/28/2018 [place] 05/28/2018 1755   LABSPEC 1.018 05/28/2018 1755   PHURINE 6.0 05/28/2018 1755   GLUCOSEU NEGATIVE 05/28/2018 1755   HGBUR LARGE (A) 05/28/2018 [place] NEGATIVE 05/28/2018 1755   KETONESUR 80 (A) 05/28/2018 1755   PROTEINUR NEGATIVE 05/28/2018 1755   NITRITE NEGATIVE 05/28/2018 1755   LEUKOCYTESUR TRACE (A) 05/28/2018 1755    Sepsis Labs: Lactic Acid, Venous No results found for: LATICACIDVEN  MICROBIOLOGY: No results found for this or any previous visit (from the past 240 hour(s)).  RADIOLOGY STUDIES/RESULTS: Dg Chest 2 View  Result Date: 06/02/2018 CLINICAL DATA:  Chest pain EXAM: CHEST - 2 VIEW COMPARISON:  05/27/2018 FINDINGS: The heart size and mediastinal contours are within normal limits. Both lungs are clear. The visualized skeletal structures are unremarkable. IMPRESSION: No active  cardiopulmonary disease. Electronically Signed   By: Inez Catalina M.D.   On: 06/02/2018 02:55   Ct Abdomen Pelvis W Contrast  Result Date: 05/28/2018 CLINICAL DATA:  Abdominal pain, distention, and nausea and vomiting for several days. Previous appendectomy. EXAM: CT ABDOMEN AND PELVIS WITH CONTRAST TECHNIQUE: Multidetector CT imaging of the abdomen and pelvis was performed using the standard protocol following bolus administration of intravenous contrast. CONTRAST:  128mL ISOVUE-300 IOPAMIDOL (ISOVUE-300) INJECTION 61% COMPARISON:  None. FINDINGS: Lower Chest: No acute findings. Hepatobiliary: No hepatic masses identified. Probable tiny sub-cm cyst in right hepatic lobe. Gallbladder is unremarkable. No evidence of biliary ductal dilatation. Pancreas:  No mass or inflammatory changes. Spleen: Within normal limits in size and  appearance. Adrenals/Urinary Tract: No masses identified. Probable tiny sub-cm left renal cyst noted. No evidence of hydronephrosis. Unremarkable unopacified urinary bladder. Stomach/Bowel: Diverticulosis is seen involving the sigmoid colon, however there is no evidence of diverticulitis. Mild dilatation of multiple mid small bowel loops are seen, with transition point in the anterior mid pelvis (image 28/5). No mass or inflammatory process identified at this transition point, suggesting an adhesion. Vascular/Lymphatic: No pathologically enlarged lymph nodes. No abdominal aortic aneurysm. Aortic atherosclerosis. Reproductive:  No mass or other significant abnormality. Other: Small amount of free fluid seen within the pelvis and bilateral upper quadrants, but no abscess identified. No evidence of free intraperitoneal or other extraluminal air. Musculoskeletal: No suspicious bone lesions identified. Lumbar spine degenerative changes noted. IMPRESSION: Findings consistent with partial small bowel obstruction, with transition point in the anterior mid pelvis suspicious for adhesion. Small  amount of free fluid. No evidence of abscess or free intraperitoneal air. Sigmoid diverticulosis, without radiographic evidence of diverticulitis. Electronically Signed   By: Earle Gell M.D.   On: 05/28/2018 19:14   Dg Abd Acute W/chest  Result Date: 05/27/2018 CLINICAL DATA:  Vomiting and abdominal pain. EXAM: DG ABDOMEN ACUTE W/ 1V CHEST COMPARISON:  None. FINDINGS: The cardiomediastinal contours are normal. The lungs are clear. There is no free intra-abdominal air. No dilated bowel loops to suggest obstruction. Small volume of stool throughout the colon. Calcifications in the left upper quadrant may be vascular but are poorly localized. No acute osseous abnormalities are seen. IMPRESSION: 1. Normal bowel gas pattern. No free air. 2. Rounded calcifications projecting over the left upper quadrant are poorly localized radiographically, may be vascular. 3. No acute pulmonary process. Electronically Signed   By: Keith Rake M.D.   On: 05/27/2018 01:40   Dg Abd Portable 1v  Result Date: 05/30/2018 CLINICAL DATA:  Follow-up small bowel obstruction EXAM: PORTABLE ABDOMEN - 1 VIEW COMPARISON:  05/29/2018 FINDINGS: Interval removal of the nasogastric tube. Mild gaseous distension of small bowel. Air is seen within the colon. Oral contrast material is present within the colon. There is no bowel dilatation to suggest obstruction. There is no evidence of pneumoperitoneum, portal venous gas or pneumatosis. There are no pathologic calcifications along the expected course of the ureters. The osseous structures are unremarkable. IMPRESSION: No bowel dilatation to suggest high-grade obstruction. Mild persistent gaseous distension of small bowel with air seen within the colon. Oral contrast material is again noted within the colon as well. Electronically Signed   By: Kathreen Devoid   On: 05/30/2018 13:15   Dg Abd Portable 1v  Result Date: 05/29/2018 CLINICAL DATA:  Small-bowel obstruction EXAM: PORTABLE ABDOMEN -  1 VIEW COMPARISON:  None. FINDINGS: There is enteric contrast now within the ascending colon. Nasogastric tube tip and side port project over the gastric body. No dilated small bowel. IMPRESSION: Enteric contrast now within the ascending colon.No dilated bowel visualized. Electronically Signed   By: Ulyses Jarred M.D.   On: 05/29/2018 23:31   Dg Abd Portable 1v-small Bowel Obstruction Protocol-initial, 8 Hr Delay  Result Date: 05/29/2018 CLINICAL DATA:  Small bowel obstruction. EXAM: PORTABLE ABDOMEN - 1 VIEW COMPARISON:  05/28/2018 FINDINGS: NG tube is noted in the stomach. Contrast material from prior CT noted in the urinary bladder. Contrast noted within small bowel loops. Dilated small bowel loops. No organomegaly or free air. Gas and stool noted within nondistended colon. IMPRESSION: Gas and oral contrast material noted within the small bowel which is dilated compatible with small bowel obstruction.  No definite passage of contrast material into the colon. Electronically Signed   By: Rolm Baptise M.D.   On: 05/29/2018 07:30   Dg Abd Portable 1 View  Result Date: 05/28/2018 CLINICAL DATA:  NG tube placement EXAM: PORTABLE ABDOMEN - 1 VIEW COMPARISON:  05/27/2018 radiographs and CT abdomen and pelvis 05/28/2018 FINDINGS: A gastric tube is seen with tip and side port below the left hemidiaphragm in the expected location of the stomach. Opacification of the renal collecting system and included bladder from recent contrast administration. Mild levoconvex curvature of the lumbar spine with degenerative disc and facet arthropathy. Mild diffuse gaseous distention of small bowel loops in the lower abdomen pelvis compatible with findings on recent CT and suggestive of partial SBO. IMPRESSION: No significant change from 05/27/2018. Electronically Signed   By: Ashley Royalty M.D.   On: 05/28/2018 20:41     LOS: 0 days   Oren Binet, MD  [facility]  If 7PM-7AM, please contact  night-coverage  Please page via [email or web address]-click on MD name and type text message  06/03/2018, 2:00 PM

## 2018-06-03 NOTE — Progress Notes (Addendum)
Progress Note  Patient Name: Sherry Barnett Date of Encounter: 06/03/2018  Primary Cardiologist: Sinclair Grooms, MD   Subjective   No recurrent chest pain today. Denies SOB or palpitations.   Inpatient Medications    Scheduled Meds: . aspirin EC  81 mg Oral Q breakfast  . atorvastatin  80 mg Oral QPM  . clopidogrel  75 mg Oral Daily  . enoxaparin (LOVENOX) injection  40 mg Subcutaneous Daily  . famotidine  20 mg Oral BID  . isosorbide mononitrate  60 mg Oral Daily  . metoprolol succinate  50 mg Oral Daily  . nitroGLYCERIN      . pantoprazole  40 mg Oral Daily  . sucralfate  1 g Oral QID   Continuous Infusions:  PRN Meds: acetaminophen, calcium carbonate, nitroGLYCERIN, ondansetron (ZOFRAN) IV   Vital Signs    Vitals:   06/03/18 1236 06/03/18 1237 06/03/18 1240 06/03/18 1245  BP:  (!) 139/56 (!) 146/58 (!) 146/64  Pulse: 82 82 79 71  Resp:      Temp:      TempSrc:      SpO2:      Weight:      Height:        Intake/Output Summary (Last 24 hours) at 06/03/2018 1305 Last data filed at 06/03/2018 4098 Gross per 24 hour  Intake 0 ml  Output -  Net 0 ml   Filed Weights   06/02/18 0209 06/02/18 0516 06/03/18 0538  Weight: 61.7 kg 62 kg 61.3 kg    Telemetry    Not reviewed as patient seen in nuclear medicine - Personally Reviewed  Physical Exam   GEN: Thin elderly female laying on stretcher in no acute distress.   Neck: No JVD, no carotid bruits Cardiac: RRR, no murmurs, rubs, or gallops.  Respiratory: Clear to auscultation bilaterally, no wheezes/ rales/ rhonchi GI: NABS, Soft, nontender, non-distended  MS: No edema; No deformity. Neuro:  Nonfocal, moving all extremities spontaneously Psych: Normal affect   Labs    Chemistry Recent Labs  Lab 05/28/18 1600  05/31/18 0450 06/02/18 0224 06/03/18 0450  NA 136   < > 135 138 136  K 3.7   < > 3.2* 3.1* 3.8  CL 99   < > 102 100 105  CO2 26   < > 29 21* 21*  GLUCOSE 109*   < > 96 109* 95  BUN  16   < > 15 9 8   CREATININE 0.91   < > 0.75 0.77 0.88  CALCIUM 8.9   < > 7.8* 8.7* 8.1*  PROT 6.3*  --   --  6.0*  --   ALBUMIN 4.0  --   --  3.2*  --   AST 28  --   --  29  --   ALT 27  --   --  21  --   ALKPHOS 61  --   --  58  --   BILITOT 1.1  --   --  1.1  --   GFRNONAA 58*   < > >60 >60 >60  GFRAA >60   < > >60 >60 >60  ANIONGAP 11   < > 4* 17* 10   < > = values in this interval not displayed.     Hematology Recent Labs  Lab 05/29/18 0525 06/02/18 0323 06/03/18 0450  WBC 10.7* 9.9 7.7  RBC 4.07 4.35 3.91  HGB 12.7 13.7 12.1  HCT 40.2 41.4 36.2  MCV  98.8 95.2 92.6  MCH 31.2 31.5 30.9  MCHC 31.6 33.1 33.4  RDW 13.7 13.1 13.3  PLT 200 195 191    Cardiac Enzymes Recent Labs  Lab 06/02/18 0757 06/02/18 1420  TROPONINI 0.13* 0.09*    Recent Labs  Lab 05/28/18 1609 06/02/18 0223  TROPIPOC 0.02 0.06     BNPNo results for input(s): BNP, PROBNP in the last 168 hours.   DDimer No results for input(s): DDIMER in the last 168 hours.   Radiology    Dg Chest 2 View  Result Date: 06/02/2018 CLINICAL DATA:  Chest pain EXAM: CHEST - 2 VIEW COMPARISON:  05/27/2018 FINDINGS: The heart size and mediastinal contours are within normal limits. Both lungs are clear. The visualized skeletal structures are unremarkable. IMPRESSION: No active cardiopulmonary disease. Electronically Signed   By: Inez Catalina M.D.   On: 06/02/2018 02:55    Cardiac Studies   LHC 08/2017  Conclusion     Ost 3rd Mrg lesion is 85% stenosed.   Left main without evidence of significant obstruction  [name] contains mid and distal vessel significant tortuosity before wrapping around the left ventricular apex. The previously placed proximal to mid [name] stent is widely patent. The first dominant diagonal contains eccentric ostial to proximal 60-70% narrowing. The [name] beyond the diagonal is calcified and contains eccentric 50-70% narrowing.  Circumflex contains proximal and mid luminal  irregularities. Distally before the origin of the dominant obtuse marginal branch there is 50% narrowing. The continuation of the circumflex beyond the second obtuse marginal contains ostial 90% narrowing. This is unchanged from the prior angiogram.  The right coronary contains 30-40% ostial narrowing. No catheter dampening is noted. The vessel contains tortuosity. The mid vessel after the acute marginal branch contains 50-70% narrowing. This is unchanged from prior. The PDA contains diffuse narrowing from proximal to mid segment 50-70%.  FFR of the mid [name] lesion was 0.87. No interventional therapy was performed on this occasion.  Generally speaking, the coronary is a small in caliber representing diffuse atherosclerosis.  RECOMMENDATIONS:   Medical therapy. Perhaps further up titration of isosorbide therapy.  Risk factor modification.     Patient Profile     84 y.o. female with a PMH of CAD s/p [name] PCI in 2018, remote h/o PAF not previously on anticoagulation, HLD, GERD and recent hospitalization for partial SBO (discharged yesterday), who has been readmitted for CP and dyspnea, in the setting of atrial fibrillation w/ RVR. She is being followed by cardiology for the evaluation of atrial fibrillation, CP and elevated troponin  Assessment & Plan    1. Chest pain in patient with CAD s/p PCI/DES to [name] in 2018: occurred in the setting of atrial fibrillation with RVR on arrival to the ED. Converted to NSR with metoprolol and no recurrent chest pain since that time. Trop elevated to 0.13 and trended down. She was recommended for a NST to further evaluate for ischemia. Observed to have some SOB, bilateral arm heaviness, and jaw tightness during NST for which she was given SL nitro with improvement in symptoms - no EKG changes occurred during stress test. She had not received her morning medications as of 12:30pm.  - Will follow-up NST results - if no ischemia do not ancipitate further  cardiac work-up.  - Continue aspirin, plavix, statin, metoprolol, and imdur  2. Atrial fibrillation with RVR: brief episodes with conversion to NSR with metoprolol in the ED. Anticoagulation not initiated until ischemic work-up completed.  - If no further  ischemic evaluation needed (I.e. LHC), then would initiate apixaban 2.5mg  BID (adjusted for age/weight) and would stop aspirin.  - Continue metoprolol for rate control    For questions or updates, please contact [place] Please consult [email or web address] for contact info under Cardiology/STEMI.      Signed, Abigail Butts, PA-C  06/03/2018, 1:05 PM   [phone]   Patient seen and evaluated today after stress test.  Unfortunately, her stress test was very poor quality.  On retrospect this is probably related to her recent bowel obstruction and slow transit through the gut.  There is significant bowel attenuation on this image study.  I did have Dr. Meda Coffee reviewed the images and she agrees that there is no reversibility and this is most likely consistent with artifact and not a large inferior infarct as there is no regional wall motion abnormality there.  As such, I think this is clearly a nonischemic stress test. She is probably okay for discharge.  She is on Plavix for CAD, but I agree with having her on Eliquis as well.  Stop aspirin.  I have increased her beta-blocker dose. Would probably recommend at least 3 more months of Plavix for this reason potential ACS presentation.  We discussed bleeding risks and other potential issues with Eliquis, and I think the given her increased risk of stroke Eliquis is the best option for her.  [facility] will sign off.   Medication Recommendations: Per discussion above Other recommendations (labs, testing, etc): None Follow up as an outpatient: Has appointment with Dr. Tamala Julian on January 10 .   Glenetta Hew, M.D., M.S. Interventional Cardiologist   Pager # [phone] Phone #  [phone] [address] [place], [place] [address]

## 2018-06-06 ENCOUNTER — Encounter: Payer: Self-pay | Admitting: Cardiology

## 2018-06-06 ENCOUNTER — Ambulatory Visit (INDEPENDENT_AMBULATORY_CARE_PROVIDER_SITE_OTHER): Payer: Medicare Other | Admitting: Cardiology

## 2018-06-06 VITALS — BP 120/60 | HR 63 | Ht 62.5 in | Wt 136.4 lb

## 2018-06-06 DIAGNOSIS — I2510 Atherosclerotic heart disease of native coronary artery without angina pectoris: Secondary | ICD-10-CM | POA: Diagnosis not present

## 2018-06-06 DIAGNOSIS — I480 Paroxysmal atrial fibrillation: Secondary | ICD-10-CM | POA: Diagnosis not present

## 2018-06-06 DIAGNOSIS — E876 Hypokalemia: Secondary | ICD-10-CM

## 2018-06-06 DIAGNOSIS — K56609 Unspecified intestinal obstruction, unspecified as to partial versus complete obstruction: Secondary | ICD-10-CM | POA: Diagnosis not present

## 2018-06-06 DIAGNOSIS — I208 Other forms of angina pectoris: Secondary | ICD-10-CM

## 2018-06-06 NOTE — Progress Notes (Signed)
Cardiology Office Note   Date:  06/06/2018   ID:  Sherry Barnett 1932-10-14, MRN [id number]  PCP:  Sherry Manes, MD  Cardiologist:  Dr. Tamala Barnett     Chief Complaint  Patient presents with  . Hospitalization Follow-up      History of Present Illness: Sherry Barnett is a 84 y.o. female who presents for post hospitalization 06/02/18 to 06/03/18 for chest pain and  Neg nuc.   She has a hx of CAD s/p [name] PC-DES -I in 2018, remote h/o PAF not previously on anticoagulation, HLD, GERD and recent hospitalization for partial SBO (discharged yesterday), who has been readmitted for CP and dyspnea, in the setting of atrial fibrillation w/ RVR.  She did convert in hospital.  Her pain resolved with resolution of a fib.  He did have a nuc study with no reversible ischemia.  Possible infarction in the inferoseptal wall.  Normal LV wall motion.    Her ASA was stopped and Plavix will continue for 3 months more.  And eliquis was added.  cha2DS2vasc score of 4.    Today she notes 2 brief episodes of a fib. She took deep breaths and they resolved.  She is not having any bleeding on her Eliquis and plavix.  No chest pain.  No SOB.  She is not eating much with her recent SBO, and BMs are not frequent.  Today after prune juice and walking she did have BM.    Past Medical History:  Diagnosis Date  . A-fib ([place])   . Anemia   . Arthritis    "a little in my hands" (08/13/2017)  . Blepharitis    chronic  . CAD (coronary artery disease), native coronary artery    10/18 PCI/DES to p/mLAD, normal EF  . Cardiovascular risk factor    23%  . Degenerative joint disease (DJD) of lumbar spine   . Diverticulosis   . Endometriosis   . Fibroids   . GERD (gastroesophageal reflux disease)   . Hematuria, microscopic [id number]  . Hemorrhoids   . Hypercholesterolemia    10 years  . Osteopenia 03/2016   start alendrodate  . PONV (postoperative nausea and vomiting)   . Renal disease    stage2/stage 3    Past  Surgical History:  Procedure Laterality Date  . APPENDECTOMY    . CARDIAC CATHETERIZATION  08/13/2017  . CATARACT EXTRACTION W/ INTRAOCULAR LENS  IMPLANT, BILATERAL Bilateral 2001 -  11/2016   "left-right"  . CORONARY ANGIOPLASTY WITH STENT PLACEMENT    . CORONARY STENT INTERVENTION N/A 03/14/2017   Procedure: CORONARY STENT INTERVENTION;  Surgeon: Belva Crome, MD;  Location: [facility];  Service: Cardiovascular;  Laterality: N/A;  . DILATION AND CURETTAGE OF UTERUS    . LEFT HEART CATH AND CORONARY ANGIOGRAPHY N/A 03/14/2017   Procedure: LEFT HEART CATH AND CORONARY ANGIOGRAPHY;  Surgeon: Belva Crome, MD;  Location: [facility];  Service: Cardiovascular;  Laterality: N/A;  . LEFT HEART CATH AND CORONARY ANGIOGRAPHY N/A 08/13/2017   Procedure: LEFT HEART CATH AND CORONARY ANGIOGRAPHY;  Surgeon: Belva Crome, MD;  Location: [facility];  Service: Cardiovascular;  Laterality: N/A;  . OVARIAN CYST SURGERY Bilateral   . SALPINGOOPHORECTOMY Bilateral    endometriosis; "still have a piece of my left ovary"  . TONSILLECTOMY       Current Outpatient Medications  Medication Sig Dispense Refill  . alendronate (FOSAMAX) 70 MG tablet Take 70 mg by  mouth every Saturday. Take with a full glass of water on an empty stomach.     Marland Kitchen apixaban (ELIQUIS) 5 MG TABS tablet Take 1 tablet (5 mg total) by mouth 2 (two) times daily. 60 tablet 1  . Artificial Tear Ointment (ARTIFICIAL TEARS) ointment Place 1 drop into both eyes 4 (four) times daily as needed (dry eyes). Refresh Tears for dry eyes.    Marland Kitchen atorvastatin (LIPITOR) 80 MG tablet TAKE 1 TABLET BY MOUTH EVERY DAY AT 6:00 PM 90 tablet 3  . Azelaic Acid (FINACEA) 15 % cream Apply 1 application topically 2 (two) times daily. After skin is thoroughly washed and patted dry, gently but thoroughly massage a thin film of azelaic acid cream into the affected area twice daily, in the morning and evening.     . calcium carbonate (TUMS EX) 750 MG  chewable tablet Chew 1-2 tablets by mouth 2 (two) times daily as needed (for hearburn/indigestion.).     Marland Kitchen cholecalciferol (VITAMIN D) 1000 units tablet Take 1,000 Units by mouth daily.    . clopidogrel (PLAVIX) 75 MG tablet TAKE 1 TABLET BY MOUTH DAILY 90 tablet 2  . famotidine (PEPCID) 20 MG tablet Take 1 tablet (20 mg total) by mouth 2 (two) times daily. 30 tablet 0  . isosorbide mononitrate (IMDUR) 60 MG 24 hr tablet Take 1 tablet (60 mg total) by mouth daily. 90 tablet 3  . metoprolol succinate (TOPROL-XL) 50 MG 24 hr tablet Take 1 tablet (50 mg total) by mouth daily. Take with or immediately following a meal. 90 tablet 3  . nitroGLYCERIN (NITROSTAT) 0.4 MG SL tablet Place 1 tablet (0.4 mg total) under the tongue every 5 (five) minutes as needed for chest pain. 25 tablet 3  . ondansetron (ZOFRAN-ODT) 8 MG disintegrating tablet Take 8 mg by mouth every 12 (twelve) hours as needed for nausea or vomiting.    . pantoprazole (PROTONIX) 40 MG tablet Take 1 tablet (40 mg total) by mouth daily as needed (acid reflux). 30 tablet 0  . sucralfate (CARAFATE) 1 g tablet Take 1 tablet (1 g total) by mouth 4 (four) times daily. 30 tablet 0  . tobramycin-dexamethasone (TOBRADEX) ophthalmic ointment Place 1 application into both eyes See admin instructions. Tuesday AND Saturday patient does not use. Will apply at night on all other nights.     No current facility-administered medications for this visit.     Allergies:   Sulfa antibiotics; Novocain [procaine]; and Penicillins    Social History:  The patient  reports that she quit smoking about 49 years ago. Her smoking use included cigarettes. She has a 0.50 pack-year smoking history. She has never used smokeless tobacco. She reports that she does not drink alcohol or use drugs.   Family History:  The patient's family history includes Alzheimer's disease in her father; Heart attack in her mother; Hypertension in her father; Other (age of onset: 62) in her  sister; Stroke in her father.    ROS:  General:no colds or fevers, no weight changes Skin:no rashes or ulcers HEENT:no blurred vision, no congestion CV:see HPI PUL:see HPI GI:no diarrhea constipation or melena, no indigestion, had BM today after prune juice and walking, is afraid to eat much.  GU:no hematuria, no dysuria MS:no joint pain, no claudication Neuro:no syncope, no lightheadedness Endo:no diabetes, no thyroid disease  Wt Readings from Last 3 Encounters:  06/06/18 136 lb 6.4 oz (61.9 kg)  06/03/18 135 lb 3.2 oz (61.3 kg)  05/28/18 142 lb (64.4 kg)  PHYSICAL EXAM: VS:  BP 120/60   Pulse 63   Ht 5' 2.5" (1.588 m)   Wt 136 lb 6.4 oz (61.9 kg)   SpO2 99%   BMI 24.55 kg/m  , BMI Body mass index is 24.55 kg/m. General:Pleasant affect, NAD Skin:Warm and dry, brisk capillary refill HEENT:normocephalic, sclera clear, mucus membranes moist Neck:supple, no JVD, no bruits  Heart:S1S2 RRR without murmur, gallup, rub or click Lungs:clear without rales, rhonchi, or wheezes XBJ:YNWG, non tender, + BS, do not palpate liver spleen or masses Ext:no lower ext edema, 2+ pedal pulses, 2+ radial pulses Neuro:alert and oriented X 3, MAE, follows commands, + facial symmetry    EKG:  EKG is NOT ordered today.    Recent Labs: 05/29/2018: Magnesium 2.3 06/02/2018: ALT 21 06/03/2018: BUN 8; Creatinine, Ser 0.88; Hemoglobin 12.1; Platelets 191; Potassium 3.8; Sodium 136    Lipid Panel    Component Value Date/Time   CHOL 117 05/06/2017 0806   TRIG 74 05/06/2017 0806   HDL 57 05/06/2017 0806   CHOLHDL 2.1 05/06/2017 0806   LDLCALC 45 05/06/2017 0806       Other studies Reviewed: Additional studies/ records that were reviewed today include:  Nuc study 06/03/18  IMPRESSION: 1. No reversible ischemia. Concern for infarction in the inferoseptal wall. Infarction versus breast attenuation anterior wall.  2. Normal left ventricular wall motion. Wall motion difficult  to assess due to motion.  3. Left ventricular ejection fraction 48%  4. Non invasive risk stratification*: Intermediate (risk stratification primarily weighted to low EF)   ASSESSMENT AND PLAN:  1.  PAF after having SBO - converted in the hospital but did have short bursts there.  At home she had 2 runs last pm.  We discussed a monitor for a fib burden but she does not wish to wear.  If she has a prolonged episode she should call the office for further instructions.  We will have her see Dr. Tamala Barnett back in 2 weeks.  She may need echo to be complete,  She is on Toprol XL 50 mg daily. Most likely would have her take extra half a pill depending on her symptoms.  By exam in SR.   2.   Chest pain - with neg nuc, pain resolved with resolution of a fib. Trop. Was 0.13   3.    CAD with hx of stent to mLAD 2018. On plavix, stopped ASA.  She is on Eliqis for PAF  4.   Partial SBO  Resolved, eating and having BMs. To see Dr. Felipa Eth in near future.    5.  Hypokalemia and hypomag.  Will recheck today.      Current medicines are reviewed with the patient today.  The patient Has no concerns regarding medicines.  The following changes have been made:  See above Labs/ tests ordered today include:see above  Disposition:   FU:  see above  Signed, Cecilie Kicks, NP  06/06/2018 3:56 PM    [facility] [place], [place], [place], [place] Phone: [phone]; Fax: [phone]

## 2018-06-06 NOTE — Patient Instructions (Signed)
Medication Instructions:  Your physician recommends that you continue on your current medications as directed. Please refer to the Current Medication list given to you today.  If you need a refill on your cardiac medications before your next appointment, please call your pharmacy.   Lab work: TODAY:  BMET & MAG  If you have labs (blood work) drawn today and your tests are completely normal, you will receive your results only by: Marland Kitchen MyChart Message (if you have MyChart) OR . A paper copy in the mail If you have any lab test that is abnormal or we need to change your treatment, we will call you to review the results.  Testing/Procedures: None ordered  Follow-Up: Your physician recommends that you schedule a follow-up appointment in: WITH DR. Tamala Julian.  WE WILL CALL YOU WITH THAT.   Any Other Special Instructions Will Be Listed Below (If Applicable).

## 2018-06-07 LAB — BASIC METABOLIC PANEL
BUN / CREAT RATIO: 14 (ref 12–28)
BUN: 10 mg/dL (ref 8–27)
CO2: 26 mmol/L (ref 20–29)
Calcium: 9.1 mg/dL (ref 8.7–10.3)
Chloride: 101 mmol/L (ref 96–106)
Creatinine, Ser: 0.74 mg/dL (ref 0.57–1.00)
GFR calc Af Amer: 87 mL/min/{1.73_m2} (ref >59)
GFR calc non Af Amer: 75 mL/min/{1.73_m2} (ref >59)
Glucose: 101 mg/dL — ABNORMAL HIGH (ref 65–99)
Potassium: 3.4 mmol/L — ABNORMAL LOW (ref 3.5–5.2)
Sodium: 141 mmol/L (ref 134–144)

## 2018-06-07 LAB — MAGNESIUM: Magnesium: 2 mg/dL (ref 1.6–2.3)

## 2018-06-09 ENCOUNTER — Telehealth: Payer: Self-pay | Admitting: *Deleted

## 2018-06-09 NOTE — Telephone Encounter (Signed)
Pt returned my call and she has been made aware of her lab results. Pt verbalized understanding.

## 2018-06-13 ENCOUNTER — Other Ambulatory Visit: Payer: Self-pay | Admitting: Interventional Cardiology

## 2018-06-17 DIAGNOSIS — Z79899 Other long term (current) drug therapy: Secondary | ICD-10-CM | POA: Diagnosis not present

## 2018-06-17 DIAGNOSIS — I480 Paroxysmal atrial fibrillation: Secondary | ICD-10-CM | POA: Diagnosis not present

## 2018-06-20 ENCOUNTER — Ambulatory Visit: Payer: Medicare Other | Admitting: Interventional Cardiology

## 2018-06-26 NOTE — Progress Notes (Signed)
Cardiology Office Note:    Date:  06/27/2018   ID:  Sherry Barnett, DOB 1932/12/06, MRN [id number]  PCP:  Lajean Manes, MD  Cardiologist:  Sinclair Grooms, MD   Referring MD: Lajean Manes, MD   No chief complaint on file.   History of Present Illness:    Sherry Barnett is a 85 y.o. female with a hx of exertional chest and jaw discomfortwhich upon evaluation with coronary angiography in October 2018 identified high-grade obstruction mid [name] treated with a drug-eluting stent, prior history of paroxysmal/episodic atrial fibrillation but no documenting data availableand on no anticoagulation therapy.  Small bowel obstruction May 28, 2018.  Admission for chest pain June 02, 2018.  She was in atrial fibrillation with RVR.  She has had a tough December.  She was initially admitted to the hospital with small bowel obstruction which resolved spontaneously.  Several days after discharge from the hospital she developed chest pain.  She was admitted with the chest pain as noted above on June 02, 2018.  Upon evaluation in ER she was noted to be in atrial fib with rapid ventricular response.  She has not been maintained on chronic anticoagulation therapy because atrial fibrillation has been very infrequent and of low burden.  She has spontaneous conversion during the hospital stay, had a myocardial perfusion study that was low risk, and was started on Eliquis because of a high Chads Vasc score of greater than 3.  She has tolerated anticoagulation and Plavix combination therapy without bleeding or complications.  Her last coronary intervention was October 2018.  This places her 15 months post stent implantation in the setting of chronic CAD  Past Medical History:  Diagnosis Date  . A-fib ([place])   . Anemia   . Arthritis    "a little in my hands" (08/13/2017)  . Blepharitis    chronic  . CAD (coronary artery disease), native coronary artery    10/18 PCI/DES to p/mLAD, normal EF  .  Cardiovascular risk factor    23%  . Degenerative joint disease (DJD) of lumbar spine   . Diverticulosis   . Endometriosis   . Fibroids   . GERD (gastroesophageal reflux disease)   . Hematuria, microscopic [id number]  . Hemorrhoids   . Hypercholesterolemia    10 years  . Osteopenia 03/2016   start alendrodate  . PONV (postoperative nausea and vomiting)   . Renal disease    stage2/stage 3    Past Surgical History:  Procedure Laterality Date  . APPENDECTOMY    . CARDIAC CATHETERIZATION  08/13/2017  . CATARACT EXTRACTION W/ INTRAOCULAR LENS  IMPLANT, BILATERAL Bilateral 2001 -  11/2016   "left-right"  . CORONARY ANGIOPLASTY WITH STENT PLACEMENT    . CORONARY STENT INTERVENTION N/A 03/14/2017   Procedure: CORONARY STENT INTERVENTION;  Surgeon: Belva Crome, MD;  Location: [facility];  Service: Cardiovascular;  Laterality: N/A;  . DILATION AND CURETTAGE OF UTERUS    . LEFT HEART CATH AND CORONARY ANGIOGRAPHY N/A 03/14/2017   Procedure: LEFT HEART CATH AND CORONARY ANGIOGRAPHY;  Surgeon: Belva Crome, MD;  Location: [facility];  Service: Cardiovascular;  Laterality: N/A;  . LEFT HEART CATH AND CORONARY ANGIOGRAPHY N/A 08/13/2017   Procedure: LEFT HEART CATH AND CORONARY ANGIOGRAPHY;  Surgeon: Belva Crome, MD;  Location: [facility];  Service: Cardiovascular;  Laterality: N/A;  . OVARIAN CYST SURGERY Bilateral   . SALPINGOOPHORECTOMY Bilateral    endometriosis; "still have  a piece of my left ovary"  . TONSILLECTOMY      Current Medications: Current Meds  Medication Sig  . alendronate (FOSAMAX) 70 MG tablet Take 70 mg by mouth every Saturday. Take with a full glass of water on an empty stomach.   Marland Kitchen apixaban (ELIQUIS) 5 MG TABS tablet Take 1 tablet (5 mg total) by mouth 2 (two) times daily.  . Artificial Tear Ointment (ARTIFICIAL TEARS) ointment Place 1 drop into both eyes 4 (four) times daily as needed (dry eyes). Refresh Tears for dry eyes.  Marland Kitchen atorvastatin  (LIPITOR) 80 MG tablet TAKE 1 TABLET BY MOUTH EVERY DAY AT 6 PM  . Azelaic Acid (FINACEA) 15 % cream Apply 1 application topically 2 (two) times daily. After skin is thoroughly washed and patted dry, gently but thoroughly massage a thin film of azelaic acid cream into the affected area twice daily, in the morning and evening.   . calcium carbonate (TUMS EX) 750 MG chewable tablet Chew 1-2 tablets by mouth 2 (two) times daily as needed (for hearburn/indigestion.).   Marland Kitchen cholecalciferol (VITAMIN D) 1000 units tablet Take 1,000 Units by mouth daily.  . famotidine (PEPCID) 20 MG tablet Take 1 tablet (20 mg total) by mouth 2 (two) times daily.  . isosorbide mononitrate (IMDUR) 60 MG 24 hr tablet Take 1 tablet (60 mg total) by mouth daily.  . metoprolol succinate (TOPROL-XL) 50 MG 24 hr tablet Take 1 tablet (50 mg total) by mouth daily. Take with or immediately following a meal.  . nitroGLYCERIN (NITROSTAT) 0.4 MG SL tablet Place 1 tablet (0.4 mg total) under the tongue every 5 (five) minutes as needed for chest pain.  Marland Kitchen tobramycin-dexamethasone (TOBRADEX) ophthalmic ointment Place 1 application into both eyes See admin instructions. Tuesday AND Saturday patient does not use. Will apply at night on all other nights.  . [DISCONTINUED] clopidogrel (PLAVIX) 75 MG tablet TAKE 1 TABLET BY MOUTH DAILY     Allergies:   Sulfa antibiotics; Novocain [procaine]; and Penicillins   Social History   Socioeconomic History  . Marital status: Married    Spouse name: Not on file  . Number of children: Not on file  . Years of education: Not on file  . Highest education level: Not on file  Occupational History  . Not on file  Social Needs  . Financial resource strain: Not on file  . Food insecurity:    Worry: Not on file    Inability: Not on file  . Transportation needs:    Medical: Not on file    Non-medical: Not on file  Tobacco Use  . Smoking status: Former Smoker    Packs/day: 0.10    Years: 5.00    Pack  years: 0.50    Types: Cigarettes    Last attempt to quit: 02/06/1969    Years since quitting: 49.4  . Smokeless tobacco: Never Used  Substance and Sexual Activity  . Alcohol use: No  . Drug use: No  . Sexual activity: Not on file    Comment: MARRIED  Lifestyle  . Physical activity:    Days per week: Not on file    Minutes per session: Not on file  . Stress: Not on file  Relationships  . Social connections:    Talks on phone: Not on file    Gets together: Not on file    Attends religious service: Not on file    Active member of club or organization: Not on file  Attends meetings of clubs or organizations: Not on file    Relationship status: Not on file  Other Topics Concern  . Not on file  Social History Narrative  . Not on file     Family History: The patient's family history includes Alzheimer's disease in her father; Heart attack in her mother; Hypertension in her father; Other (age of onset: 19) in her sister; Stroke in her father.  ROS:   Please see the history of present illness.    Decreased memory decreased appetite, diarrhea, occasional abdominal discomfort.  Recent small bowel obstruction with spontaneous resolution.  All other systems reviewed and are negative.  EKGs/Labs/Other Studies Reviewed:    The following studies were reviewed today: Myocardial perfusion imaging 06/03/2018: IMPRESSION: 1. No reversible ischemia. Concern for infarction in the inferoseptal wall. Infarction versus breast attenuation anterior wall.  2. Normal left ventricular wall motion. Wall motion difficult to assess due to motion.  3. Left ventricular ejection fraction 48%  4. Non invasive risk stratification*: Intermediate (risk stratification primarily weighted to low EF)  EKG:  EKG is not repeated today.  Last tracing performed on 06/03/2018 revealed atrial fibrillation with controlled ventricular response.  Recent Labs: 06/02/2018: ALT 21 06/03/2018: Hemoglobin 12.1;  Platelets 191 06/06/2018: BUN 10; Creatinine, Ser 0.74; Magnesium 2.0; Potassium 3.4; Sodium 141  Recent Lipid Panel    Component Value Date/Time   CHOL 117 05/06/2017 0806   TRIG 74 05/06/2017 0806   HDL 57 05/06/2017 0806   CHOLHDL 2.1 05/06/2017 0806   LDLCALC 45 05/06/2017 0806    Physical Exam:    VS:  BP (!) 106/54   Pulse 69   Ht 5' 2.5" (1.588 m)   Wt 132 lb 6.4 oz (60.1 kg)   SpO2 99%   BMI 23.83 kg/m     Wt Readings from Last 3 Encounters:  06/27/18 132 lb 6.4 oz (60.1 kg)  06/06/18 136 lb 6.4 oz (61.9 kg)  06/03/18 135 lb 3.2 oz (61.3 kg)     GEN: Appears healthy. No acute distress HEENT: Normal NECK: No JVD. LYMPHATICS: No lymphadenopathy CARDIAC: RRR.  No murmur, S4 gallop is present, no edema VASCULAR: 2+ carotid and radial pulses, absent bruits RESPIRATORY:  Clear to auscultation without rales, wheezing or rhonchi  ABDOMEN: Soft, non-tender, non-distended, No pulsatile mass, MUSCULOSKELETAL: No deformity  SKIN: Warm and dry NEUROLOGIC:  Alert and oriented x 3 PSYCHIATRIC:  Normal affect   ASSESSMENT:    1. Angina pectoris ([place])   2. Hyperlipidemia LDL goal <70   3. Paroxysmal atrial fibrillation with RVR ([place])   4. Syncope, unspecified syncope type   5. Coronary artery disease involving native coronary artery of native heart with angina pectoris ([place])   6. Chronic anticoagulation    PLAN:    In order of problems listed above:  1. She is now on apixaban because of atrial fib and had Chads Vasc score.  She is beyond 12 months post [name] stent and chronic coronary disease setting.  We will discontinue Plavix 2. LDL target less than 70 3. Agree with apixaban 5 mg twice daily.  She needs a 4 to 26-month follow-up at which time a hemoglobin and creatinine will need to be done.  As mentioned above Plavix has been discontinued since she is greater than 12 months out from stent implantation and chronic coronary disease setting.   4. No recurrence 5. Angina  when in atrial fibrillation.  Recent normal perfusion on nuclear stress test 06/03/2018.  This  is reassuring in terms of discontinuing antiplatelet therapy. 6. Plavix and Eliquis places her at higher risk of bleeding especially given recent small bowel obstruction.  Plavix has been discontinued on today's visit.  4 to 68-month follow-up with team member.  Reassess hemoglobin and creatinine at that time to determine if Eliquis dose is still appropriate.  Anticoagulation should be continuous and long-term.  Repeat EKG on return visit.   Medication Adjustments/Labs and Tests Ordered: Current medicines are reviewed at length with the patient today.  Concerns regarding medicines are outlined above.  No orders of the defined types were placed in this encounter.  No orders of the defined types were placed in this encounter.   Patient Instructions  Medication Instructions:  1) DISCONTINUE Plavix  If you need a refill on your cardiac medications before your next appointment, please call your pharmacy.   Lab work: None   If you have labs (blood work) drawn today and your tests are completely normal, you will receive your results only by: Marland Kitchen MyChart Message (if you have MyChart) OR . A paper copy in the mail If you have any lab test that is abnormal or we need to change your treatment, we will call you to review the results.  Testing/Procedures: None  Follow-Up: Your physician recommends that you schedule a follow-up appointment in: 4-6 months with a PA or NP.    Any Other Special Instructions Will Be Listed Below (If Applicable).       Signed, Sinclair Grooms, MD  06/27/2018 9:28 AM    [place]

## 2018-06-27 ENCOUNTER — Encounter: Payer: Self-pay | Admitting: Interventional Cardiology

## 2018-06-27 ENCOUNTER — Ambulatory Visit (INDEPENDENT_AMBULATORY_CARE_PROVIDER_SITE_OTHER): Payer: Medicare Other | Admitting: Interventional Cardiology

## 2018-06-27 VITALS — BP 106/54 | HR 69 | Ht 62.5 in | Wt 132.4 lb

## 2018-06-27 DIAGNOSIS — E785 Hyperlipidemia, unspecified: Secondary | ICD-10-CM | POA: Diagnosis not present

## 2018-06-27 DIAGNOSIS — I25119 Atherosclerotic heart disease of native coronary artery with unspecified angina pectoris: Secondary | ICD-10-CM | POA: Diagnosis not present

## 2018-06-27 DIAGNOSIS — Z7901 Long term (current) use of anticoagulants: Secondary | ICD-10-CM | POA: Diagnosis not present

## 2018-06-27 DIAGNOSIS — R55 Syncope and collapse: Secondary | ICD-10-CM | POA: Diagnosis not present

## 2018-06-27 DIAGNOSIS — I480 Paroxysmal atrial fibrillation: Secondary | ICD-10-CM | POA: Diagnosis not present

## 2018-06-27 DIAGNOSIS — I209 Angina pectoris, unspecified: Secondary | ICD-10-CM

## 2018-06-27 NOTE — Patient Instructions (Addendum)
Medication Instructions:  1) DISCONTINUE Plavix  If you need a refill on your cardiac medications before your next appointment, please call your pharmacy.   Lab work: None   If you have labs (blood work) drawn today and your tests are completely normal, you will receive your results only by: Marland Kitchen MyChart Message (if you have MyChart) OR . A paper copy in the mail If you have any lab test that is abnormal or we need to change your treatment, we will call you to review the results.  Testing/Procedures: None  Follow-Up: Your physician recommends that you schedule a follow-up appointment in: 4-6 months with a PA or NP.    Any Other Special Instructions Will Be Listed Below (If Applicable).

## 2018-07-05 ENCOUNTER — Other Ambulatory Visit: Payer: Self-pay | Admitting: Interventional Cardiology

## 2018-07-28 DIAGNOSIS — H02054 Trichiasis without entropian left upper eyelid: Secondary | ICD-10-CM | POA: Diagnosis not present

## 2018-07-28 DIAGNOSIS — H02051 Trichiasis without entropian right upper eyelid: Secondary | ICD-10-CM | POA: Diagnosis not present

## 2018-07-28 DIAGNOSIS — H0100A Unspecified blepharitis right eye, upper and lower eyelids: Secondary | ICD-10-CM | POA: Diagnosis not present

## 2018-07-28 DIAGNOSIS — H04123 Dry eye syndrome of bilateral lacrimal glands: Secondary | ICD-10-CM | POA: Diagnosis not present

## 2018-08-18 DIAGNOSIS — I209 Angina pectoris, unspecified: Secondary | ICD-10-CM | POA: Diagnosis not present

## 2018-08-18 DIAGNOSIS — I480 Paroxysmal atrial fibrillation: Secondary | ICD-10-CM | POA: Diagnosis not present

## 2018-08-18 DIAGNOSIS — J309 Allergic rhinitis, unspecified: Secondary | ICD-10-CM | POA: Diagnosis not present

## 2018-10-01 DIAGNOSIS — L820 Inflamed seborrheic keratosis: Secondary | ICD-10-CM | POA: Diagnosis not present

## 2018-10-01 DIAGNOSIS — L718 Other rosacea: Secondary | ICD-10-CM | POA: Diagnosis not present

## 2018-10-03 ENCOUNTER — Other Ambulatory Visit: Payer: Self-pay | Admitting: Interventional Cardiology

## 2018-10-28 ENCOUNTER — Ambulatory Visit: Payer: Medicare Other | Admitting: Nurse Practitioner

## 2018-11-04 ENCOUNTER — Telehealth: Payer: Self-pay | Admitting: *Deleted

## 2018-11-04 NOTE — Telephone Encounter (Signed)

## 2018-11-04 NOTE — Progress Notes (Signed)
Telehealth Visit     Virtual Visit via Video Note   This visit type was conducted due to national recommendations for restrictions regarding the COVID-19 Pandemic (e.g. social distancing) in an effort to limit this patient's exposure and mitigate transmission in our community.  Due to her co-morbid illnesses, this patient is at least at moderate risk for complications without adequate follow up.  This format is felt to be most appropriate for this patient at this time.  All issues noted in this document were discussed and addressed.  A limited physical exam was performed with this format.  Please refer to the patient's chart for her consent to telehealth for [facility].   Evaluation Performed:  Follow-up visit  This visit type was conducted due to national recommendations for restrictions regarding the COVID-19 Pandemic (e.g. social distancing).  This format is felt to be most appropriate for this patient at this time.  All issues noted in this document were discussed and addressed.  No physical exam was performed (except for noted visual exam findings with Video Visits).  Please refer to the patient's chart (MyChart message for video visits and phone note for telephone visits) for the patient's consent to telehealth for [facility].  Date:  11/05/2018   ID:  Sherry, Barnett [birth year], MRN [id number]  Patient Location:  Home  Provider location:   Home  PCP:  Lajean Manes, MD  Cardiologist:  Velvet Bathe, MD  Electrophysiologist:  None   Chief Complaint:  Follow up.   History of Present Illness:    Sherry Barnett is a 85 y.o. female who presents via audio/video conferencing for a telehealth visit today.  Seen for Dr. Tamala Julian.   She has a history of exertional chest and jaw discomfortwith coronary angiography in October 2018 identifying a high-grade obstruction mid [name] stenosis which was treated with a drug-eluting stent - placed on Plavix at that time. Other  issues include prior PAF but no documenting data availableand on no anticoagulation therapy. She had a small bowel obstruction in mid December of 2019 followed by an admission for chest pain due to AF with RVR. Now on chronic anticoagulation with Eliquis (along with Plavix) for CHADSVASC of greater than 3 - (previously not due to low AF burden). Myoview study was low risk. Last cath was in March of 2019 - to manage medically.   Last seen by Dr. Tamala Julian back in January of 2020 - her Plavix was stopped at that time. Eliquis was continued.   The patient does have symptoms concerning for COVID-19 infection (fever, chills, cough, or new shortness of breath).   Seen today by a telephone visit. She has consented for this visit. She feels like she is doing ok. Very talkative. She feels like her Eliquis has really helped her AF. Needs NTG refilled. She will have "a little discomfort" while she was cleaning up her yard from the recent storms - this is resolved with rest and she feels like she just worked too hard - it has not recurred and was not having prior.  She does have some issue with phlegm "gathering up" - she talked with Dr. Felipa Eth about this and is using nose spray prn but it does worry her somewhat. No palpitations. No bleeding but does have some bruising. Not dizzy or lightheaded but a few weeks ago, she woke up and felt really dizzy - lasted a few hours - then just resolved - did not interfere with any  of her activities - this was just "a one time thing" and has not recurred. No syncope. She is more worried about the "phlegm" than anything else.   Past Medical History:  Diagnosis Date  . A-fib ([place])   . Anemia   . Arthritis    "a little in my hands" (08/13/2017)  . Blepharitis    chronic  . CAD (coronary artery disease), native coronary artery    10/18 PCI/DES to p/mLAD, normal EF  . Cardiovascular risk factor    23%  . Degenerative joint disease (DJD) of lumbar spine   . Diverticulosis    . Endometriosis   . Fibroids   . GERD (gastroesophageal reflux disease)   . Hematuria, microscopic [id number]  . Hemorrhoids   . Hypercholesterolemia    10 years  . Osteopenia 03/2016   start alendrodate  . PONV (postoperative nausea and vomiting)   . Renal disease    stage2/stage 3   Past Surgical History:  Procedure Laterality Date  . APPENDECTOMY    . CARDIAC CATHETERIZATION  08/13/2017  . CATARACT EXTRACTION W/ INTRAOCULAR LENS  IMPLANT, BILATERAL Bilateral 2001 -  11/2016   "left-right"  . CORONARY ANGIOPLASTY WITH STENT PLACEMENT    . CORONARY STENT INTERVENTION N/A 03/14/2017   Procedure: CORONARY STENT INTERVENTION;  Surgeon: Belva Crome, MD;  Location: [place] [facility];  Service: Cardiovascular;  Laterality: N/A;  . DILATION AND CURETTAGE OF UTERUS    . LEFT HEART CATH AND CORONARY ANGIOGRAPHY N/A 03/14/2017   Procedure: LEFT HEART CATH AND CORONARY ANGIOGRAPHY;  Surgeon: Belva Crome, MD;  Location: [place] [facility];  Service: Cardiovascular;  Laterality: N/A;  . LEFT HEART CATH AND CORONARY ANGIOGRAPHY N/A 08/13/2017   Procedure: LEFT HEART CATH AND CORONARY ANGIOGRAPHY;  Surgeon: Belva Crome, MD;  Location: [place] [facility];  Service: Cardiovascular;  Laterality: N/A;  . OVARIAN CYST SURGERY Bilateral   . SALPINGOOPHORECTOMY Bilateral    endometriosis; "still have a piece of my left ovary"  . TONSILLECTOMY       Current Meds  Medication Sig  . alendronate (FOSAMAX) 70 MG tablet Take 70 mg by mouth every Saturday. Take with a full glass of water on an empty stomach.   Marland Kitchen apixaban (ELIQUIS) 5 MG TABS tablet Take 1 tablet (5 mg total) by mouth 2 (two) times daily.  . Artificial Tear Ointment (ARTIFICIAL TEARS) ointment Place 1 drop into both eyes 4 (four) times daily as needed (dry eyes). Refresh Tears for dry eyes.  Marland Kitchen atorvastatin (LIPITOR) 80 MG tablet TAKE 1 TABLET BY MOUTH EVERY DAY AT 6 PM  . Azelaic Acid (FINACEA) 15 % cream Apply 1 application topically 2  (two) times daily. After skin is thoroughly washed and patted dry, gently but thoroughly massage a thin film of azelaic acid cream into the affected area twice daily, in the morning and evening.   . calcium carbonate (TUMS EX) 750 MG chewable tablet Chew 1-2 tablets by mouth 2 (two) times daily as needed (for hearburn/indigestion.).   Marland Kitchen cholecalciferol (VITAMIN D) 1000 units tablet Take 1,000 Units by mouth daily.  . isosorbide mononitrate (IMDUR) 60 MG 24 hr tablet TAKE 1 TABLET BY MOUTH DAILY  . metoprolol succinate (TOPROL-XL) 50 MG 24 hr tablet TAKE 1 TABLET BY MOUTH DAILY WITH OR IMMEDIATELY FOLLOWING A MEAL  . metroNIDAZOLE (METROCREAM) 0.75 % cream Apply 1 application topically 2 (two) times daily. roseca  . nitroGLYCERIN (NITROSTAT) 0.4 MG SL tablet Place 1  tablet (0.4 mg total) under the tongue every 5 (five) minutes as needed for chest pain.  Marland Kitchen tobramycin-dexamethasone (TOBRADEX) ophthalmic ointment Place 1 application into both eyes See admin instructions. Tuesday AND Saturday patient does not use. Will apply at night on all other nights.  . [DISCONTINUED] nitroGLYCERIN (NITROSTAT) 0.4 MG SL tablet Place 1 tablet (0.4 mg total) under the tongue every 5 (five) minutes as needed for chest pain.     Allergies:   Sulfa antibiotics; Novocain [procaine]; and Penicillins   Social History   Tobacco Use  . Smoking status: Former Smoker    Packs/day: 0.10    Years: 5.00    Pack years: 0.50    Types: Cigarettes    Last attempt to quit: 02/06/1969    Years since quitting: 49.7  . Smokeless tobacco: Never Used  Substance Use Topics  . Alcohol use: No  . Drug use: No     Family Hx: The patient's family history includes Alzheimer's disease in her father; Heart attack in her mother; Hypertension in her father; Other (age of onset: 73) in her sister; Stroke in her father.  ROS:   Please see the history of present illness.   All other systems reviewed are negative.    Objective:     Vital Signs:  BP (!) 120/47 Comment: 111 51  Pulse (!) 51 Comment: 53  Ht 5' 2.5" (1.588 m)   Wt 135 lb 12.8 oz (61.6 kg)   BMI 24.44 kg/m    Wt Readings from Last 3 Encounters:  11/05/18 135 lb 12.8 oz (61.6 kg)  06/27/18 132 lb 6.4 oz (60.1 kg)  06/06/18 136 lb 6.4 oz (61.9 kg)    Alert female in no acute distress. Not short of breath with exertion.    Labs/Other Tests and Data Reviewed:    Lab Results  Component Value Date   WBC 7.7 06/03/2018   HGB 12.1 06/03/2018   HCT 36.2 06/03/2018   PLT 191 06/03/2018   GLUCOSE 101 (H) 06/06/2018   CHOL 117 05/06/2017   TRIG 74 05/06/2017   HDL 57 05/06/2017   LDLCALC 45 05/06/2017   ALT 21 06/02/2018   AST 29 06/02/2018   NA 141 06/06/2018   K 3.4 (L) 06/06/2018   CL 101 06/06/2018   CREATININE 0.74 06/06/2018   BUN 10 06/06/2018   CO2 26 06/06/2018   INR 1.0 08/02/2017     BNP (last 3 results) No results for input(s): BNP in the last 8760 hours.  ProBNP (last 3 results) No results for input(s): PROBNP in the last 8760 hours.    Prior CV studies:    The following studies were reviewed today:  Myocardial perfusion imaging 06/03/2018: IMPRESSION: 1. No reversible ischemia. Concern for infarction in the inferoseptal wall. Infarction versus breast attenuation anterior wall.  2. Normal left ventricular wall motion. Wall motion difficult to assess due to motion.  3. Left ventricular ejection fraction 48%  4. Non invasive risk stratification*: Intermediate (risk stratification primarily weighted to low EF)   LEFT HEART CATH AND CORONARY ANGIOGRAPHY 08/2017  Conclusion     Ost 3rd Mrg lesion is 85% stenosed.    Left main without evidence of significant obstruction  [name] contains mid and distal vessel significant tortuosity before wrapping around the left ventricular apex.  The previously placed proximal to mid [name] stent is widely patent.  The first dominant diagonal contains eccentric ostial to  proximal 60-70% narrowing.  The [name] beyond the diagonal is calcified  and contains eccentric 50-70% narrowing.  Circumflex contains proximal and mid luminal irregularities.  Distally before the origin of the dominant obtuse marginal branch there is 50% narrowing.  The continuation of the circumflex beyond the second obtuse marginal contains ostial 90% narrowing.  This is unchanged from the prior angiogram.  The right coronary contains 30-40% ostial narrowing.  No catheter dampening is noted.  The vessel contains tortuosity.  The mid vessel after the acute marginal branch contains 50-70% narrowing.  This is unchanged from prior.  The PDA contains diffuse narrowing from proximal to mid segment 50-70%.  FFR of the mid [name] lesion was 0.87.  No interventional therapy was performed on this occasion.  Generally speaking, the coronary is a small in caliber representing diffuse atherosclerosis.  RECOMMENDATIONS:   Medical therapy.  Perhaps further up titration of isosorbide therapy.  Risk factor modification.     ASSESSMENT & PLAN:    1.  CAD - remote PCI - has had one spell of probable angina - related to heavy exertion that she does not typically engage in - this has not recurred. She was able to exercise prior to COVID 19 without issue. She has NTG on hand. We will continue to follow.   2. PAF - has not recurred.   3. Chronic anticoagulation with Eliquis - needs surveillance labs on return. She is nearing the qualifications to have a dosage reduction. No current problems noted.   4. HLD - on statin - LDL was 34 by lipids from 02/2018 noted from [place]  5. Advanced age - overall, seems to be doing pretty well.   6. COVID-19 Education: The signs and symptoms of COVID-19 were discussed with the patient and how to seek care for testing (follow up with PCP or arrange E-visit).  The importance of social distancing, staying at home, hand hygiene and wearing a mask when out in public were discussed  today.  Patient Risk:   After full review of this patient's clinical status, I feel that they are at least moderate risk at this time.  Time:   Today, I have spent 13 minutes with the patient with telehealth technology discussing the above issues.     Medication Adjustments/Labs and Tests Ordered: Current medicines are reviewed at length with the patient today.  Concerns regarding medicines are outlined above.   Tests Ordered: No orders of the defined types were placed in this encounter.   Medication Changes: Meds ordered this encounter  Medications  . nitroGLYCERIN (NITROSTAT) 0.4 MG SL tablet    Sig: Place 1 tablet (0.4 mg total) under the tongue every 5 (five) minutes as needed for chest pain.    Dispense:  25 tablet    Refill:  3    Order Specific Question:   Supervising Provider    Answer:   [place], PETER M [[id number]]    Disposition:  FU with me in about 3 months = we will need to check lab on return.   Patient is agreeable to this plan and will call if any problems develop in the interim.   Amie Critchley, NP  11/05/2018 1:18 PM    [facility]

## 2018-11-05 ENCOUNTER — Telehealth (INDEPENDENT_AMBULATORY_CARE_PROVIDER_SITE_OTHER): Payer: Medicare Other | Admitting: Nurse Practitioner

## 2018-11-05 ENCOUNTER — Encounter: Payer: Self-pay | Admitting: Nurse Practitioner

## 2018-11-05 ENCOUNTER — Other Ambulatory Visit: Payer: Self-pay

## 2018-11-05 VITALS — BP 120/47 | HR 51 | Ht 62.5 in | Wt 135.8 lb

## 2018-11-05 DIAGNOSIS — Z7189 Other specified counseling: Secondary | ICD-10-CM

## 2018-11-05 DIAGNOSIS — I25119 Atherosclerotic heart disease of native coronary artery with unspecified angina pectoris: Secondary | ICD-10-CM

## 2018-11-05 DIAGNOSIS — E785 Hyperlipidemia, unspecified: Secondary | ICD-10-CM

## 2018-11-05 DIAGNOSIS — I480 Paroxysmal atrial fibrillation: Secondary | ICD-10-CM

## 2018-11-05 DIAGNOSIS — Z79899 Other long term (current) drug therapy: Secondary | ICD-10-CM

## 2018-11-05 MED ORDER — NITROGLYCERIN 0.4 MG SL SUBL: 0.4000 mg | SUBLINGUAL_TABLET | SUBLINGUAL | 3 refills | Status: DC | PRN

## 2018-11-05 NOTE — Patient Instructions (Addendum)
After Visit Summary:  We will be checking the following labs today - NONE   Medication Instructions:    Continue with your current medicines.   I have refilled the NTG today   If you need a refill on your cardiac medications before your next appointment, please call your pharmacy.     Testing/Procedures To Be Arranged:  N/A  Follow-Up:   See me in 3 months in the office with labs - Monday (mid) mornings are best    At [facility], you and your health needs are our priority.  As part of our continuing mission to provide you with exceptional heart care, we have created designated Provider Care Teams.  These Care Teams include your primary Cardiologist (physician) and Advanced Practice Providers (APPs -  Physician Assistants and Nurse Practitioners) who all work together to provide you with the care you need, when you need it.  Special Instructions:  . Stay safe, stay home, wash your hands for at least 20 seconds and wear a mask when out in public.  . It was good to talk with you today.    Call the [place] office at [phone] if you have any questions, problems or concerns.

## 2019-02-02 DIAGNOSIS — H52203 Unspecified astigmatism, bilateral: Secondary | ICD-10-CM | POA: Diagnosis not present

## 2019-02-02 DIAGNOSIS — H43813 Vitreous degeneration, bilateral: Secondary | ICD-10-CM | POA: Diagnosis not present

## 2019-02-02 DIAGNOSIS — Z961 Presence of intraocular lens: Secondary | ICD-10-CM | POA: Diagnosis not present

## 2019-02-02 DIAGNOSIS — H0100A Unspecified blepharitis right eye, upper and lower eyelids: Secondary | ICD-10-CM | POA: Diagnosis not present

## 2019-02-06 NOTE — Progress Notes (Signed)
CARDIOLOGY OFFICE NOTE  Date:  02/09/2019    Sherry Barnett Date of Birth: [birth year] Medical Record [id number]  PCP:  Lajean Manes, MD  Cardiologist:  Jennings Books  Chief Complaint  Patient presents with   Follow-up    Seen for Dr. Tamala Julian    History of Present Illness: Sherry Barnett is a 85 y.o. female who presents today for a 3 month check.  Seen for Dr. Tamala Julian.   She has a history of exertional chest and jaw discomfortwith coronary angiography in October 2018 identifying a high-grade obstruction mid [name] stenosis which was treated with a drug-eluting stent - placed on Plavix at that time. Other issues include prior PAF but no documenting data availableand on no anticoagulation therapy.She had a small bowel obstruction in mid December of 2019 followed by an admission for chest pain due to AF with RVR. Now on chronic anticoagulation with Eliquis (along with Plavix) for CHADSVASC of greater than 3 - (previously not due to low AF burden). Myoview study was low risk. Last cath was in March of 2019 - to manage medically.   Last seen by Dr. Tamala Julian back in January of 2020 - her Plavix was stopped at that time. Eliquis was continued.   I then saw her for a telehealth visit back in May - she was doing ok. Needed NTG refilled. She had had some discomfort after cleaning up her yard following a bout of storms - resolved with rest and had not recurred. She had also had a spell where she woke up dizzy for a few hours.   The patient does not have symptoms concerning for COVID-19 infection (fever, chills, cough, or new shortness of breath).   Comes in today. Here alone. Little AF in June and July - relaxed at the beach and it went away. None recent but typically has at night. No more dizzy spells like what she had told me on our telehealth visit back in May. No syncope. Doing "alright". She is walking at the [place] park since her gym is still closed down. She feels like she is still  taking her Plavix - was added back to her list today - did not remember Dr. Tamala Julian stopping it back in January (had already had 15 months of therapy at that time). She is also on Eliquis. No bleeding/bruising. No falls. BP typically a little higher but ok at home and her HR is usually in the high 50 to low 60's - she took her morning medicines just a few hours ago and this included Toprol.   Past Medical History:  Diagnosis Date   A-fib ([place])    Anemia    Arthritis    "a little in my hands" (08/13/2017)   Blepharitis    chronic   CAD (coronary artery disease), native coronary artery    10/18 PCI/DES to p/mLAD, normal EF   Cardiovascular risk factor    23%   Degenerative joint disease (DJD) of lumbar spine    Diverticulosis    Endometriosis    Fibroids    GERD (gastroesophageal reflux disease)    Hematuria, microscopic [id number]   Hemorrhoids    Hypercholesterolemia    10 years   Osteopenia 03/2016   start alendrodate   PONV (postoperative nausea and vomiting)    Renal disease    stage2/stage 3    Past Surgical History:  Procedure Laterality Date   APPENDECTOMY     CARDIAC CATHETERIZATION  08/13/2017  CATARACT EXTRACTION W/ INTRAOCULAR LENS  IMPLANT, BILATERAL Bilateral 2001 -  11/2016   "left-right"   CORONARY ANGIOPLASTY WITH STENT PLACEMENT     CORONARY STENT INTERVENTION N/A 03/14/2017   Procedure: CORONARY STENT INTERVENTION;  Surgeon: Belva Crome, MD;  Location: [facility];  Service: Cardiovascular;  Laterality: N/A;   DILATION AND CURETTAGE OF UTERUS     LEFT HEART CATH AND CORONARY ANGIOGRAPHY N/A 03/14/2017   Procedure: LEFT HEART CATH AND CORONARY ANGIOGRAPHY;  Surgeon: Belva Crome, MD;  Location: [facility];  Service: Cardiovascular;  Laterality: N/A;   LEFT HEART CATH AND CORONARY ANGIOGRAPHY N/A 08/13/2017   Procedure: LEFT HEART CATH AND CORONARY ANGIOGRAPHY;  Surgeon: Belva Crome, MD;  Location: [facility];   Service: Cardiovascular;  Laterality: N/A;   OVARIAN CYST SURGERY Bilateral    SALPINGOOPHORECTOMY Bilateral    endometriosis; "still have a piece of my left ovary"   TONSILLECTOMY       Medications: Current Meds  Medication Sig   alendronate (FOSAMAX) 70 MG tablet Take 70 mg by mouth every Saturday. Take with a full glass of water on an empty stomach.    apixaban (ELIQUIS) 5 MG TABS tablet Take 1 tablet (5 mg total) by mouth 2 (two) times daily.   Artificial Tear Ointment (ARTIFICIAL TEARS) ointment Place 1 drop into both eyes 4 (four) times daily as needed (dry eyes). Refresh Tears for dry eyes.   atorvastatin (LIPITOR) 80 MG tablet TAKE 1 TABLET BY MOUTH EVERY DAY AT 6 PM   calcium carbonate (TUMS EX) 750 MG chewable tablet Chew 1-2 tablets by mouth 2 (two) times daily as needed (for hearburn/indigestion.).    cholecalciferol (VITAMIN D) 1000 units tablet Take 1,000 Units by mouth daily.   isosorbide mononitrate (IMDUR) 60 MG 24 hr tablet TAKE 1 TABLET BY MOUTH DAILY   metoprolol succinate (TOPROL-XL) 50 MG 24 hr tablet TAKE 1 TABLET BY MOUTH DAILY WITH OR IMMEDIATELY FOLLOWING A MEAL   metroNIDAZOLE (METROCREAM) 0.75 % cream Apply 1 application topically 2 (two) times daily. roseca   tobramycin-dexamethasone (TOBRADEX) ophthalmic ointment Place 1 application into both eyes See admin instructions. Tuesday AND Saturday patient does not use. Will apply at night on all other nights.   [DISCONTINUED] clopidogrel (PLAVIX) 75 MG tablet Take 75 mg by mouth daily.     Allergies: Allergies  Allergen Reactions   Sulfa Antibiotics Other (See Comments)    HEMATURIA   Novocain [Procaine] Palpitations   Penicillins Rash    Has patient had a PCN reaction causing immediate rash, facial/tongue/throat swelling, SOB or lightheadedness with hypotension: Yes Has patient had a PCN reaction causing severe rash involving mucus membranes or skin necrosis: Unknown Has patient had a PCN  reaction that required hospitalization: No Has patient had a PCN reaction occurring within the last 10 years: No If all of the above answers are "NO", then may proceed with Cephalosporin use.     Social History: The patient  reports that she quit smoking about 50 years ago. Her smoking use included cigarettes. She has a 0.50 pack-year smoking history. She has never used smokeless tobacco. She reports that she does not drink alcohol or use drugs.   Family History: The patient's family history includes Alzheimer's disease in her father; Heart attack in her mother; Hypertension in her father; Other (age of onset: 62) in her sister; Stroke in her father.   Review of Systems: Please see the history of present  illness.   All other systems are reviewed and negative.   Physical Exam: VS:  BP 108/62    Pulse (!) 47    Ht 5' 2.5" (1.588 m)    Wt 137 lb 12.8 oz (62.5 kg)    SpO2 97%    BMI 24.80 kg/m  .  BMI Body mass index is 24.8 kg/m.  Wt Readings from Last 3 Encounters:  02/09/19 137 lb 12.8 oz (62.5 kg)  11/05/18 135 lb 12.8 oz (61.6 kg)  06/27/18 132 lb 6.4 oz (60.1 kg)    General: Pleasant. Elderly. Alert and in no acute distress.   HEENT: Normal.  Neck: Supple, no JVD, carotid bruits, or masses noted.  Cardiac: Regular rate and rhythm. HR is 56 by me. No murmurs, rubs, or gallops. No edema.  Respiratory:  Lungs are clear to auscultation bilaterally with normal work of breathing.  GI: Soft and nontender.  MS: No deformity or atrophy. Gait and ROM intact.  Skin: Warm and dry. Color is normal.  Neuro:  Strength and sensation are intact and no gross focal deficits noted.  Psych: Alert, appropriate and with normal affect.   LABORATORY DATA:  EKG:  EKG is not ordered today.  Lab Results  Component Value Date   WBC 7.7 06/03/2018   HGB 12.1 06/03/2018   HCT 36.2 06/03/2018   PLT 191 06/03/2018   GLUCOSE 101 (H) 06/06/2018   CHOL 117 05/06/2017   TRIG 74 05/06/2017   HDL 57  05/06/2017   LDLCALC 45 05/06/2017   ALT 21 06/02/2018   AST 29 06/02/2018   NA 141 06/06/2018   K 3.4 (L) 06/06/2018   CL 101 06/06/2018   CREATININE 0.74 06/06/2018   BUN 10 06/06/2018   CO2 26 06/06/2018   INR 1.0 08/02/2017       BNP (last 3 results) No results for input(s): BNP in the last 8760 hours.  ProBNP (last 3 results) No results for input(s): PROBNP in the last 8760 hours.   Other Studies Reviewed Today:  Myocardial perfusion imaging 06/03/2018: IMPRESSION: 1. No reversible ischemia. Concern for infarction in the inferoseptal wall. Infarction versus breast attenuation anterior wall.  2. Normal left ventricular wall motion. Wall motion difficult to assess due to motion.  3. Left ventricular ejection fraction 48%  4. Non invasive risk stratification*: Intermediate (risk stratification primarily weighted to low EF)   LEFT HEART CATH AND CORONARY ANGIOGRAPHY 08/2017  Conclusion     Ost 3rd Mrg lesion is 85% stenosed.   Left main without evidence of significant obstruction  [name] contains mid and distal vessel significant tortuosity before wrapping around the left ventricular apex. The previously placed proximal to mid [name] stent is widely patent. The first dominant diagonal contains eccentric ostial to proximal 60-70% narrowing. The [name] beyond the diagonal is calcified and contains eccentric 50-70% narrowing.  Circumflex contains proximal and mid luminal irregularities. Distally before the origin of the dominant obtuse marginal branch there is 50% narrowing. The continuation of the circumflex beyond the second obtuse marginal contains ostial 90% narrowing. This is unchanged from the prior angiogram.  The right coronary contains 30-40% ostial narrowing. No catheter dampening is noted. The vessel contains tortuosity. The mid vessel after the acute marginal branch contains 50-70% narrowing. This is unchanged from prior. The PDA contains  diffuse narrowing from proximal to mid segment 50-70%.  FFR of the mid [name] lesion was 0.87. No interventional therapy was performed on this occasion.  Generally speaking, the coronary  is a small in caliber representing diffuse atherosclerosis.  RECOMMENDATIONS:   Medical therapy. Perhaps further up titration of isosorbide therapy.  Risk factor modification.     ASSESSMENT & PLAN:    1.  CAD with remote PCI - stopping Plavix today per Dr. Thompson Caul recommendation from January visit. Doing well clinically. No active symptoms.   2. PAF - no recent episodes. Remains on Eliquis. HR is ok. She monitors at home.   3. Chronic anticoagulation - no problems noted - stopping Plavix today. Recheck surveillance lab today.   4. HLD - on statin - checking lab today.   5. COVID-19 Education: The signs and symptoms of COVID-19 were discussed with the patient and how to seek care for testing (follow up with PCP or arrange E-visit).  The importance of social distancing, staying at home, hand hygiene and wearing a mask when out in public were discussed today.  Current medicines are reviewed with the patient today.  The patient does not have concerns regarding medicines other than what has been noted above.  The following changes have been made:  See above.  Labs/ tests ordered today include:    Orders Placed This Encounter  Procedures   Basic metabolic panel   CBC   Hepatic function panel   Lipid panel     Disposition:   FU with Dr. Tamala Julian in about 4 months. I am happy to see back as needed.    Patient is agreeable to this plan and will call if any problems develop in the interim.   SignedTruitt Merle, NP  02/09/2019 10:27 AM  [facility] [address] [place], [place]  [address] Phone: [phone] Fax: [phone]

## 2019-02-09 ENCOUNTER — Ambulatory Visit: Payer: Medicare Other | Admitting: Nurse Practitioner

## 2019-02-09 ENCOUNTER — Other Ambulatory Visit: Payer: Self-pay

## 2019-02-09 ENCOUNTER — Encounter: Payer: Self-pay | Admitting: Nurse Practitioner

## 2019-02-09 ENCOUNTER — Ambulatory Visit (INDEPENDENT_AMBULATORY_CARE_PROVIDER_SITE_OTHER): Payer: Medicare Other | Admitting: Nurse Practitioner

## 2019-02-09 VITALS — BP 108/62 | HR 47 | Ht 62.5 in | Wt 137.8 lb

## 2019-02-09 DIAGNOSIS — I209 Angina pectoris, unspecified: Secondary | ICD-10-CM | POA: Diagnosis not present

## 2019-02-09 DIAGNOSIS — I480 Paroxysmal atrial fibrillation: Secondary | ICD-10-CM

## 2019-02-09 DIAGNOSIS — Z7189 Other specified counseling: Secondary | ICD-10-CM

## 2019-02-09 DIAGNOSIS — I25119 Atherosclerotic heart disease of native coronary artery with unspecified angina pectoris: Secondary | ICD-10-CM

## 2019-02-09 DIAGNOSIS — E785 Hyperlipidemia, unspecified: Secondary | ICD-10-CM | POA: Diagnosis not present

## 2019-02-09 DIAGNOSIS — Z79899 Other long term (current) drug therapy: Secondary | ICD-10-CM | POA: Diagnosis not present

## 2019-02-09 DIAGNOSIS — Z7901 Long term (current) use of anticoagulants: Secondary | ICD-10-CM | POA: Diagnosis not present

## 2019-02-09 LAB — BASIC METABOLIC PANEL
BUN/Creatinine Ratio: 14 (ref 12–28)
BUN: 13 mg/dL (ref 8–27)
CO2: 25 mmol/L (ref 20–29)
Calcium: 9.1 mg/dL (ref 8.7–10.3)
Chloride: 105 mmol/L (ref 96–106)
Creatinine, Ser: 0.93 mg/dL (ref 0.57–1.00)
GFR calc Af Amer: 65 mL/min/{1.73_m2} (ref >59)
GFR calc non Af Amer: 57 mL/min/{1.73_m2} — ABNORMAL LOW (ref >59)
Glucose: 80 mg/dL (ref 65–99)
Potassium: 4.6 mmol/L (ref 3.5–5.2)
Sodium: 140 mmol/L (ref 134–144)

## 2019-02-09 LAB — LIPID PANEL
Chol/HDL Ratio: 2.1 ratio (ref 0.0–4.4)
Cholesterol, Total: 116 mg/dL (ref 100–199)
HDL: 56 mg/dL (ref >39)
LDL Chol Calc (NIH): 46 mg/dL (ref 0–99)
Triglycerides: 64 mg/dL (ref 0–149)
VLDL Cholesterol Cal: 14 mg/dL (ref 5–40)

## 2019-02-09 LAB — CBC
Hematocrit: 36.9 % (ref 34.0–46.6)
Hemoglobin: 12.5 g/dL (ref 11.1–15.9)
MCH: 31.6 pg (ref 26.6–33.0)
MCHC: 33.9 g/dL (ref 31.5–35.7)
MCV: 93 fL (ref 79–97)
Platelets: 172 10*3/uL (ref 150–450)
RBC: 3.95 x10E6/uL (ref 3.77–5.28)
RDW: 12.8 % (ref 11.7–15.4)
WBC: 7 10*3/uL (ref 3.4–10.8)

## 2019-02-09 LAB — HEPATIC FUNCTION PANEL
ALT: 20 IU/L (ref 0–32)
AST: 23 IU/L (ref 0–40)
Albumin: 4 g/dL (ref 3.6–4.6)
Alkaline Phosphatase: 69 IU/L (ref 39–117)
Bilirubin Total: 0.5 mg/dL (ref 0.0–1.2)
Bilirubin, Direct: 0.16 mg/dL (ref 0.00–0.40)
Total Protein: 6.1 g/dL (ref 6.0–8.5)

## 2019-02-09 NOTE — Patient Instructions (Addendum)
After Visit Summary:  We will be checking the following labs today - BMET, lipids, LFTs and CBC   Medication Instructions:    Continue with your current medicines. BUT  We are stopping the Plavix today   If you need a refill on your cardiac medications before your next appointment, please call your pharmacy.     Testing/Procedures To Be Arranged:  N/A  Follow-Up:   See Dr. Tamala Julian in about 4 months    At [facility], you and your health needs are our priority.  As part of our continuing mission to provide you with exceptional heart care, we have created designated Provider Care Teams.  These Care Teams include your primary Cardiologist (physician) and Advanced Practice Providers (APPs -  Physician Assistants and Nurse Practitioners) who all work together to provide you with the care you need, when you need it.  Special Instructions:  . Stay safe, stay home, wash your hands for at least 20 seconds and wear a mask when out in public.  . It was good to talk with you today.    Call the [place] office at [phone] if you have any questions, problems or concerns.

## 2019-03-09 DIAGNOSIS — Z79899 Other long term (current) drug therapy: Secondary | ICD-10-CM | POA: Diagnosis not present

## 2019-03-09 DIAGNOSIS — Z0000 Encounter for general adult medical examination without abnormal findings: Secondary | ICD-10-CM | POA: Diagnosis not present

## 2019-03-09 DIAGNOSIS — E7800 Pure hypercholesterolemia, unspecified: Secondary | ICD-10-CM | POA: Diagnosis not present

## 2019-03-09 DIAGNOSIS — Z1389 Encounter for screening for other disorder: Secondary | ICD-10-CM | POA: Diagnosis not present

## 2019-03-09 DIAGNOSIS — D6869 Other thrombophilia: Secondary | ICD-10-CM | POA: Diagnosis not present

## 2019-03-09 DIAGNOSIS — I480 Paroxysmal atrial fibrillation: Secondary | ICD-10-CM | POA: Diagnosis not present

## 2019-03-09 DIAGNOSIS — I209 Angina pectoris, unspecified: Secondary | ICD-10-CM | POA: Diagnosis not present

## 2019-04-01 ENCOUNTER — Other Ambulatory Visit: Payer: Self-pay | Admitting: Interventional Cardiology

## 2019-04-01 MED ORDER — ATORVASTATIN CALCIUM 80 MG PO TABS: ORAL_TABLET | ORAL | 2 refills | Status: DC

## 2019-05-18 NOTE — Progress Notes (Signed)
Cardiology Office Note:    Date:  05/20/2019   ID:  Sherry, Barnett Jan 25, 1933, MRN [id number]  PCP:  Lajean Manes, MD  Cardiologist:  Sinclair Grooms, MD   Referring MD: Lajean Manes, MD   Chief Complaint  Patient presents with  . Coronary Artery Disease    History of Present Illness:    Sherry Barnett is a 85 y.o. female with a hx of CAD 2018 high-grade obstruction mid Barnett treated with a drug-eluting stent), paroxysmal/episodic atrial fibrillation, and hyperlipidemia.  She is ambulating without difficulty.  She has not had chest discomfort, claudication, lightheadedness, or syncope.  She denies nitroglycerin use.  There was some confusion about her Plavix still being taken.  Initially that she was still taking based upon her handwritten medication was.  After questioning, it was resolved but she is not taking the medication.  Past Medical History:  Diagnosis Date  . A-fib ([place])   . Anemia   . Arthritis    "a little in my hands" (08/13/2017)  . Blepharitis    chronic  . CAD (coronary artery disease), native coronary artery    10/18 PCI/DES to p/mLAD, normal EF  . Cardiovascular risk factor    23%  . Degenerative joint disease (DJD) of lumbar spine   . Diverticulosis   . Endometriosis   . Fibroids   . GERD (gastroesophageal reflux disease)   . Hematuria, microscopic [id number]  . Hemorrhoids   . Hypercholesterolemia    10 years  . Osteopenia 03/2016   start alendrodate  . PONV (postoperative nausea and vomiting)   . Renal disease    stage2/stage 3    Past Surgical History:  Procedure Laterality Date  . APPENDECTOMY    . CARDIAC CATHETERIZATION  08/13/2017  . CATARACT EXTRACTION W/ INTRAOCULAR LENS  IMPLANT, BILATERAL Bilateral 2001 -  11/2016   "left-right"  . CORONARY ANGIOPLASTY WITH STENT PLACEMENT    . CORONARY STENT INTERVENTION N/A 03/14/2017   Procedure: CORONARY STENT INTERVENTION;  Surgeon: Belva Crome, MD;  Location: [facility];  Service:  Cardiovascular;  Laterality: N/A;  . DILATION AND CURETTAGE OF UTERUS    . LEFT HEART CATH AND CORONARY ANGIOGRAPHY N/A 03/14/2017   Procedure: LEFT HEART CATH AND CORONARY ANGIOGRAPHY;  Surgeon: Belva Crome, MD;  Location: [facility];  Service: Cardiovascular;  Laterality: N/A;  . LEFT HEART CATH AND CORONARY ANGIOGRAPHY N/A 08/13/2017   Procedure: LEFT HEART CATH AND CORONARY ANGIOGRAPHY;  Surgeon: Belva Crome, MD;  Location: [facility];  Service: Cardiovascular;  Laterality: N/A;  . OVARIAN CYST SURGERY Bilateral   . SALPINGOOPHORECTOMY Bilateral    endometriosis; "still have a piece of my left ovary"  . TONSILLECTOMY      Current Medications: Current Meds  Medication Sig  . alendronate (FOSAMAX) 70 MG tablet Take 70 mg by mouth every Saturday. Take with a full glass of water on an empty stomach.   Marland Kitchen apixaban (ELIQUIS) 5 MG TABS tablet Take 1 tablet (5 mg total) by mouth 2 (two) times daily.  . Artificial Tear Ointment (ARTIFICIAL TEARS) ointment Place 1 drop into both eyes 4 (four) times daily as needed (dry eyes). Refresh Tears for dry eyes.  Marland Kitchen atorvastatin (LIPITOR) 80 MG tablet TAKE 1 TABLET BY MOUTH EVERY DAY AT 6 PM  . calcium carbonate (TUMS EX) 750 MG chewable tablet Chew 1-2 tablets by mouth 2 (two) times daily as needed (for hearburn/indigestion.).   Marland Kitchen  cholecalciferol (VITAMIN D) 1000 units tablet Take 1,000 Units by mouth daily.  . isosorbide mononitrate (IMDUR) 60 MG 24 hr tablet TAKE 1 TABLET BY MOUTH DAILY  . metoprolol succinate (TOPROL-XL) 50 MG 24 hr tablet TAKE 1 TABLET BY MOUTH DAILY WITH OR IMMEDIATELY FOLLOWING A MEAL  . metroNIDAZOLE (METROCREAM) 0.75 % cream Apply 1 application topically 2 (two) times daily. roseca  . tobramycin-dexamethasone (TOBRADEX) ophthalmic ointment Place 1 application into both eyes See admin instructions. Tuesday AND Saturday patient does not use. Will apply at night on all other nights.     Allergies:   Sulfa  antibiotics, Novocain [procaine], and Penicillins   Social History   Socioeconomic History  . Marital status: Married    Spouse name: Not on file  . Number of children: Not on file  . Years of education: Not on file  . Highest education level: Not on file  Occupational History  . Not on file  Social Needs  . Financial resource strain: Not on file  . Food insecurity    Worry: Not on file    Inability: Not on file  . Transportation needs    Medical: Not on file    Non-medical: Not on file  Tobacco Use  . Smoking status: Former Smoker    Packs/day: 0.10    Years: 5.00    Pack years: 0.50    Types: Cigarettes    Quit date: 02/06/1969    Years since quitting: 50.3  . Smokeless tobacco: Never Used  Substance and Sexual Activity  . Alcohol use: No  . Drug use: No  . Sexual activity: Not on file    Comment: MARRIED  Lifestyle  . Physical activity    Days per week: Not on file    Minutes per session: Not on file  . Stress: Not on file  Relationships  . Social Herbalist on phone: Not on file    Gets together: Not on file    Attends religious service: Not on file    Active member of club or organization: Not on file    Attends meetings of clubs or organizations: Not on file    Relationship status: Not on file  Other Topics Concern  . Not on file  Social History Narrative  . Not on file     Family History: The patient's family history includes Alzheimer's disease in her father; Heart attack in her mother; Hypertension in her father; Other (age of onset: 2) in her sister; Stroke in her father.  ROS:   Please see the history of present illness.    Continues to have some difficulty with memory.  Weight and appetite have been stable.  All other systems reviewed and are negative.  EKGs/Labs/Other Studies Reviewed:    The following studies were reviewed today: No recent imaging data  EKG:  EKG normal sinus rhythm.  Normal EKG.  When compared to December 2019,  atrial fibrillation has resolved.  Recent Labs: 06/06/2018: Magnesium 2.0 02/09/2019: ALT 20; BUN 13; Creatinine, Ser 0.93; Hemoglobin 12.5; Platelets 172; Potassium 4.6; Sodium 140  Recent Lipid Panel    Component Value Date/Time   CHOL 116 02/09/2019 1044   TRIG 64 02/09/2019 1044   HDL 56 02/09/2019 1044   CHOLHDL 2.1 02/09/2019 1044   LDLCALC 46 02/09/2019 1044    Physical Exam:    VS:  Ht 5' 2.5" (1.588 m)   Wt 137 lb 12.8 oz (62.5 kg)   BMI 24.80 kg/m  Wt Readings from Last 3 Encounters:  05/20/19 137 lb 12.8 oz (62.5 kg)  02/09/19 137 lb 12.8 oz (62.5 kg)  11/05/18 135 lb 12.8 oz (61.6 kg)     GEN: Younger than stated age. No acute distress HEENT: Normal NECK: No JVD. LYMPHATICS: No lymphadenopathy CARDIAC:  RRR without murmur, gallop, or edema. VASCULAR:  Normal Pulses. No bruits. RESPIRATORY:  Clear to auscultation without rales, wheezing or rhonchi  ABDOMEN: Soft, non-tender, non-distended, No pulsatile mass, MUSCULOSKELETAL: No deformity  SKIN: Warm and dry NEUROLOGIC:  Alert and oriented x 3 PSYCHIATRIC:  Normal affect   ASSESSMENT:    1. Coronary artery disease involving native coronary artery of native heart with angina pectoris (HCC)   2. Paroxysmal atrial fibrillation ([place])   3. Hyperlipidemia LDL goal <70   4. Chronic anticoagulation   5. Educated about COVID-19 virus infection    PLAN:    In order of problems listed above:  1. Secondary prevention is discussed.  I encouraged physical activity. 2. She denies palpitations. 3. LDL target less than 70. 4. No bleeding complications on apixaban.  Most recent creatinine and hemoglobin were stable. 5. 3W's is being practiced to avoid COVID-19 infection.  Overall education and awareness concerning primary/secondary risk prevention was discussed in detail: LDL less than 70, hemoglobin A1c less than 7, blood pressure target less than 130/80 mmHg, >150 minutes of moderate aerobic activity per week,  avoidance of smoking, weight control (via diet and exercise), and continued surveillance/management of/for obstructive sleep apnea.    Medication Adjustments/Labs and Tests Ordered: Current medicines are reviewed at length with the patient today.  Concerns regarding medicines are outlined above.  No orders of the defined types were placed in this encounter.  No orders of the defined types were placed in this encounter.   There are no Patient Instructions on file for this visit.   Signed, Sinclair Grooms, MD  05/20/2019 10:59 AM    [place]

## 2019-05-20 ENCOUNTER — Ambulatory Visit (INDEPENDENT_AMBULATORY_CARE_PROVIDER_SITE_OTHER): Payer: Medicare Other | Admitting: Interventional Cardiology

## 2019-05-20 ENCOUNTER — Encounter: Payer: Self-pay | Admitting: Interventional Cardiology

## 2019-05-20 ENCOUNTER — Other Ambulatory Visit: Payer: Self-pay

## 2019-05-20 VITALS — BP 96/58 | HR 64 | Ht 62.5 in | Wt 137.8 lb

## 2019-05-20 DIAGNOSIS — Z7901 Long term (current) use of anticoagulants: Secondary | ICD-10-CM

## 2019-05-20 DIAGNOSIS — I480 Paroxysmal atrial fibrillation: Secondary | ICD-10-CM | POA: Diagnosis not present

## 2019-05-20 DIAGNOSIS — E785 Hyperlipidemia, unspecified: Secondary | ICD-10-CM

## 2019-05-20 DIAGNOSIS — I25119 Atherosclerotic heart disease of native coronary artery with unspecified angina pectoris: Secondary | ICD-10-CM | POA: Diagnosis not present

## 2019-05-20 DIAGNOSIS — I209 Angina pectoris, unspecified: Secondary | ICD-10-CM

## 2019-05-20 DIAGNOSIS — Z7189 Other specified counseling: Secondary | ICD-10-CM

## 2019-05-20 MED ORDER — NITROGLYCERIN 0.4 MG SL SUBL: 0.4000 mg | SUBLINGUAL_TABLET | SUBLINGUAL | 3 refills | Status: DC | PRN

## 2019-05-20 NOTE — Patient Instructions (Signed)

## 2019-06-22 ENCOUNTER — Encounter: Payer: Self-pay | Admitting: Interventional Cardiology

## 2019-06-22 NOTE — Telephone Encounter (Signed)
error 

## 2019-06-29 ENCOUNTER — Other Ambulatory Visit: Payer: Self-pay | Admitting: Interventional Cardiology

## 2019-06-29 MED ORDER — ISOSORBIDE MONONITRATE ER 60 MG PO TB24: 60.0000 mg | ORAL_TABLET | Freq: Every day | ORAL | 3 refills | Status: DC

## 2019-07-01 ENCOUNTER — Ambulatory Visit: Payer: Medicare Other | Attending: Internal Medicine

## 2019-07-01 DIAGNOSIS — Z23 Encounter for immunization: Secondary | ICD-10-CM | POA: Insufficient documentation

## 2019-07-01 NOTE — Progress Notes (Signed)
   Covid-19 Vaccination Clinic  Name:  Sherry Barnett    MRN: [id number] DOB: 1933/04/12  07/01/2019  [name] was observed post Covid-19 immunization for 15 minutes without incidence. She was provided with Vaccine Information Sheet and instruction to access the V-Safe system.   [name] was instructed to call 911 with any severe reactions post vaccine: Marland Kitchen Difficulty breathing  . Swelling of your face and throat  . A fast heartbeat  . A bad rash all over your body  . Dizziness and weakness    Immunizations Administered    Name Date Dose VIS Date Route   Pfizer COVID-19 Vaccine 07/01/2019 11:09 AM 0.3 mL 05/22/2019 Intramuscular   Manufacturer: [place]   Lot: [id number]   [place]: [id number]

## 2019-07-20 ENCOUNTER — Ambulatory Visit: Payer: Medicare Other | Attending: Internal Medicine

## 2019-07-20 DIAGNOSIS — Z23 Encounter for immunization: Secondary | ICD-10-CM | POA: Insufficient documentation

## 2019-07-20 NOTE — Progress Notes (Signed)
   Covid-19 Vaccination Clinic  Name:  Sherry Barnett    MRN: [id number] DOB: 11-04-1932  07/20/2019  [name] was observed post Covid-19 immunization for 15 minutes without incidence. She was provided with Vaccine Information Sheet and instruction to access the V-Safe system.   [name] was instructed to call 911 with any severe reactions post vaccine: Marland Kitchen Difficulty breathing  . Swelling of your face and throat  . A fast heartbeat  . A bad rash all over your body  . Dizziness and weakness    Immunizations Administered    Name Date Dose VIS Date Route   Pfizer COVID-19 Vaccine 07/20/2019  5:04 PM 0.3 mL 05/22/2019 Intramuscular   Manufacturer: [place]   Lot: [id number]   [place]: [id number]

## 2019-07-27 ENCOUNTER — Other Ambulatory Visit: Payer: Self-pay | Admitting: Interventional Cardiology

## 2019-07-27 MED ORDER — METOPROLOL SUCCINATE ER 50 MG PO TB24: ORAL_TABLET | ORAL | 2 refills | Status: DC

## 2019-08-03 DIAGNOSIS — H0100A Unspecified blepharitis right eye, upper and lower eyelids: Secondary | ICD-10-CM | POA: Diagnosis not present

## 2019-08-03 DIAGNOSIS — H02403 Unspecified ptosis of bilateral eyelids: Secondary | ICD-10-CM | POA: Diagnosis not present

## 2019-08-03 DIAGNOSIS — H43813 Vitreous degeneration, bilateral: Secondary | ICD-10-CM | POA: Diagnosis not present

## 2019-08-03 DIAGNOSIS — Z961 Presence of intraocular lens: Secondary | ICD-10-CM | POA: Diagnosis not present

## 2019-09-07 DIAGNOSIS — J309 Allergic rhinitis, unspecified: Secondary | ICD-10-CM | POA: Diagnosis not present

## 2019-09-07 DIAGNOSIS — Z79899 Other long term (current) drug therapy: Secondary | ICD-10-CM | POA: Diagnosis not present

## 2019-09-07 DIAGNOSIS — I480 Paroxysmal atrial fibrillation: Secondary | ICD-10-CM | POA: Diagnosis not present

## 2019-09-07 DIAGNOSIS — D6869 Other thrombophilia: Secondary | ICD-10-CM | POA: Diagnosis not present

## 2020-01-11 DIAGNOSIS — H0100B Unspecified blepharitis left eye, upper and lower eyelids: Secondary | ICD-10-CM | POA: Diagnosis not present

## 2020-01-11 DIAGNOSIS — H0100A Unspecified blepharitis right eye, upper and lower eyelids: Secondary | ICD-10-CM | POA: Diagnosis not present

## 2020-01-11 DIAGNOSIS — H52203 Unspecified astigmatism, bilateral: Secondary | ICD-10-CM | POA: Diagnosis not present

## 2020-01-29 DIAGNOSIS — I480 Paroxysmal atrial fibrillation: Secondary | ICD-10-CM | POA: Diagnosis not present

## 2020-01-29 DIAGNOSIS — R600 Localized edema: Secondary | ICD-10-CM | POA: Diagnosis not present

## 2020-01-29 DIAGNOSIS — I872 Venous insufficiency (chronic) (peripheral): Secondary | ICD-10-CM | POA: Diagnosis not present

## 2020-01-29 DIAGNOSIS — Z79899 Other long term (current) drug therapy: Secondary | ICD-10-CM | POA: Diagnosis not present

## 2020-01-31 IMAGING — CR DG ABDOMEN ACUTE W/ 1V CHEST
3 series · 3 of 3 positions shown · non-contrast
Comparison: None.

CLINICAL DATA: Vomiting and abdominal pain.

EXAM:
DG ABDOMEN ACUTE W/ 1V CHEST

[w abdomen decub]
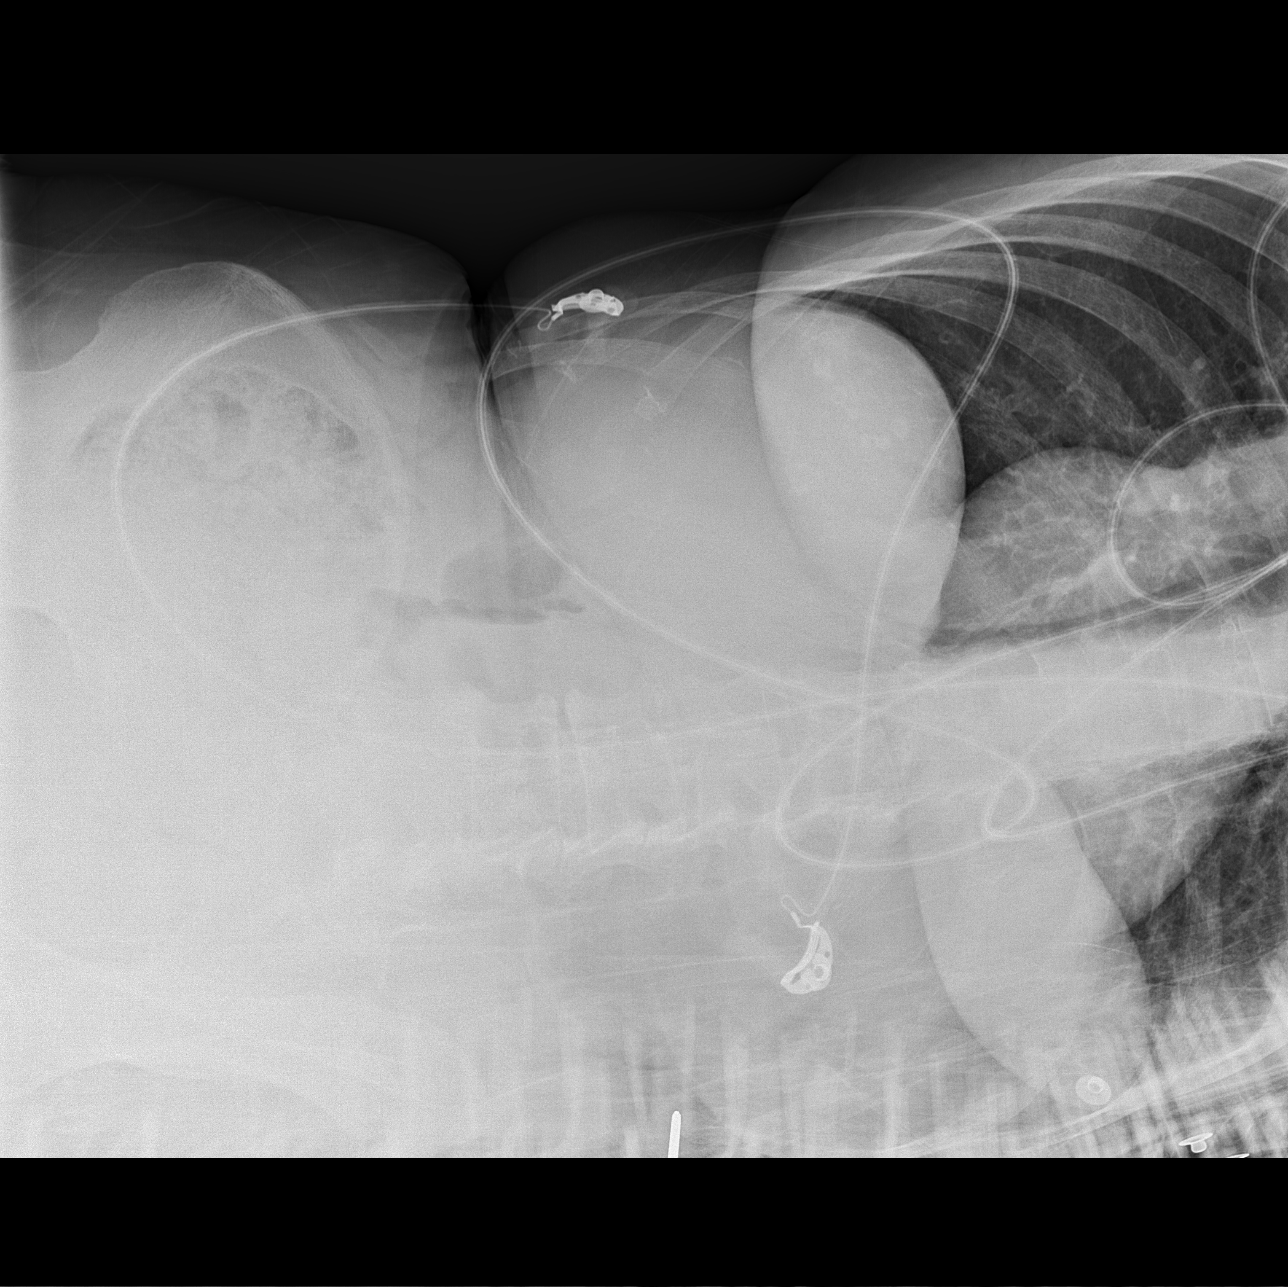

[x abdomen supine]
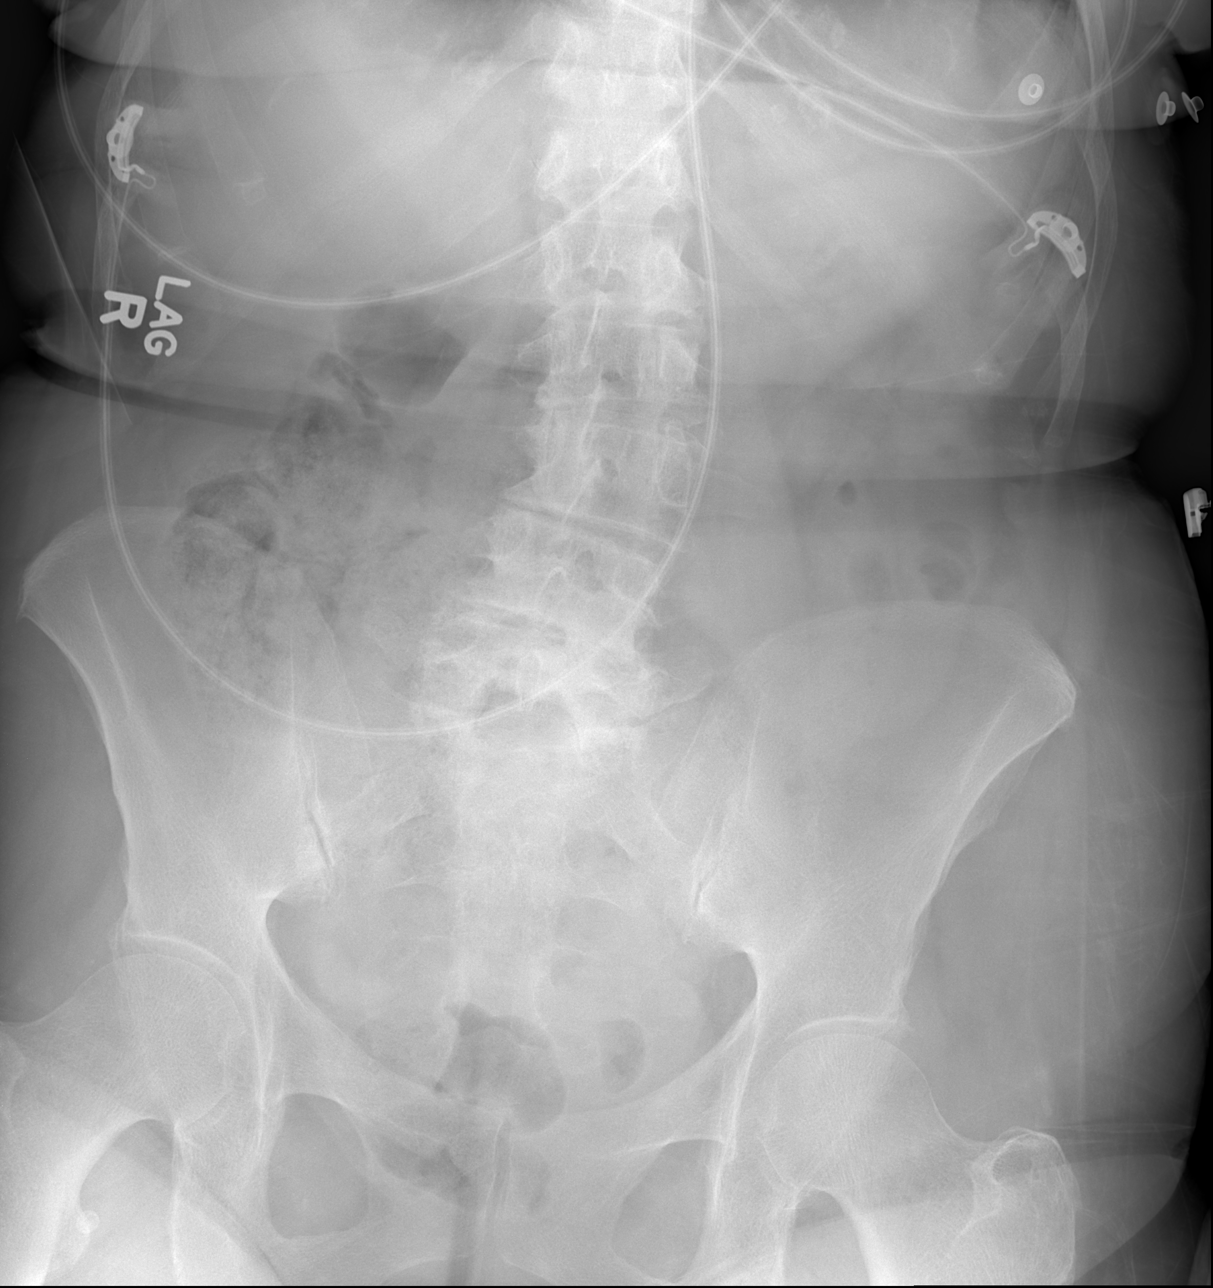

[x chest ap]
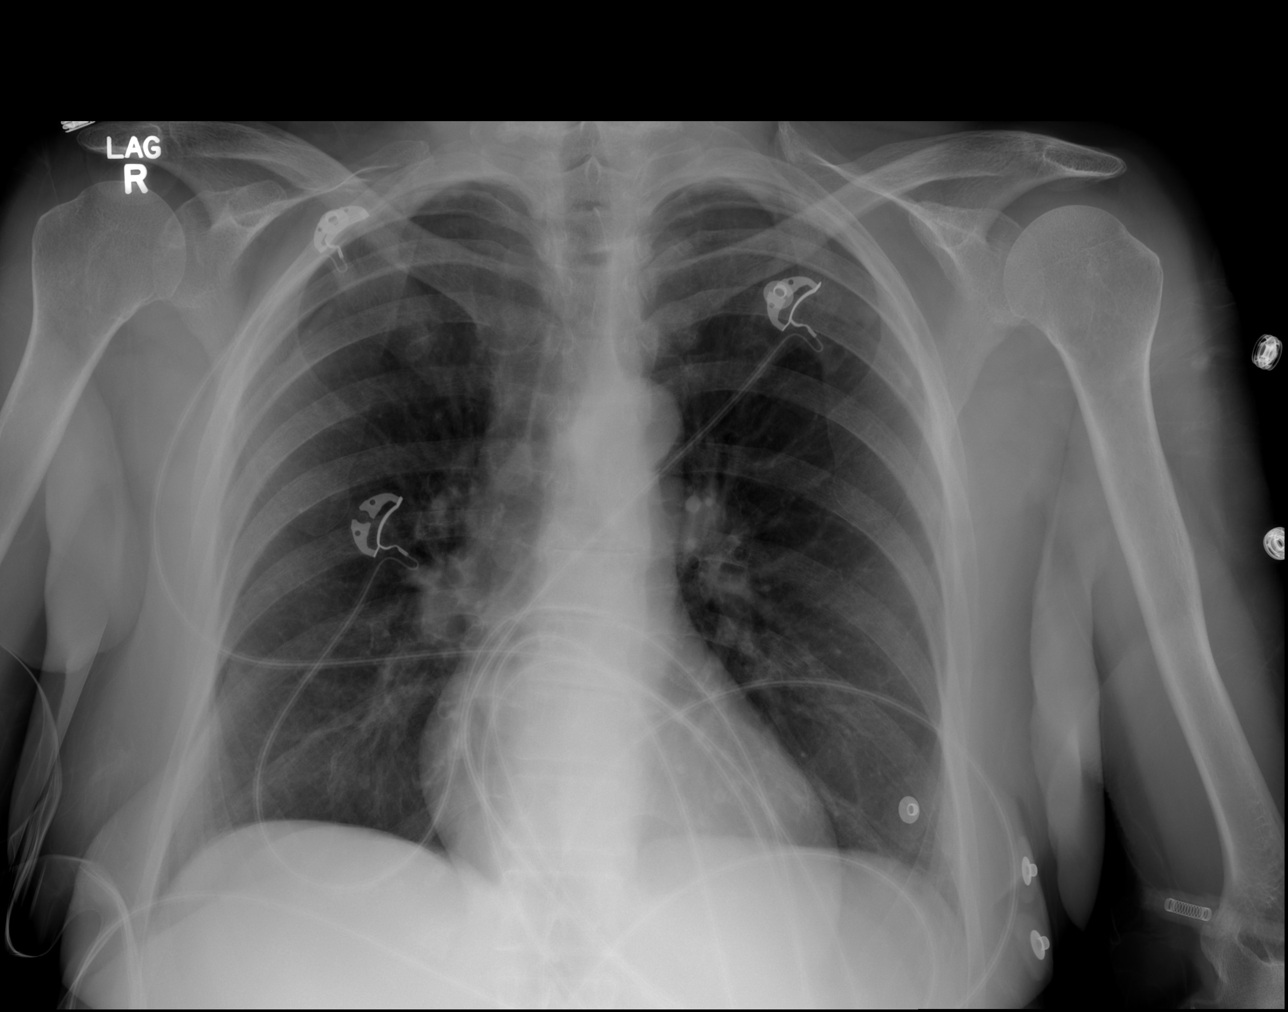

[3 of 3 positions shown; findings below may reference images not displayed]

FINDINGS: The cardiomediastinal contours are normal. The lungs are clear.
There is no free intra-abdominal air. No dilated bowel loops to
suggest obstruction. Small volume of stool throughout the colon.
Calcifications in the left upper quadrant may be vascular but are
poorly localized. No acute osseous abnormalities are seen.
IMPRESSION: 1. Normal bowel gas pattern. No free air.
2. Rounded calcifications projecting over the left upper quadrant
are poorly localized radiographically, may be vascular.
3. No acute pulmonary process.

## 2020-02-02 IMAGING — DX DG ABD PORTABLE 1V
1 series · 1 of 1 positions shown · non-contrast
Comparison: None.

CLINICAL DATA: Small-bowel obstruction

EXAM:
PORTABLE ABDOMEN - 1 VIEW

[abdomen kub]
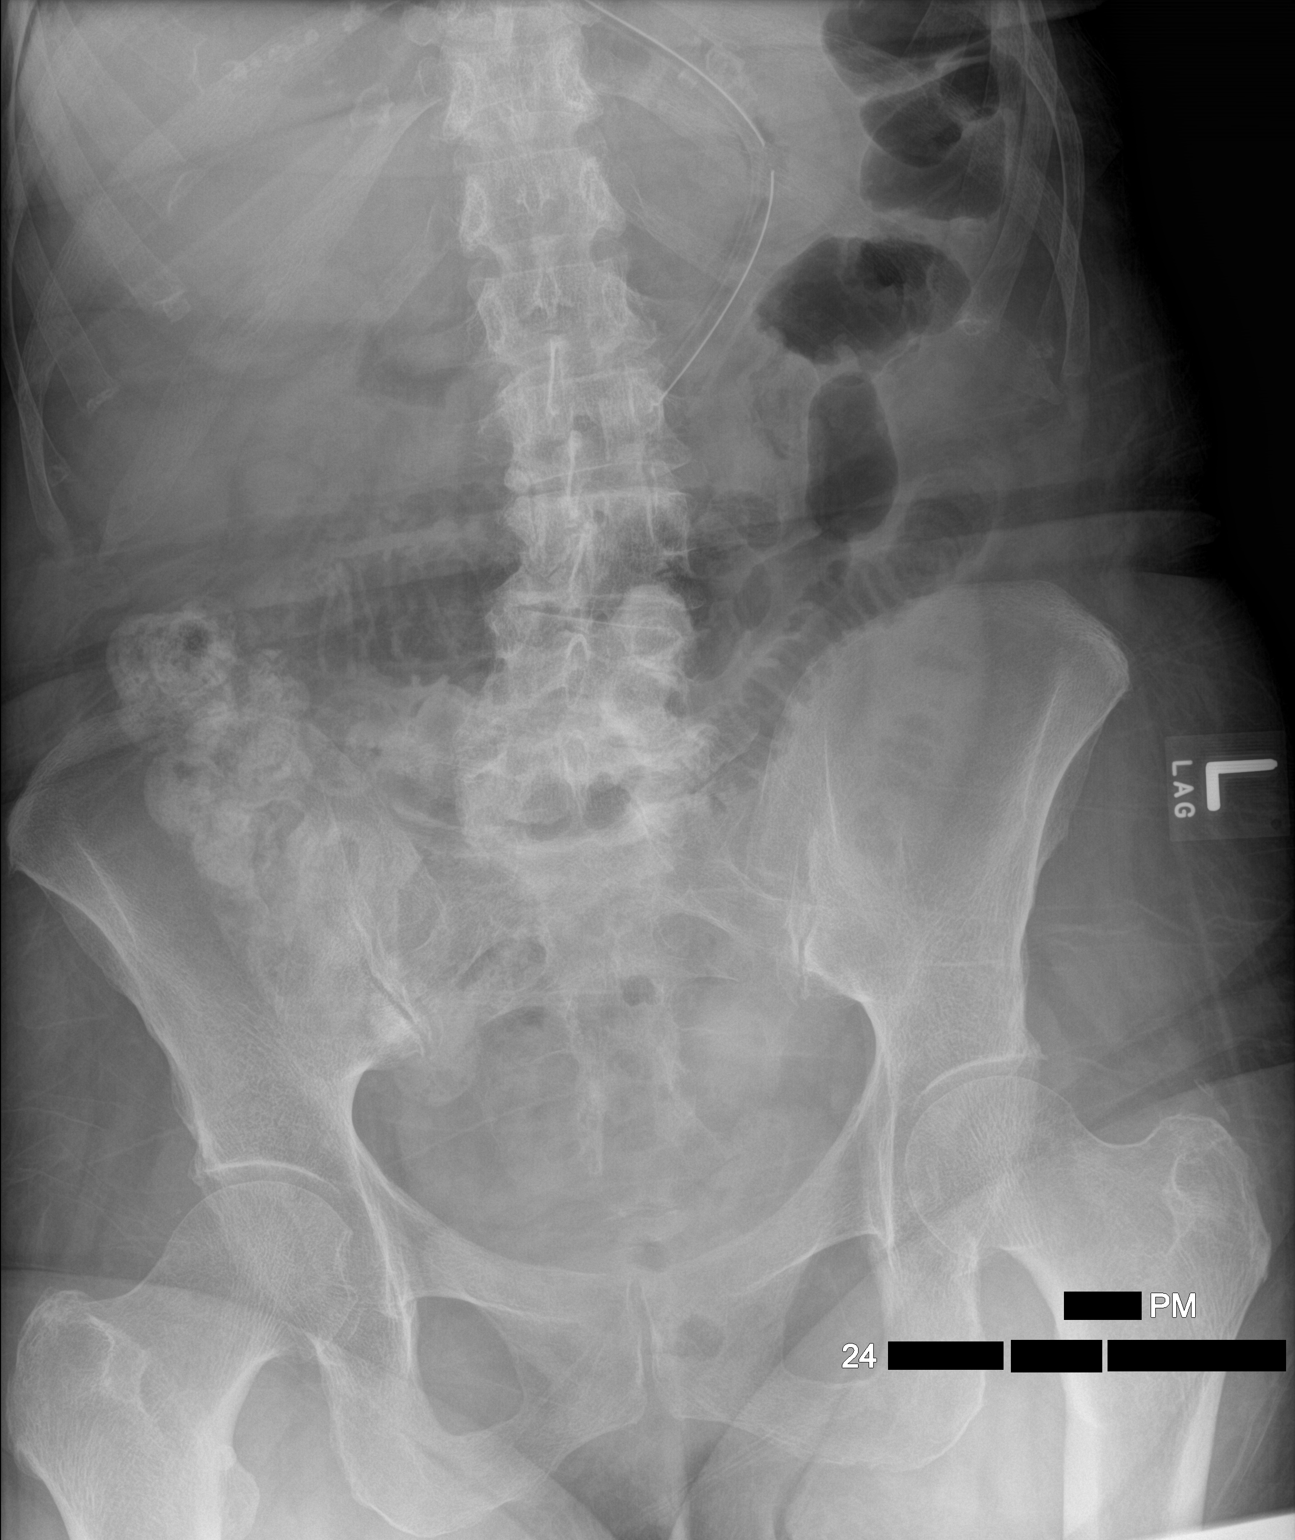

[1 of 1 positions shown; findings below may reference images not displayed]

FINDINGS: There is enteric contrast now within the ascending colon.
Nasogastric tube tip and side port project over the gastric body. No
dilated small bowel.
IMPRESSION: Enteric contrast now within the ascending colon.No dilated bowel
visualized.

## 2020-02-02 IMAGING — DX DG ABD PORTABLE 1V
1 series · 1 of 1 positions shown · non-contrast
Comparison: 05/28/2018

CLINICAL DATA: Small bowel obstruction.

EXAM:
PORTABLE ABDOMEN - 1 VIEW

[abdomen kub]
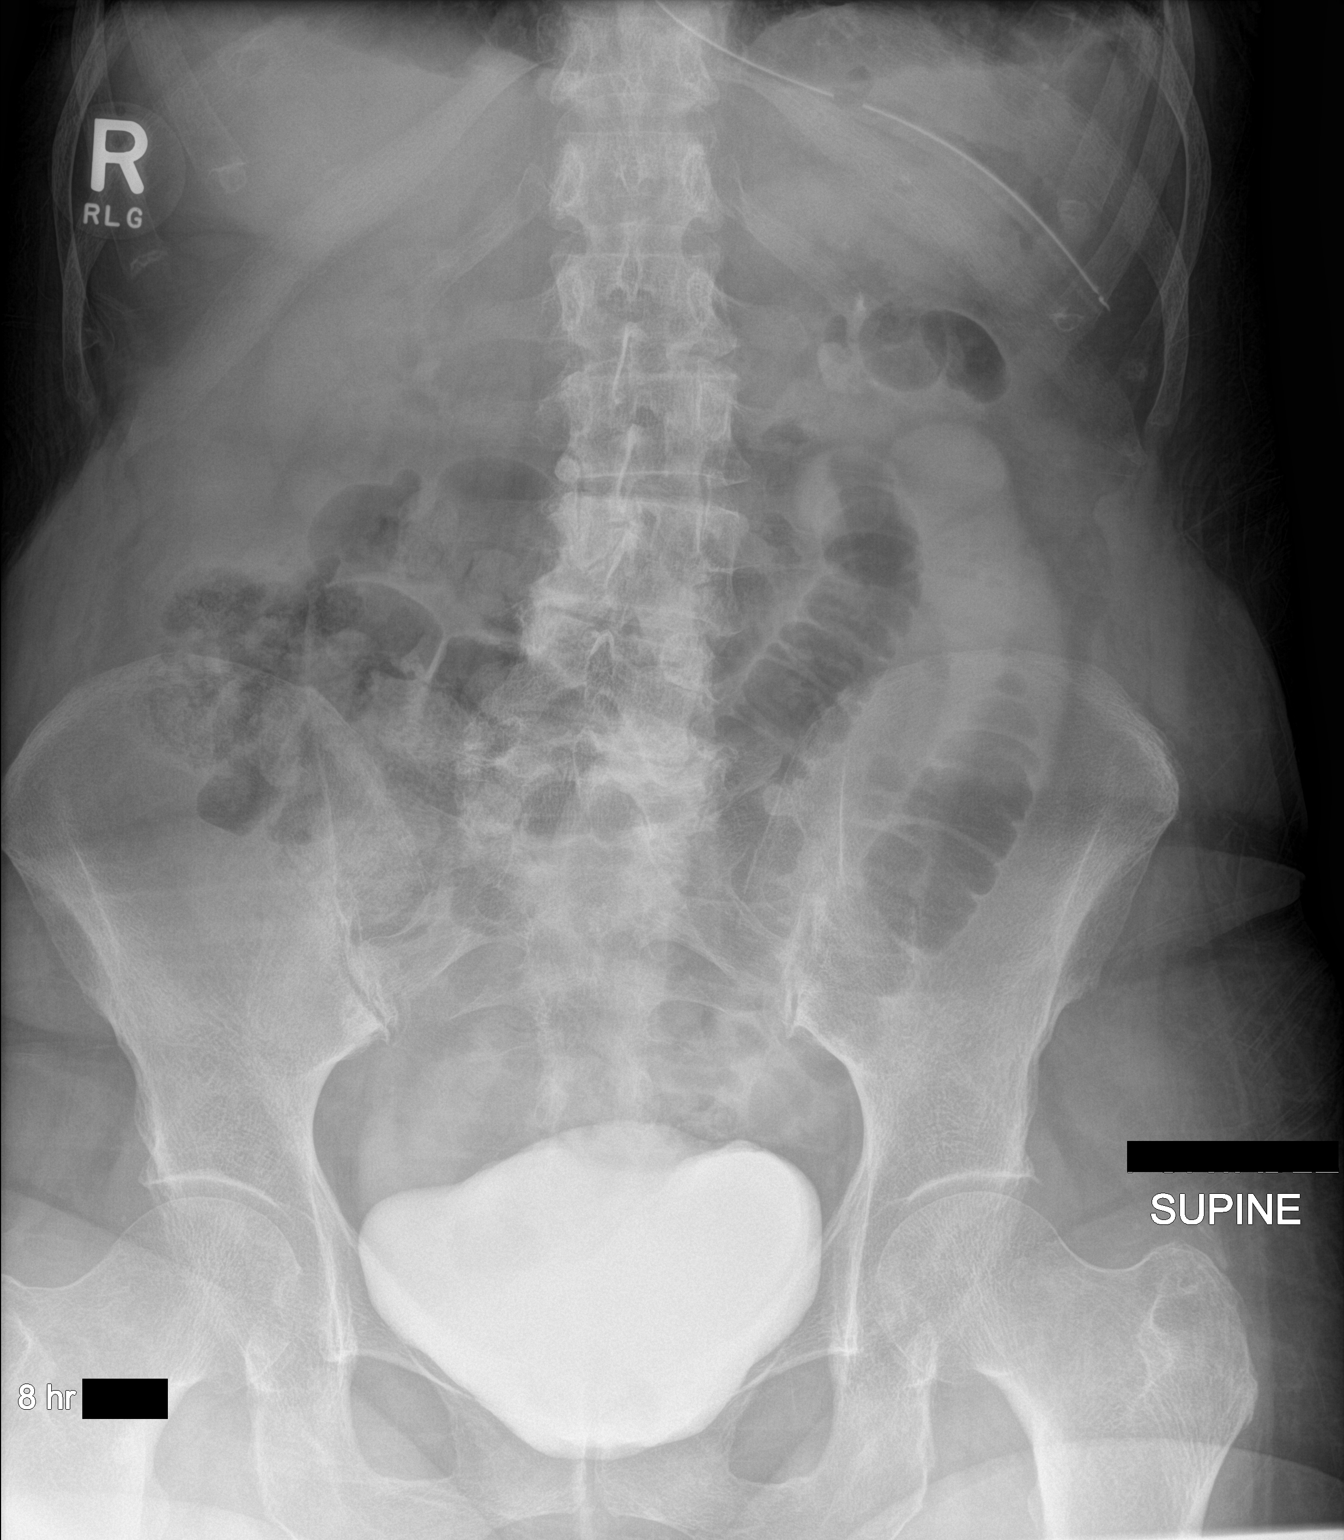

[1 of 1 positions shown; findings below may reference images not displayed]

FINDINGS: NG tube is noted in the stomach. Contrast material from prior CT
noted in the urinary bladder. Contrast noted within small bowel
loops. Dilated small bowel loops. No organomegaly or free air. Gas
and stool noted within nondistended colon.
IMPRESSION: Gas and oral contrast material noted within the small bowel which is
dilated compatible with small bowel obstruction. No definite passage
of contrast material into the colon.

## 2020-03-09 DIAGNOSIS — E7800 Pure hypercholesterolemia, unspecified: Secondary | ICD-10-CM | POA: Diagnosis not present

## 2020-03-09 DIAGNOSIS — I872 Venous insufficiency (chronic) (peripheral): Secondary | ICD-10-CM | POA: Diagnosis not present

## 2020-03-09 DIAGNOSIS — D6869 Other thrombophilia: Secondary | ICD-10-CM | POA: Diagnosis not present

## 2020-03-09 DIAGNOSIS — Z23 Encounter for immunization: Secondary | ICD-10-CM | POA: Diagnosis not present

## 2020-03-09 DIAGNOSIS — I480 Paroxysmal atrial fibrillation: Secondary | ICD-10-CM | POA: Diagnosis not present

## 2020-03-09 DIAGNOSIS — J301 Allergic rhinitis due to pollen: Secondary | ICD-10-CM | POA: Diagnosis not present

## 2020-03-09 DIAGNOSIS — R05 Cough: Secondary | ICD-10-CM | POA: Diagnosis not present

## 2020-03-14 ENCOUNTER — Other Ambulatory Visit: Payer: Self-pay | Admitting: Interventional Cardiology

## 2020-03-16 ENCOUNTER — Other Ambulatory Visit: Payer: Self-pay

## 2020-03-16 MED ORDER — ATORVASTATIN CALCIUM 80 MG PO TABS: ORAL_TABLET | ORAL | 0 refills | Status: DC

## 2020-03-17 DIAGNOSIS — I480 Paroxysmal atrial fibrillation: Secondary | ICD-10-CM | POA: Diagnosis not present

## 2020-03-17 DIAGNOSIS — E7800 Pure hypercholesterolemia, unspecified: Secondary | ICD-10-CM | POA: Diagnosis not present

## 2020-03-18 DIAGNOSIS — Z23 Encounter for immunization: Secondary | ICD-10-CM | POA: Diagnosis not present

## 2020-04-25 DIAGNOSIS — D6869 Other thrombophilia: Secondary | ICD-10-CM | POA: Diagnosis not present

## 2020-04-25 DIAGNOSIS — Z79899 Other long term (current) drug therapy: Secondary | ICD-10-CM | POA: Diagnosis not present

## 2020-04-25 DIAGNOSIS — I480 Paroxysmal atrial fibrillation: Secondary | ICD-10-CM | POA: Diagnosis not present

## 2020-04-25 DIAGNOSIS — E7800 Pure hypercholesterolemia, unspecified: Secondary | ICD-10-CM | POA: Diagnosis not present

## 2020-04-25 DIAGNOSIS — Z1389 Encounter for screening for other disorder: Secondary | ICD-10-CM | POA: Diagnosis not present

## 2020-04-25 DIAGNOSIS — Z0000 Encounter for general adult medical examination without abnormal findings: Secondary | ICD-10-CM | POA: Diagnosis not present

## 2020-06-11 ENCOUNTER — Other Ambulatory Visit: Payer: Self-pay | Admitting: Interventional Cardiology

## 2020-06-12 ENCOUNTER — Other Ambulatory Visit: Payer: Self-pay | Admitting: Interventional Cardiology

## 2020-06-20 NOTE — Progress Notes (Signed)
Cardiology Office Note:    Date:  06/24/2020   ID:  Sherry Barnett, DOB Dec 02, 1932, MRN [id number]  PCP:  Lajean Manes, MD  Cardiologist:  Sinclair Grooms, MD   Referring MD: Lajean Manes, MD   Chief Complaint  Patient presents with  . Coronary Artery Disease  . Atrial Fibrillation    History of Present Illness:    Sherry Barnett is a 87 y.o. female with a hx of CAD 2018 high-grade obstruction mid [name] treated with a drug-eluting stent), paroxysmal/episodic atrial fibrillation, and hyperlipidemia.  She is doing well.  Occasional episodes of palpitation.  These episodes usually occur at night.  They occur after she gets back in bed after going to the bathroom.  She denies chest pain, orthopnea, and PND.  She is ambulating without restraint/limitations.  She has not had angina of any significant degree and does not use nitroglycerin.  Past Medical History:  Diagnosis Date  . A-fib ([place])   . Anemia   . Arthritis    "a little in my hands" (08/13/2017)  . Blepharitis    chronic  . CAD (coronary artery disease), native coronary artery    10/18 PCI/DES to p/mLAD, normal EF  . Cardiovascular risk factor    23%  . Degenerative joint disease (DJD) of lumbar spine   . Diverticulosis   . Endometriosis   . Fibroids   . GERD (gastroesophageal reflux disease)   . Hematuria, microscopic [id number]  . Hemorrhoids   . Hypercholesterolemia    10 years  . Osteopenia 03/2016   start alendrodate  . PONV (postoperative nausea and vomiting)   . Renal disease    stage2/stage 3    Past Surgical History:  Procedure Laterality Date  . APPENDECTOMY    . CARDIAC CATHETERIZATION  08/13/2017  . CATARACT EXTRACTION W/ INTRAOCULAR LENS  IMPLANT, BILATERAL Bilateral 2001 -  11/2016   "left-right"  . CORONARY ANGIOPLASTY WITH STENT PLACEMENT    . CORONARY STENT INTERVENTION N/A 03/14/2017   Procedure: CORONARY STENT INTERVENTION;  Surgeon: Belva Crome, MD;  Location: [facility];  Service:  Cardiovascular;  Laterality: N/A;  . DILATION AND CURETTAGE OF UTERUS    . LEFT HEART CATH AND CORONARY ANGIOGRAPHY N/A 03/14/2017   Procedure: LEFT HEART CATH AND CORONARY ANGIOGRAPHY;  Surgeon: Belva Crome, MD;  Location: [facility];  Service: Cardiovascular;  Laterality: N/A;  . LEFT HEART CATH AND CORONARY ANGIOGRAPHY N/A 08/13/2017   Procedure: LEFT HEART CATH AND CORONARY ANGIOGRAPHY;  Surgeon: Belva Crome, MD;  Location: [facility];  Service: Cardiovascular;  Laterality: N/A;  . OVARIAN CYST SURGERY Bilateral   . SALPINGOOPHORECTOMY Bilateral    endometriosis; "still have a piece of my left ovary"  . TONSILLECTOMY      Current Medications: Current Meds  Medication Sig  . alendronate (FOSAMAX) 70 MG tablet Take 70 mg by mouth every Saturday. Take with a full glass of water on an empty stomach.  Marland Kitchen apixaban (ELIQUIS) 5 MG TABS tablet Take 1 tablet (5 mg total) by mouth 2 (two) times daily.  . Artificial Tear Ointment (ARTIFICIAL TEARS) ointment Place 1 drop into both eyes 4 (four) times daily as needed (dry eyes). Refresh Tears for dry eyes.  Marland Kitchen atorvastatin (LIPITOR) 80 MG tablet TAKE 1 TABLET BY MOUTH EVERY DAY AT 6 PM** KEEP UPCOMING APPOINTMENT IN JANUARY 2022 WITH DISCARD REMAINDER Lucill Mauck FOR FUTURE REFILLS  . calcium carbonate (TUMS EX) 750 MG  chewable tablet Chew 1-2 tablets by mouth 2 (two) times daily as needed (for hearburn/indigestion.).   Marland Kitchen cholecalciferol (VITAMIN D) 1000 units tablet Take 1,000 Units by mouth daily.  . isosorbide mononitrate (IMDUR) 60 MG 24 hr tablet TAKE 1 TABLET(60 MG) BY MOUTH DAILY  . metoprolol succinate (TOPROL-XL) 50 MG 24 hr tablet TAKE 1 TABLET BY MOUTH DAILY WITH OR IMMEDIATELY FOLLOWING A MEAL  . metroNIDAZOLE (METROCREAM) 0.75 % cream Apply 1 application topically 2 (two) times daily. roseca  . nitroGLYCERIN (NITROSTAT) 0.4 MG SL tablet DISSOLVE 1 TABLET UNDER THE TONGUE EVERY 5 MINUTES AS NEEDED FOR CHEST PAIN  .  tobramycin-dexamethasone (TOBRADEX) ophthalmic ointment Place 1 application into both eyes See admin instructions. Tuesday AND Saturday patient does not use. Will apply at night on all other nights.     Allergies:   Sulfa antibiotics, Novocain [procaine], and Penicillins   Social History   Socioeconomic History  . Marital status: Married    Spouse name: Not on file  . Number of children: Not on file  . Years of education: Not on file  . Highest education level: Not on file  Occupational History  . Not on file  Tobacco Use  . Smoking status: Former Smoker    Packs/day: 0.10    Years: 5.00    Pack years: 0.50    Types: Cigarettes    Quit date: 02/06/1969    Years since quitting: 51.4  . Smokeless tobacco: Never Used  Vaping Use  . Vaping Use: Never used  Substance and Sexual Activity  . Alcohol use: No  . Drug use: No  . Sexual activity: Not on file    Comment: MARRIED  Other Topics Concern  . Not on file  Social History Narrative  . Not on file   Social Determinants of Health   Financial Resource Strain: Not on file  Food Insecurity: Not on file  Transportation Needs: Not on file  Physical Activity: Not on file  Stress: Not on file  Social Connections: Not on file     Family History: The patient's family history includes Alzheimer's disease in her father; Heart attack in her mother; Hypertension in her father; Other (age of onset: 15) in her sister; Stroke in her father.  ROS:   Please see the history of present illness.    She has played the piano at church without difficulty.  She has not noticed bleeding in urine or blood in her stool.  She is under stress because her husband Mr. Martella Medine was recently diagnosed with lymphoma.  All other systems reviewed and are negative.  EKGs/Labs/Other Studies Reviewed:    The following studies were reviewed today:  Labs from November 2021: LDL cholesterol 54, creatinine 0.910, hemoglobin 13.2, potassium 4.4  EKG: Sinus  rhythm, without evidence of infarction or change when compared to prior 05/20/2019   Recent Labs: No results found for requested labs within last 8760 hours.  Recent Lipid Panel    Component Value Date/Time   CHOL 116 02/09/2019 1044   TRIG 64 02/09/2019 1044   HDL 56 02/09/2019 1044   CHOLHDL 2.1 02/09/2019 1044   LDLCALC 46 02/09/2019 1044    Physical Exam:    VS:  BP 124/64   Pulse 64   Ht 5' 2.5" (1.588 m)   Wt 134 lb 9.6 oz (61.1 kg)   SpO2 99%   BMI 24.23 kg/m     Wt Readings from Last 3 Encounters:  06/24/20 134 lb 9.6  oz (61.1 kg)  05/20/19 137 lb 12.8 oz (62.5 kg)  02/09/19 137 lb 12.8 oz (62.5 kg)     GEN: Appears younger than stated age. No acute distress HEENT: Normal NECK: No JVD. LYMPHATICS: No lymphadenopathy CARDIAC: No murmur. RRR S4 but no S3 gallo.  There is no peripheral edema. VASCULAR:  Normal Pulses. No bruits. RESPIRATORY:  Clear to auscultation without rales, wheezing or rhonchi  ABDOMEN: Soft, non-tender, non-distended, No pulsatile mass, MUSCULOSKELETAL: No deformity  SKIN: Warm and dry NEUROLOGIC:  Alert and oriented x 3 PSYCHIATRIC:  Normal affect   ASSESSMENT:    1. Coronary artery disease involving native coronary artery of native heart with angina pectoris (HCC)   2. Paroxysmal atrial fibrillation ([place])   3. Hyperlipidemia LDL goal <70   4. Chronic anticoagulation   5. Educated about COVID-19 virus infection    PLAN:    In order of problems listed above:  1. We reviewed secondary prevention measures. 2. She is cautioned to alert [place] if increasing episodes of palpitations/atrial fibrillation occur. 3. Continue Lipitor 80 mg/day. 4. Continue Eliquis 5 mg twice daily with hemoglobin and creatinine twice yearly.  Continue beta-blocker therapy, Toprol-XL 50 mg/day. 5. Vaccinated and boosted.  Practicing medication.  Overall education and awareness concerning /secondary risk prevention was discussed in detail: LDL less than 70,  hemoglobin A1c less than 7, blood pressure target less than 130/80 mmHg, >150 minutes of moderate aerobic activity per week, avoidance of smoking, weight control (via diet and exercise), and continued surveillance/management of/for obstructive sleep apnea.  3-month follow-up with PA-C and me in 12 months.  Medication Adjustments/Labs and Tests Ordered: Current medicines are reviewed at length with the patient today.  Concerns regarding medicines are outlined above.  Orders Placed This Encounter  Procedures  . EKG 12-Lead   No orders of the defined types were placed in this encounter.   There are no Patient Instructions on file for this visit.   Signed, Sinclair Grooms, MD  06/24/2020 10:03 AM    [place]

## 2020-06-24 ENCOUNTER — Encounter: Payer: Self-pay | Admitting: Interventional Cardiology

## 2020-06-24 ENCOUNTER — Ambulatory Visit (INDEPENDENT_AMBULATORY_CARE_PROVIDER_SITE_OTHER): Payer: Medicare Other | Admitting: Interventional Cardiology

## 2020-06-24 ENCOUNTER — Other Ambulatory Visit: Payer: Self-pay

## 2020-06-24 VITALS — BP 124/64 | HR 64 | Ht 62.5 in | Wt 134.6 lb

## 2020-06-24 DIAGNOSIS — Z7901 Long term (current) use of anticoagulants: Secondary | ICD-10-CM | POA: Diagnosis not present

## 2020-06-24 DIAGNOSIS — I480 Paroxysmal atrial fibrillation: Secondary | ICD-10-CM | POA: Diagnosis not present

## 2020-06-24 DIAGNOSIS — Z7189 Other specified counseling: Secondary | ICD-10-CM

## 2020-06-24 DIAGNOSIS — E785 Hyperlipidemia, unspecified: Secondary | ICD-10-CM | POA: Diagnosis not present

## 2020-06-24 DIAGNOSIS — I25119 Atherosclerotic heart disease of native coronary artery with unspecified angina pectoris: Secondary | ICD-10-CM

## 2020-06-24 NOTE — Patient Instructions (Signed)
Medication Instructions:  Your physician recommends that you continue on your current medications as directed. Please refer to the Current Medication list given to you today.  *If you need a refill on your cardiac medications before your next appointment, please call your pharmacy*   Lab Work: None If you have labs (blood work) drawn today and your tests are completely normal, you will receive your results only by: Marland Kitchen MyChart Message (if you have MyChart) OR . A paper copy in the mail If you have any lab test that is abnormal or we need to change your treatment, we will call you to review the results.   Testing/Procedures: None   Follow-Up: At [facility], you and your health needs are our priority.  As part of our continuing mission to provide you with exceptional heart care, we have created designated Provider Care Teams.  These Care Teams include your primary Cardiologist (physician) and Advanced Practice Providers (APPs -  Physician Assistants and Nurse Practitioners) who all work together to provide you with the care you need, when you need it.  We recommend signing up for the patient portal called "MyChart".  Sign up information is provided on this After Visit Summary.  MyChart is used to connect with patients for Virtual Visits (Telemedicine).  Patients are able to view lab/test results, encounter notes, upcoming appointments, etc.  Non-urgent messages can be sent to your provider as well.   To learn more about what you can do with MyChart, go to [email or web address].    Your next appointment:   6 month(s)  The format for your next appointment:   In Person  Provider:   You will see one of the following Advanced Practice Providers on your designated Care Team:    Cecilie Kicks, NP  Kathyrn Drown, NP  Then, Sinclair Grooms, MD will plan to see you again in 1 year(s).   Other Instructions

## 2020-07-18 DIAGNOSIS — H0100B Unspecified blepharitis left eye, upper and lower eyelids: Secondary | ICD-10-CM | POA: Diagnosis not present

## 2020-07-18 DIAGNOSIS — H02402 Unspecified ptosis of left eyelid: Secondary | ICD-10-CM | POA: Diagnosis not present

## 2020-07-18 DIAGNOSIS — Z961 Presence of intraocular lens: Secondary | ICD-10-CM | POA: Diagnosis not present

## 2020-07-18 DIAGNOSIS — H52203 Unspecified astigmatism, bilateral: Secondary | ICD-10-CM | POA: Diagnosis not present

## 2020-09-10 ENCOUNTER — Other Ambulatory Visit: Payer: Self-pay | Admitting: Interventional Cardiology

## 2020-10-14 DIAGNOSIS — R059 Cough, unspecified: Secondary | ICD-10-CM | POA: Diagnosis not present

## 2020-10-14 DIAGNOSIS — Z20822 Contact with and (suspected) exposure to covid-19: Secondary | ICD-10-CM | POA: Diagnosis not present

## 2021-01-05 NOTE — Progress Notes (Deleted)
Cardiology Office Note   Date:  01/05/2021   ID:  Areebah, Shahzad 1932/10/09, MRN [id number]  PCP:  Lajean Manes, MD  Cardiologist:  Dr. Tamala Julian, MD   No chief complaint on file.   History of Present Illness: Sherry Barnett is a 87 y.o. female who presents for follow up, seen for Dr. Tamala Julian.   [name] has a hx of CAD with known high grade obstruction in the mLAD vessel treated with PCI/DES, PAF on AC with Eliquis and HLD.   She was most recently seen in follow up with Dr. Tamala Julian at which time she had complaints of intermittent palpitations. She was educated about increased frequency of AF. No changes were made at that time.   Today    1. CAD: -Denies anginal symptoms   2. PAF: -AC with Eliquis -CBC    3. HLD: -Last LDL,  -Continue high intensity statin   4.    Past Medical History:  Diagnosis Date   A-fib ([place])    Anemia    Arthritis    "a little in my hands" (08/13/2017)   Blepharitis    chronic   CAD (coronary artery disease), native coronary artery    10/18 PCI/DES to p/mLAD, normal EF   Cardiovascular risk factor    23%   Degenerative joint disease (DJD) of lumbar spine    Diverticulosis    Endometriosis    Fibroids    GERD (gastroesophageal reflux disease)    Hematuria, microscopic 19993   Hemorrhoids    Hypercholesterolemia    10 years   Osteopenia 03/2016   start alendrodate   PONV (postoperative nausea and vomiting)    Renal disease    stage2/stage 3    Past Surgical History:  Procedure Laterality Date   APPENDECTOMY     CARDIAC CATHETERIZATION  08/13/2017   CATARACT EXTRACTION W/ INTRAOCULAR LENS  IMPLANT, BILATERAL Bilateral 2001 -  11/2016   "left-right"   CORONARY ANGIOPLASTY WITH STENT PLACEMENT     CORONARY STENT INTERVENTION N/A 03/14/2017   Procedure: CORONARY STENT INTERVENTION;  Surgeon: Belva Crome, MD;  Location: [facility];  Service: Cardiovascular;  Laterality: N/A;   DILATION AND CURETTAGE OF UTERUS      LEFT HEART CATH AND CORONARY ANGIOGRAPHY N/A 03/14/2017   Procedure: LEFT HEART CATH AND CORONARY ANGIOGRAPHY;  Surgeon: Belva Crome, MD;  Location: [facility];  Service: Cardiovascular;  Laterality: N/A;   LEFT HEART CATH AND CORONARY ANGIOGRAPHY N/A 08/13/2017   Procedure: LEFT HEART CATH AND CORONARY ANGIOGRAPHY;  Surgeon: Belva Crome, MD;  Location: [facility];  Service: Cardiovascular;  Laterality: N/A;   OVARIAN CYST SURGERY Bilateral    SALPINGOOPHORECTOMY Bilateral    endometriosis; "still have a piece of my left ovary"   TONSILLECTOMY       Current Outpatient Medications  Medication Sig Dispense Refill   alendronate (FOSAMAX) 70 MG tablet Take 70 mg by mouth every Saturday. Take with a full glass of water on an empty stomach.     apixaban (ELIQUIS) 5 MG TABS tablet Take 1 tablet (5 mg total) by mouth 2 (two) times daily. 60 tablet 1   Artificial Tear Ointment (ARTIFICIAL TEARS) ointment Place 1 drop into both eyes 4 (four) times daily as needed (dry eyes). Refresh Tears for dry eyes.     atorvastatin (LIPITOR) 80 MG tablet TAKE 1 TABLET BY MOUTH DAILY AT 6:00 PM. 90 tablet 2  calcium carbonate (TUMS EX) 750 MG chewable tablet Chew 1-2 tablets by mouth 2 (two) times daily as needed (for hearburn/indigestion.).      cholecalciferol (VITAMIN D) 1000 units tablet Take 1,000 Units by mouth daily.     isosorbide mononitrate (IMDUR) 60 MG 24 hr tablet TAKE 1 TABLET(60 MG) BY MOUTH DAILY 90 tablet 2   metoprolol succinate (TOPROL-XL) 50 MG 24 hr tablet TAKE 1 TABLET BY MOUTH DAILY WITH OR IMMEDIATELY FOLLOWING A MEAL 90 tablet 2   metroNIDAZOLE (METROCREAM) 0.75 % cream Apply 1 application topically 2 (two) times daily. roseca     nitroGLYCERIN (NITROSTAT) 0.4 MG SL tablet DISSOLVE 1 TABLET UNDER THE TONGUE EVERY 5 MINUTES AS NEEDED FOR CHEST PAIN 25 tablet 3   tobramycin-dexamethasone (TOBRADEX) ophthalmic ointment Place 1 application into both eyes See admin  instructions. Tuesday AND Saturday patient does not use. Will apply at night on all other nights.     No current facility-administered medications for this visit.    Allergies:   Sulfa antibiotics, Novocain [procaine], and Penicillins    Social History:  The patient  reports that she quit smoking about 51 years ago. Her smoking use included cigarettes. She has a 0.50 pack-year smoking history. She has never used smokeless tobacco. She reports that she does not drink alcohol and does not use drugs.   Family History:  The patient's family history includes Alzheimer's disease in her father; Heart attack in her mother; Hypertension in her father; Other (age of onset: 71) in her sister; Stroke in her father.    ROS:  Please see the history of present illness. Otherwise, review of systems are positive for none.   All other systems are reviewed and negative.    PHYSICAL EXAM: VS:  There were no vitals taken for this visit. , BMI There is no height or weight on file to calculate BMI.  General: Well developed, well nourished, NAD Skin: Warm, dry, intact  Head: Normocephalic, atraumatic, sclera non-icteric, no xanthomas, clear, moist mucus membranes. Neck: Negative for carotid bruits. No JVD Lungs:Clear to ausculation bilaterally. No wheezes, rales, or rhonchi. Breathing is unlabored. Cardiovascular: RRR with S1 S2. No murmurs, rubs, gallops, or LV heave appreciated. Abdomen: Soft, non-tender, non-distended with normoactive bowel sounds. No hepatomegaly, No rebound/guarding. No obvious abdominal masses. MSK: Strength and tone appear normal for age. 5/5 in all extremities Extremities: No edema. No clubbing or cyanosis. DP/PT pulses 2+ bilaterally Neuro: Alert and oriented. No focal deficits. No facial asymmetry. MAE spontaneously. Psych: Responds to questions appropriately with normal affect.     EKG:  EKG {ACTION; IS/IS [id number] ordered today. The ekg ordered today demonstrates  ***   Recent Labs: No results found for requested labs within last 8760 hours.   Lipid Panel    Component Value Date/Time   CHOL 116 02/09/2019 1044   TRIG 64 02/09/2019 1044   HDL 56 02/09/2019 1044   CHOLHDL 2.1 02/09/2019 1044   LDLCALC 46 02/09/2019 1044     Wt Readings from Last 3 Encounters:  06/24/20 134 lb 9.6 oz (61.1 kg)  05/20/19 137 lb 12.8 oz (62.5 kg)  02/09/19 137 lb 12.8 oz (62.5 kg)    Other studies Reviewed: Additional studies/ records that were reviewed today include: . Review of the above records demonstrates:  Stress test 05/2018:  1. No reversible ischemia. Concern for infarction in the inferoseptal wall. Infarction versus breast attenuation anterior wall.   2. Normal left ventricular wall motion. Wall motion difficult to  assess due to motion.   3. Left ventricular ejection fraction 48%   4. Non invasive risk stratification*: Intermediate (risk stratification primarily weighted to low EF)   ASSESSMENT AND PLAN:  1.  ***   Current medicines are reviewed at length with the patient today.  The patient {ACTIONS; HAS/DOES NOT HAVE:19233} concerns regarding medicines.  The following changes have been made:  {PLAN; NO CHANGE:13088:s}  Labs/ tests ordered today include: *** No orders of the defined types were placed in this encounter.    Disposition:   FU with *** in {gen number [id number] {Days to years:10300}  Signed, Kathyrn Drown, NP  01/05/2021 12:14 PM    [facility] [address], [place], [place]  [address] Phone: [phone]; Fax: [phone]

## 2021-01-09 ENCOUNTER — Ambulatory Visit: Payer: Medicare Other | Admitting: Cardiology

## 2021-01-24 DIAGNOSIS — H0100B Unspecified blepharitis left eye, upper and lower eyelids: Secondary | ICD-10-CM | POA: Diagnosis not present

## 2021-05-16 DIAGNOSIS — L718 Other rosacea: Secondary | ICD-10-CM | POA: Diagnosis not present

## 2021-05-16 DIAGNOSIS — L821 Other seborrheic keratosis: Secondary | ICD-10-CM | POA: Diagnosis not present

## 2021-05-16 DIAGNOSIS — L820 Inflamed seborrheic keratosis: Secondary | ICD-10-CM | POA: Diagnosis not present

## 2021-05-16 DIAGNOSIS — L304 Erythema intertrigo: Secondary | ICD-10-CM | POA: Diagnosis not present

## 2021-06-07 ENCOUNTER — Other Ambulatory Visit: Payer: Self-pay | Admitting: Interventional Cardiology

## 2021-07-14 ENCOUNTER — Other Ambulatory Visit: Payer: Self-pay | Admitting: Interventional Cardiology

## 2021-07-21 ENCOUNTER — Observation Stay (HOSPITAL_COMMUNITY)
Admission: EM | Admit: 2021-07-21 | Discharge: 2021-07-22 | Disposition: A | Discharge status: Home / Self Care | Payer: Medicare Other | Attending: Cardiovascular Disease | Admitting: Cardiovascular Disease

## 2021-07-21 ENCOUNTER — Other Ambulatory Visit: Payer: Self-pay

## 2021-07-21 ENCOUNTER — Observation Stay (HOSPITAL_COMMUNITY): Payer: Medicare Other

## 2021-07-21 ENCOUNTER — Emergency Department (HOSPITAL_COMMUNITY): Payer: Medicare Other

## 2021-07-21 ENCOUNTER — Encounter (HOSPITAL_COMMUNITY)
Admission: EM | Disposition: A | Discharge status: Home / Self Care | Payer: Self-pay | Source: Home / Self Care | Attending: Emergency Medicine

## 2021-07-21 ENCOUNTER — Encounter (HOSPITAL_COMMUNITY): Payer: Self-pay | Admitting: Cardiovascular Disease

## 2021-07-21 DIAGNOSIS — I2510 Atherosclerotic heart disease of native coronary artery without angina pectoris: Secondary | ICD-10-CM

## 2021-07-21 DIAGNOSIS — I214 Non-ST elevation (NSTEMI) myocardial infarction: Secondary | ICD-10-CM

## 2021-07-21 DIAGNOSIS — R000 Tachycardia, unspecified: Secondary | ICD-10-CM | POA: Diagnosis not present

## 2021-07-21 DIAGNOSIS — I209 Angina pectoris, unspecified: Secondary | ICD-10-CM

## 2021-07-21 DIAGNOSIS — R079 Chest pain, unspecified: Secondary | ICD-10-CM | POA: Diagnosis not present

## 2021-07-21 DIAGNOSIS — Z20822 Contact with and (suspected) exposure to covid-19: Secondary | ICD-10-CM | POA: Diagnosis not present

## 2021-07-21 DIAGNOSIS — N183 Chronic kidney disease, stage 3 unspecified: Secondary | ICD-10-CM | POA: Diagnosis not present

## 2021-07-21 DIAGNOSIS — I25119 Atherosclerotic heart disease of native coronary artery with unspecified angina pectoris: Secondary | ICD-10-CM

## 2021-07-21 DIAGNOSIS — E782 Mixed hyperlipidemia: Secondary | ICD-10-CM | POA: Diagnosis not present

## 2021-07-21 DIAGNOSIS — E785 Hyperlipidemia, unspecified: Secondary | ICD-10-CM | POA: Diagnosis present

## 2021-07-21 DIAGNOSIS — Z87891 Personal history of nicotine dependence: Secondary | ICD-10-CM | POA: Insufficient documentation

## 2021-07-21 DIAGNOSIS — Z7901 Long term (current) use of anticoagulants: Secondary | ICD-10-CM | POA: Diagnosis not present

## 2021-07-21 DIAGNOSIS — I200 Unstable angina: Secondary | ICD-10-CM

## 2021-07-21 DIAGNOSIS — I25110 Atherosclerotic heart disease of native coronary artery with unstable angina pectoris: Principal | ICD-10-CM | POA: Insufficient documentation

## 2021-07-21 DIAGNOSIS — I4891 Unspecified atrial fibrillation: Secondary | ICD-10-CM | POA: Diagnosis not present

## 2021-07-21 DIAGNOSIS — I480 Paroxysmal atrial fibrillation: Secondary | ICD-10-CM

## 2021-07-21 DIAGNOSIS — R0789 Other chest pain: Secondary | ICD-10-CM | POA: Diagnosis not present

## 2021-07-21 HISTORY — PX: LEFT HEART CATH AND CORONARY ANGIOGRAPHY: CATH118249

## 2021-07-21 LAB — COMPREHENSIVE METABOLIC PANEL
ALT: 25 U/L (ref 0–44)
AST: 25 U/L (ref 15–41)
Albumin: 3.4 g/dL — ABNORMAL LOW (ref 3.5–5.0)
Alkaline Phosphatase: 92 U/L (ref 38–126)
Anion gap: 9 (ref 5–15)
BUN: 20 mg/dL (ref 8–23)
CO2: 26 mmol/L (ref 22–32)
Calcium: 9.2 mg/dL (ref 8.9–10.3)
Chloride: 104 mmol/L (ref 98–111)
Creatinine, Ser: 1 mg/dL (ref 0.44–1.00)
GFR, Estimated: 55 mL/min — ABNORMAL LOW (ref >60)
Glucose, Bld: 118 mg/dL — ABNORMAL HIGH (ref 70–99)
Potassium: 3.9 mmol/L (ref 3.5–5.1)
Sodium: 139 mmol/L (ref 135–145)
Total Bilirubin: 0.6 mg/dL (ref 0.3–1.2)
Total Protein: 6.4 g/dL — ABNORMAL LOW (ref 6.5–8.1)

## 2021-07-21 LAB — RESP PANEL BY RT-PCR (FLU A&B, COVID) ARPGX2
Influenza A by PCR: NEGATIVE
Influenza B by PCR: NEGATIVE
SARS Coronavirus 2 by RT PCR: NEGATIVE

## 2021-07-21 LAB — TROPONIN I (HIGH SENSITIVITY)
Troponin I (High Sensitivity): 7 ng/L (ref <18)
Troponin I (High Sensitivity): 24 ng/L — ABNORMAL HIGH (ref <18)
Troponin I (High Sensitivity): 28 ng/L — ABNORMAL HIGH (ref <18)

## 2021-07-21 LAB — HEMOGLOBIN A1C
Hgb A1c MFr Bld: 5.9 % — ABNORMAL HIGH (ref 4.8–5.6)
Mean Plasma Glucose: 122.63 mg/dL

## 2021-07-21 LAB — MAGNESIUM: Magnesium: 1.9 mg/dL (ref 1.7–2.4)

## 2021-07-21 LAB — LIPID PANEL
Cholesterol: 118 mg/dL (ref 0–200)
HDL: 55 mg/dL (ref >40)
LDL Cholesterol: 55 mg/dL (ref 0–99)
Total CHOL/HDL Ratio: 2.1 RATIO
Triglycerides: 39 mg/dL (ref <150)
VLDL: 8 mg/dL (ref 0–40)

## 2021-07-21 LAB — CBC
HCT: 41.5 % (ref 36.0–46.0)
Hemoglobin: 13.7 g/dL (ref 12.0–15.0)
MCH: 31.4 pg (ref 26.0–34.0)
MCHC: 33 g/dL (ref 30.0–36.0)
MCV: 95 fL (ref 80.0–100.0)
Platelets: 203 10*3/uL (ref 150–400)
RBC: 4.37 MIL/uL (ref 3.87–5.11)
RDW: 13.6 % (ref 11.5–15.5)
WBC: 7.1 10*3/uL (ref 4.0–10.5)
nRBC: 0 % (ref 0.0–0.2)

## 2021-07-21 LAB — CBG MONITORING, ED: Glucose-Capillary: 102 mg/dL — ABNORMAL HIGH (ref 70–99)

## 2021-07-21 LAB — HEPARIN LEVEL (UNFRACTIONATED): Heparin Unfractionated: >1.1 IU/mL — ABNORMAL HIGH (ref 0.30–0.70)

## 2021-07-21 LAB — LIPASE, BLOOD: Lipase: 50 U/L (ref 11–51)

## 2021-07-21 LAB — APTT: aPTT: 83 seconds — ABNORMAL HIGH (ref 24–36)

## 2021-07-21 SURGERY — LEFT HEART CATH AND CORONARY ANGIOGRAPHY
Anesthesia: LOCAL

## 2021-07-21 MED ORDER — ASPIRIN EC 81 MG PO TBEC
81.0000 mg | DELAYED_RELEASE_TABLET | Freq: Every day | ORAL | Status: DC
  Administered 2021-07-22: 81 mg via ORAL
  Filled 2021-07-21: qty 1

## 2021-07-21 MED ORDER — VERAPAMIL HCL 2.5 MG/ML IV SOLN
INTRAVENOUS | Status: DC | PRN
  Administered 2021-07-21: 10 mL via INTRA_ARTERIAL

## 2021-07-21 MED ORDER — ATORVASTATIN CALCIUM 80 MG PO TABS: 80.0000 mg | ORAL_TABLET | Freq: Every day | ORAL | Status: DC

## 2021-07-21 MED ORDER — LIDOCAINE HCL (PF) 1 % IJ SOLN
INTRAMUSCULAR | Status: DC | PRN
  Administered 2021-07-21: 5 mL

## 2021-07-21 MED ORDER — SODIUM CHLORIDE 0.9 % WEIGHT BASED INFUSION: 3.0000 mL/kg/h | INTRAVENOUS | Status: DC

## 2021-07-21 MED ORDER — NITROGLYCERIN 0.4 MG SL SUBL: 0.4000 mg | SUBLINGUAL_TABLET | SUBLINGUAL | Status: DC | PRN

## 2021-07-21 MED ORDER — ASPIRIN 81 MG PO CHEW
81.0000 mg | CHEWABLE_TABLET | ORAL | Status: AC
  Administered 2021-07-21: 81 mg via ORAL
  Filled 2021-07-21: qty 1

## 2021-07-21 MED ORDER — SODIUM CHLORIDE 0.9 % IV SOLN: 250.0000 mL | INTRAVENOUS | Status: DC | PRN

## 2021-07-21 MED ORDER — HEPARIN (PORCINE) IN NACL 1000-0.9 UT/500ML-% IV SOLN
INTRAVENOUS | Status: AC
  Filled 2021-07-21: qty 1000

## 2021-07-21 MED ORDER — METRONIDAZOLE 0.75 % EX CREA
1.0000 "application " | TOPICAL_CREAM | Freq: Two times a day (BID) | CUTANEOUS | Status: DC
  Filled 2021-07-21: qty 45

## 2021-07-21 MED ORDER — HYDRALAZINE HCL 20 MG/ML IJ SOLN: 10.0000 mg | INTRAMUSCULAR | Status: AC | PRN

## 2021-07-21 MED ORDER — ACETAMINOPHEN 325 MG PO TABS: 650.0000 mg | ORAL_TABLET | ORAL | Status: DC | PRN

## 2021-07-21 MED ORDER — HEPARIN (PORCINE) 25000 UT/250ML-% IV SOLN
800.0000 [IU]/h | INTRAVENOUS | Status: DC
  Administered 2021-07-21: 800 [IU]/h via INTRAVENOUS
  Filled 2021-07-21: qty 250

## 2021-07-21 MED ORDER — METOPROLOL SUCCINATE ER 50 MG PO TB24
50.0000 mg | ORAL_TABLET | ORAL | Status: DC
  Administered 2021-07-22: 50 mg via ORAL
  Filled 2021-07-21: qty 1

## 2021-07-21 MED ORDER — HEPARIN SODIUM (PORCINE) 1000 UNIT/ML IJ SOLN
INTRAMUSCULAR | Status: DC | PRN
  Administered 2021-07-21: 3000 [IU] via INTRAVENOUS

## 2021-07-21 MED ORDER — SODIUM CHLORIDE 0.9% FLUSH: 3.0000 mL | INTRAVENOUS | Status: DC | PRN

## 2021-07-21 MED ORDER — APIXABAN 2.5 MG PO TABS
2.5000 mg | ORAL_TABLET | Freq: Two times a day (BID) | ORAL | Status: DC
  Administered 2021-07-22: 2.5 mg via ORAL
  Filled 2021-07-21: qty 1

## 2021-07-21 MED ORDER — MIDAZOLAM HCL 2 MG/2ML IJ SOLN
INTRAMUSCULAR | Status: AC
  Filled 2021-07-21: qty 2

## 2021-07-21 MED ORDER — IOHEXOL 350 MG/ML SOLN
INTRAVENOUS | Status: DC | PRN
  Administered 2021-07-21: 40 mL via INTRACARDIAC

## 2021-07-21 MED ORDER — HEPARIN (PORCINE) IN NACL 1000-0.9 UT/500ML-% IV SOLN
INTRAVENOUS | Status: DC | PRN
  Administered 2021-07-21 (×2): 500 mL

## 2021-07-21 MED ORDER — FENTANYL CITRATE (PF) 100 MCG/2ML IJ SOLN
INTRAMUSCULAR | Status: AC
  Filled 2021-07-21: qty 2

## 2021-07-21 MED ORDER — ISOSORBIDE MONONITRATE ER 60 MG PO TB24
60.0000 mg | ORAL_TABLET | ORAL | Status: DC
  Administered 2021-07-21: 60 mg via ORAL
  Filled 2021-07-21: qty 1

## 2021-07-21 MED ORDER — LABETALOL HCL 5 MG/ML IV SOLN: 10.0000 mg | INTRAVENOUS | Status: AC | PRN

## 2021-07-21 MED ORDER — ISOSORBIDE MONONITRATE ER 60 MG PO TB24: 60.0000 mg | ORAL_TABLET | Freq: Every day | ORAL | Status: DC

## 2021-07-21 MED ORDER — METOPROLOL SUCCINATE ER 50 MG PO TB24: 50.0000 mg | ORAL_TABLET | Freq: Every day | ORAL | Status: DC

## 2021-07-21 MED ORDER — SODIUM CHLORIDE 0.9 % WEIGHT BASED INFUSION: 1.0000 mL/kg/h | INTRAVENOUS | Status: DC

## 2021-07-21 MED ORDER — SODIUM CHLORIDE 0.9 % WEIGHT BASED INFUSION
1.0000 mL/kg/h | INTRAVENOUS | Status: AC
  Administered 2021-07-21: 1 mL/kg/h via INTRAVENOUS

## 2021-07-21 MED ORDER — LIDOCAINE HCL (PF) 1 % IJ SOLN
INTRAMUSCULAR | Status: AC
  Filled 2021-07-21: qty 30

## 2021-07-21 MED ORDER — ASPIRIN 81 MG PO CHEW: 81.0000 mg | CHEWABLE_TABLET | ORAL | Status: DC

## 2021-07-21 MED ORDER — FENTANYL CITRATE (PF) 100 MCG/2ML IJ SOLN
INTRAMUSCULAR | Status: DC | PRN
  Administered 2021-07-21: 25 ug via INTRAVENOUS

## 2021-07-21 MED ORDER — SODIUM CHLORIDE 0.9% FLUSH: 3.0000 mL | Freq: Two times a day (BID) | INTRAVENOUS | Status: DC

## 2021-07-21 MED ORDER — NITROGLYCERIN IN D5W 200-5 MCG/ML-% IV SOLN: 0.0000 ug/min | INTRAVENOUS | Status: DC

## 2021-07-21 MED ORDER — MIDAZOLAM HCL 2 MG/2ML IJ SOLN
INTRAMUSCULAR | Status: DC | PRN
  Administered 2021-07-21: 1 mg via INTRAVENOUS

## 2021-07-21 MED ORDER — ONDANSETRON HCL 4 MG/2ML IJ SOLN: 4.0000 mg | Freq: Four times a day (QID) | INTRAMUSCULAR | Status: DC | PRN

## 2021-07-21 MED ORDER — ALENDRONATE SODIUM 70 MG PO TABS
70.0000 mg | ORAL_TABLET | ORAL | Status: DC
  Filled 2021-07-21: qty 1

## 2021-07-21 MED ORDER — ASPIRIN 81 MG PO CHEW: 324.0000 mg | CHEWABLE_TABLET | Freq: Once | ORAL | Status: DC

## 2021-07-21 MED ORDER — SODIUM CHLORIDE 0.9 % WEIGHT BASED INFUSION
1.0000 mL/kg/h | INTRAVENOUS | Status: DC
  Administered 2021-07-21: 1 mL/kg/h via INTRAVENOUS

## 2021-07-21 MED ORDER — ATORVASTATIN CALCIUM 80 MG PO TABS
80.0000 mg | ORAL_TABLET | Freq: Every day | ORAL | Status: DC
  Administered 2021-07-21: 80 mg via ORAL
  Filled 2021-07-21: qty 1

## 2021-07-21 MED ORDER — SODIUM CHLORIDE 0.9% FLUSH
3.0000 mL | Freq: Two times a day (BID) | INTRAVENOUS | Status: DC
  Administered 2021-07-21: 3 mL via INTRAVENOUS

## 2021-07-21 MED ORDER — METRONIDAZOLE 0.75 % EX GEL
Freq: Two times a day (BID) | CUTANEOUS | Status: DC
  Filled 2021-07-21: qty 45

## 2021-07-21 MED ORDER — VERAPAMIL HCL 2.5 MG/ML IV SOLN
INTRAVENOUS | Status: AC
  Filled 2021-07-21: qty 2

## 2021-07-21 SURGICAL SUPPLY — 12 items
CATH INFINITI 5FR MULTPACK ANG (CATHETERS) ×1 IMPLANT
DEVICE RAD COMP TR BAND LRG (VASCULAR PRODUCTS) ×1 IMPLANT
GLIDESHEATH SLEND SS 6F .021 (SHEATH) ×1 IMPLANT
GUIDEWIRE INQWIRE 1.5J.035X260 (WIRE) IMPLANT
INQWIRE 1.5J .035X260CM (WIRE) ×2
KIT HEART LEFT (KITS) ×2 IMPLANT
PACK CARDIAC CATHETERIZATION (CUSTOM PROCEDURE TRAY) ×2 IMPLANT
SHEATH PROBE COVER 6X72 (BAG) ×1 IMPLANT
SYR MEDRAD MARK 7 150ML (SYRINGE) ×2 IMPLANT
TRANSDUCER W/STOPCOCK (MISCELLANEOUS) ×2 IMPLANT
TUBING CIL FLEX 10 FLL-RA (TUBING) ×2 IMPLANT
WIRE HI TORQ VERSACORE-J 145CM (WIRE) ×1 IMPLANT

## 2021-07-21 NOTE — ED Triage Notes (Signed)
Hx of stents placed, woke up ~1.5 hour w/ 8/10 chest pain that radiated to the jaw. Took two nitros pta and 325mg  asa

## 2021-07-21 NOTE — H&P (Addendum)
Cardiology Admission History and Physical:   Patient ID: Sherry Barnett MRN: [id number]; DOB: [birth year]   Admission date: 07/21/2021  PCP:  Lajean Manes, MD   [facility] Providers Cardiologist:  Sinclair Grooms, MD   Chief Complaint:  chest pain  Patient Profile:   Sherry Barnett is a 88 y.o. female with a history of CAD with DES to [name] in 2018, paroxysmal atrial fibrillation on Eliquis, hyperlipidemia, GERD, CKD stage III who is being seen today for the evaluation of chest pain at the request of Dr. Dayna Barker.  History of Present Illness:   Sherry Barnett is a 88 year old female with the above history who is followed by Dr. Tamala Julian.  She has a history of CAD.  Catheterization and 03/2017 showed 60% stenosis of proximal [name] followed by 90-95% stenosis of the mid [name] obstructive disease of the ostial RCA and distal LCx.  Patient underwent successful PCI with DES to tandem proximal to mid [name] lesions at the time.  Last cardiac cath in 08/2017 showed widely patent [name] stent with 50-70% stenosis of mid to distal [name] (FFR 0.87), 85% stenosis of OM3, and otherwise nonobstructive disease.  Continued medical therapy was recommended at that time.  Last ischemic evaluation was a Myoview in 05/2018 which showed no reversible ischemia.  She was last seen by Dr. Tamala Julian in 06/2020 at which time she noted occasional palpitations but was otherwise doing well from a cardiac standpoint with no chest pain.  Patient presented to the ED early this morning shortly after midnight for further evaluation of chest pain.  Patient is a very functional and healthy 88 year old who has been taking care of her husband recently who has significant back issues. She reports she was in her usual state of health until last night when she laid down to go to bed.  Shortly before midnight, she developed sudden onset of substernal chest pain that she described as an aching sensation that radiated up to her jaw.  She states this felt different  than the pain she had prior to her PCI in 2018 but given her history she was still concerned.  She took 2 doses of sublingual nitroglycerin with no improvement so then she woke up her husband who called 911.  Before EMS arrived, she took 325 mg of aspirin but pain persisted.  She denies any other associated symptoms with this.  No shortness of breath, diaphoresis, nausea, vomiting.  She denies any other recent chest pain.  No orthopnea, PND, or significant lower extremity edema.  She does have a history of atrial fibrillation and occasionally has palpitations which always seem to occur at night.  However, she states they will go away after taking some slow deep breaths.  No lightheadedness, dizziness, syncope.  She had an upper respiratory infection about 2 weeks ago but this has resolved.  She still has a mild lingering cough and postnasal drip but is otherwise feeling well.  No GI symptoms.  No abnormal bleeding.  She is compliant with all of her medications and last took Eliquis on the evening of 07/20/2021.  In the ED, vitals stable.  He showed rate controlled atrial fibrillation with very slight ST depression in leads V4-V6.  Initial high-sensitivity troponin negative at 7 but repeat slightly elevated at 24.  Chest x-ray showed no acute findings. WBC 7.1, Hgb 13.7, Plts 203. Na 139, K 3.9, Glucose 118, BUN 20, Cr 1.00. Albumin slightly low at 3.4 but otherwise LFTs normal. Lipase normal. Magnesium 1.9.  Respiratory panel negative for COVID and influenza A/B.  She was still having chest pain on arrival to the ED states that eventually resolved around 3:30 AM.  Chest pain lasted for total of 3 to 4 hours.  She has also spontaneously converted back to sinus rhythm while in the ED.  Past Medical History:  Diagnosis Date   A-fib ([place])    Anemia    Arthritis    "a little in my hands" (08/13/2017)   Blepharitis    chronic   CAD (coronary artery disease), native coronary artery    10/18 PCI/DES to p/mLAD,  normal EF   Cardiovascular risk factor    23%   Degenerative joint disease (DJD) of lumbar spine    Diverticulosis    Endometriosis    Fibroids    GERD (gastroesophageal reflux disease)    Hematuria, microscopic 19993   Hemorrhoids    Hypercholesterolemia    10 years   Osteopenia 03/2016   start alendrodate   PONV (postoperative nausea and vomiting)    Renal disease    stage2/stage 3    Past Surgical History:  Procedure Laterality Date   APPENDECTOMY     CARDIAC CATHETERIZATION  08/13/2017   CATARACT EXTRACTION W/ INTRAOCULAR LENS  IMPLANT, BILATERAL Bilateral 2001 -  11/2016   "left-right"   CORONARY ANGIOPLASTY WITH STENT PLACEMENT     CORONARY STENT INTERVENTION N/A 03/14/2017   Procedure: CORONARY STENT INTERVENTION;  Surgeon: Belva Crome, MD;  Location: [place] [facility];  Service: Cardiovascular;  Laterality: N/A;   DILATION AND CURETTAGE OF UTERUS     LEFT HEART CATH AND CORONARY ANGIOGRAPHY N/A 03/14/2017   Procedure: LEFT HEART CATH AND CORONARY ANGIOGRAPHY;  Surgeon: Belva Crome, MD;  Location: [facility];  Service: Cardiovascular;  Laterality: N/A;   LEFT HEART CATH AND CORONARY ANGIOGRAPHY N/A 08/13/2017   Procedure: LEFT HEART CATH AND CORONARY ANGIOGRAPHY;  Surgeon: Belva Crome, MD;  Location: [place];  Service: Cardiovascular;  Laterality: N/A;   OVARIAN CYST SURGERY Bilateral    SALPINGOOPHORECTOMY Bilateral    endometriosis; "still have a piece of my left ovary"   TONSILLECTOMY       Medications Prior to Admission: Prior to Admission medications   Medication Sig Start Date End Date Taking? Authorizing Provider  alendronate (FOSAMAX) 70 MG tablet Take 70 mg by mouth every Saturday. Take with a full glass of water on an empty stomach.   Yes [provider]  apixaban (ELIQUIS) 5 MG TABS tablet Take 1 tablet (5 mg total) by mouth 2 (two) times daily. 06/03/18  Yes Ghimire, Henreitta Leber, MD  Artificial Tear Ointment (ARTIFICIAL  TEARS) ointment Place 1 drop into both eyes 4 (four) times daily as needed (dry eyes). Refresh Tears for dry eyes.   Yes [provider]  atorvastatin (LIPITOR) 80 MG tablet TAKE 1 TABLET BY MOUTH DAILY AT 6 PM Patient taking differently: Take 80 mg by mouth daily. TAKE 1 TABLET BY MOUTH DAILY AT 6 PM 06/07/21  Yes Belva Crome, MD  calcium carbonate (TUMS EX) 750 MG chewable tablet Chew 1-2 tablets by mouth 2 (two) times daily as needed (for hearburn/indigestion.).    Yes [provider]  cholecalciferol (VITAMIN D) 1000 units tablet Take 1,000 Units by mouth daily.   Yes [provider]  isosorbide mononitrate (IMDUR) 60 MG 24 hr tablet TAKE 1 TABLET(60 MG) BY MOUTH DAILY Patient taking differently: Take 60 mg by mouth  daily. 06/07/21  Yes Belva Crome, MD  metoprolol succinate (TOPROL-XL) 50 MG 24 hr tablet TAKE 1 TABLET BY MOUTH DAILY WITH OR IMMEDIATELY FOLLOWING A MEAL Patient taking differently: Take 50 mg by mouth daily. TAKE 1 TABLET BY MOUTH DAILY WITH OR IMMEDIATELY FOLLOWING A MEAL 06/07/21  Yes Belva Crome, MD  metroNIDAZOLE (METROCREAM) 0.75 % cream Apply 1 application topically 2 (two) times daily. roseca 10/01/18  Yes [provider]  nitroGLYCERIN (NITROSTAT) 0.4 MG SL tablet DISSOLVE 1 TABLET UNDER THE TONGUE EVERY 5 MINUTES AS NEEDED FOR CHEST PAIN Patient taking differently: Place 0.4 mg under the tongue every 5 (five) minutes as needed for chest pain. 07/14/21  Yes Belva Crome, MD  tobramycin-dexamethasone [facility]) ophthalmic ointment Place 1 application into both eyes See admin instructions. Tuesday AND Saturday patient does not use. Will apply at night on all other nights.   Yes [provider]     Allergies:    Allergies  Allergen Reactions   Sulfa Antibiotics Other (See Comments)    HEMATURIA   Novocain [Procaine] Palpitations   Penicillins Rash    Has patient had a PCN reaction causing immediate rash,  facial/tongue/throat swelling, SOB or lightheadedness with hypotension: Yes Has patient had a PCN reaction causing severe rash involving mucus membranes or skin necrosis: Unknown Has patient had a PCN reaction that required hospitalization: No Has patient had a PCN reaction occurring within the last 10 years: No If all of the above answers are "NO", then may proceed with Cephalosporin use.     Social History:   Social History   Socioeconomic History   Marital status: Married    Spouse name: Not on file   Number of children: Not on file   Years of education: Not on file   Highest education level: Not on file  Occupational History   Not on file  Tobacco Use   Smoking status: Former    Packs/day: 0.10    Years: 5.00    Pack years: 0.50    Types: Cigarettes    Quit date: 02/06/1969    Years since quitting: 52.4   Smokeless tobacco: Never  Vaping Use   Vaping Use: Never used  Substance and Sexual Activity   Alcohol use: No   Drug use: No   Sexual activity: Not on file    Comment: MARRIED  Other Topics Concern   Not on file  Social History Narrative   Not on file   Social Determinants of Health   Financial Resource Strain: Not on file  Food Insecurity: Not on file  Transportation Needs: Not on file  Physical Activity: Not on file  Stress: Not on file  Social Connections: Not on file  Intimate Partner Violence: Not on file    Family History:   The patient's family history includes Alzheimer's disease in her father; Heart attack in her mother; Hypertension in her father; Other (age of onset: 82) in her sister; Stroke in her father.    ROS:  Please see the history of present illness.  Review of Systems  Constitutional:  Negative for chills and fever.  HENT:  Congestion: postnasal drip.   Respiratory:  Positive for cough and sputum production. Negative for hemoptysis and shortness of breath.   Cardiovascular:  Positive for chest pain and palpitations. Negative for  orthopnea, leg swelling and PND.  Gastrointestinal:  Negative for abdominal pain, blood in stool, nausea and vomiting.  Genitourinary:  Negative for hematuria.  Musculoskeletal:  Negative for myalgias.  Neurological:  Negative for dizziness and loss of consciousness.  Endo/Heme/Allergies:  Does not bruise/bleed easily.  Psychiatric/Behavioral:  Substance abuse: remote smoking history.     Physical Exam/Data:   Vitals:   07/21/21 0315 07/21/21 0415 07/21/21 0600 07/21/21 0645  BP: 140/87 (!) 110/59 (!) 102/46 115/60  Pulse: (!) 48 60 60 67  Resp: 13 (!) 22 (!) 28 18  Temp:      TempSrc:      SpO2: 95% 97% 93% 93%   No intake or output data in the 24 hours ending 07/21/21 0749 Last 3 Weights 06/24/2020 05/20/2019 02/09/2019  Weight (lbs) 134 lb 9.6 oz 137 lb 12.8 oz 137 lb 12.8 oz  Weight (kg) 61.054 kg 62.506 kg 62.506 kg     There is no height or weight on file to calculate BMI.   General: 88 y.o. Caucasian female resting comfortably in no acute distress. HEENT: Normocephalic and atraumatic. Sclera clear.  Neck: Supple. No carotid bruits. No JVD. Heart: RRR. Distinct S1 and S2. No murmurs, gallops, or rubs. Radial pedal pulses 2+ and equal bilaterally. Lungs: No increased work of breathing. Clear to ausculation bilaterally. No wheezes, rhonchi, or rales.  Abdomen: Soft, non-distended, and non-tender to palpation. Bowel sounds present. Extremities: Trace lower extremity edema.    Skin: Warm and dry. Neuro: Alert and oriented x3. No focal deficits. Psych: Normal affect. Responds appropriately.  EKG:  The ECG that was done was personally reviewed and demonstrates atrial fibrillation, rate 84 bpm, with slight ST depression in leads V4-V6.  Normal axis.  QTc 457 ms.  Relevant CV Studies:  Left Cardiac Catheterization 08/13/2017: Ost 3rd Mrg lesion is 85% stenosed.   Left main without evidence of significant obstruction [name] contains mid and distal vessel significant tortuosity  before wrapping around the left ventricular apex.  The previously placed proximal to mid [name] stent is widely patent.  The first dominant diagonal contains eccentric ostial to proximal 60-70% narrowing.  The [name] beyond the diagonal is calcified and contains eccentric 50-70% narrowing. Circumflex contains proximal and mid luminal irregularities.  Distally before the origin of the dominant obtuse marginal branch there is 50% narrowing.  The continuation of the circumflex beyond the second obtuse marginal contains ostial 90% narrowing.  This is unchanged from the prior angiogram. The right coronary contains 30-40% ostial narrowing.  No catheter dampening is noted.  The vessel contains tortuosity.  The mid vessel after the acute marginal branch contains 50-70% narrowing.  This is unchanged from prior.  The PDA contains diffuse narrowing from proximal to mid segment 50-70%. FFR of the mid [name] lesion was 0.87.  No interventional therapy was performed on this occasion. Generally speaking, the coronary is a small in caliber representing diffuse atherosclerosis.   Recommendations: Medical therapy.  Perhaps further up titration of isosorbide therapy. Risk factor modification. Diagnostic Dominance: Co-dominant    Laboratory Data:  High Sensitivity Troponin:   Recent Labs  Lab 07/21/21 0130 07/21/21 0440  TROPONINIHS 7 24*      Chemistry Recent Labs  Lab 07/21/21 0130  NA 139  K 3.9  CL 104  CO2 26  GLUCOSE 118*  BUN 20  CREATININE 1.00  CALCIUM 9.2  MG 1.9  GFRNONAA 55*  ANIONGAP 9    Recent Labs  Lab 07/21/21 0130  PROT 6.4*  ALBUMIN 3.4*  AST 25  ALT 25  ALKPHOS 92  BILITOT 0.6   Lipids No results for input(s): CHOL, TRIG, HDL, LABVLDL,  LDLCALC, CHOLHDL in the last 168 hours. Hematology Recent Labs  Lab 07/21/21 0130  WBC 7.1  RBC 4.37  HGB 13.7  HCT 41.5  MCV 95.0  MCH 31.4  MCHC 33.0  RDW 13.6  PLT 203   Thyroid No results for input(s): TSH, FREET4 in the last  168 hours. BNPNo results for input(s): BNP, PROBNP in the last 168 hours.  DDimer No results for input(s): DDIMER in the last 168 hours.   Radiology/Studies:  DG Chest 2 View  Result Date: 07/21/2021 CLINICAL DATA:  Chest pain EXAM: CHEST - 2 VIEW COMPARISON:  06/02/2018 FINDINGS: Lungs are well expanded, symmetric, and clear. No pneumothorax or pleural effusion. Cardiac size within normal limits. Pulmonary vascularity is normal. Osseous structures are age-appropriate. No acute bone abnormality. IMPRESSION: No active cardiopulmonary disease. Electronically Signed   By: Fidela Salisbury M.D.   On: 07/21/2021 01:53     Assessment and Plan:   Chest Pain History of CAD Patient has a history of CAD with prior stenting to [name] in 2018.  He now reports presents with sudden onset of chest pain at rest that radiated to her jaw and lasted for about 3 to 4 hours.  Did not resolve after 2 doses of sublingual nitro.  EKG shows very slight ST depressions in leads V4-V6.  Initial high sensitive troponin negative but repeat slightly positive at 24. - Patient currently chest pain free. - We will repeat troponin.   - Will check Echo. - Will start IV Heparin. - She has already received 324 mg of Aspirin.  Continue home Toprol-XL 50, Imdur 60mg  daily, and Lipitor 80mg  daily. - Patient will need repeat cardiac catheterization.  Her last dose of Eliquis was last night but she may be possible candidate for her last case today.  Will discuss with MD.  The patient understands that risks include but are not limited to stroke (1 in 1000), death (1 in 1000), kidney failure [usually temporary] (1 in 500), bleeding (1 in 200), allergic reaction [possibly serious] (1 in 200), and agrees to proceed.    Paroxysmal Atrial Fibrillation Patient presented in atrial fibrillation with rates as high as the 120s but spontaneously converted back to sinus rhythm with rates in the 60s while in the ED. - Continue home Toprol 50 mg  daily. - On Eliquis at home.  Last dose was last night.  Will hold in anticipation for cardiac catheterization.  Hyperlipidemia No recent lipid panel in our system. - Will check fasting fasting lipid panel. - Continue Lipitor 80mg  daily.   Risk Assessment/Risk Scores:   TIMI Risk Score for Unstable Angina or Non-ST Elevation MI:   The patient's TIMI risk score is 4, which indicates a 20% risk of all cause mortality, new or recurrent myocardial infarction or need for urgent revascularization in the next 14 days.{   CHA2DS2-VASc Score = 4  This indicates a 4.8% annual risk of stroke. The patient's score is based upon: CHF History: 0 HTN History: 0 Diabetes History: 0 Stroke History: 0 Vascular Disease History: 1 Age Score: 2 Gender Score: 1   Severity of Illness: The appropriate patient status for this patient is OBSERVATION. Observation status is judged to be reasonable and necessary in order to provide the required intensity of service to ensure the patient's safety. The patient's presenting symptoms, physical exam findings, and initial radiographic and laboratory data in the context of their medical condition is felt to place them at decreased risk for further clinical deterioration. Furthermore,  it is anticipated that the patient will be medically stable for discharge from the hospital within 2 midnights of admission.    For questions or updates, please contact [place] Please consult [email or web address] for contact info under     Signed, Eppie Gibson  07/21/2021 7:49 AM   Attending Note:   The patient was seen and examined.  Agree with assessment and plan as noted above.  Changes made to the above note as needed.  Patient seen and independently examined with Sande Rives, PA .   We discussed all aspects of the encounter. I agree with the assessment and plan as stated above.   1.  Unstable angina: Sherry Barnett presents with symptoms consistent with unstable  angina.  She woke up last night with severe substernal chest pressure.  The discomfort radiated to her neck.  She tried several of her new nitroglycerin tablets but they did not relieve the chest pain.  She presented to the emergency room and the pain eventually went away approximately 3 hours later.  At presentation she had mild ST segment depression in V4 through V6.  She was in atrial fibrillation at that time but her rate was not all that fast so I do not necessarily think that these were rate related. Troponins have increased from 7-24.  Another troponin has been ordered later this morning.  She has a history of coronary stenting in the past.  I think that our next best plan is to proceed with coronary angiography. I discussed the procedure with the patient and her daughter.  We have discussed the risk, benefits, options of heart catheterization.  She understands and agrees to proceed.   2.  PAF:   is back in sinus. Last dose of Eliquis was last night.  We will hold her Eliquis for now.  She is on heparin.  Dr. Burt Knack has agreed to do the heart catheterization later this afternoon.  We will give her a clear liquid breakfast.  3.  Hyperlipidemia: Continue current medications.   I have spent a total of 40 minutes with patient reviewing hospital  notes , telemetry, EKGs, labs and examining patient as well as establishing an assessment and plan that was discussed with the patient.  > 50% of time was spent in direct patient care.    Thayer Headings, Brooke Bonito., MD, [facility] 07/21/2021, 8:58 AM [address]  [place] Pager [phone]

## 2021-07-21 NOTE — Progress Notes (Signed)
ANTICOAGULATION CONSULT NOTE - Initial Consult  Pharmacy Consult for Heparin Indication: chest pain/ACS  Allergies  Allergen Reactions   Sulfa Antibiotics Other (See Comments)    HEMATURIA   Novocain [Procaine] Palpitations   Penicillins Rash    Has patient had a PCN reaction causing immediate rash, facial/tongue/throat swelling, SOB or lightheadedness with hypotension: Yes Has patient had a PCN reaction causing severe rash involving mucus membranes or skin necrosis: Unknown Has patient had a PCN reaction that required hospitalization: No Has patient had a PCN reaction occurring within the last 10 years: No If all of the above answers are "NO", then may proceed with Cephalosporin use.     Patient Measurements:   Heparin Dosing Weight: 60 kg  Vital Signs: Temp: 97.4 F (36.3 C) (02/10 0053) Temp Source: Oral (02/10 0053) BP: 102/46 (02/10 0600) Pulse Rate: 60 (02/10 0600)  Labs: Recent Labs    07/21/21 0130 07/21/21 0440  HGB 13.7  --   HCT 41.5  --   PLT 203  --   CREATININE 1.00  --   TROPONINIHS 7 24*    CrCl cannot be calculated (Unknown ideal weight.).   Medical History: Past Medical History:  Diagnosis Date   A-fib ([place])    Anemia    Arthritis    "a little in my hands" (08/13/2017)   Blepharitis    chronic   CAD (coronary artery disease), native coronary artery    10/18 PCI/DES to p/mLAD, normal EF   Cardiovascular risk factor    23%   Degenerative joint disease (DJD) of lumbar spine    Diverticulosis    Endometriosis    Fibroids    GERD (gastroesophageal reflux disease)    Hematuria, microscopic 12878   Hemorrhoids    Hypercholesterolemia    10 years   Osteopenia 03/2016   start alendrodate   PONV (postoperative nausea and vomiting)    Renal disease    stage2/stage 3    Medications:  No current facility-administered medications on file prior to encounter.   Current Outpatient Medications on File Prior to Encounter  Medication Sig  Dispense Refill   alendronate (FOSAMAX) 70 MG tablet Take 70 mg by mouth every Saturday. Take with a full glass of water on an empty stomach.     apixaban (ELIQUIS) 5 MG TABS tablet Take 1 tablet (5 mg total) by mouth 2 (two) times daily. 60 tablet 1   Artificial Tear Ointment (ARTIFICIAL TEARS) ointment Place 1 drop into both eyes 4 (four) times daily as needed (dry eyes). Refresh Tears for dry eyes.     atorvastatin (LIPITOR) 80 MG tablet TAKE 1 TABLET BY MOUTH DAILY AT 6 PM (Patient taking differently: Take 80 mg by mouth daily. TAKE 1 TABLET BY MOUTH DAILY AT 6 PM) 90 tablet 0   calcium carbonate (TUMS EX) 750 MG chewable tablet Chew 1-2 tablets by mouth 2 (two) times daily as needed (for hearburn/indigestion.).      cholecalciferol (VITAMIN D) 1000 units tablet Take 1,000 Units by mouth daily.     isosorbide mononitrate (IMDUR) 60 MG 24 hr tablet TAKE 1 TABLET(60 MG) BY MOUTH DAILY (Patient taking differently: Take 60 mg by mouth daily.) 90 tablet 0   metoprolol succinate (TOPROL-XL) 50 MG 24 hr tablet TAKE 1 TABLET BY MOUTH DAILY WITH OR IMMEDIATELY FOLLOWING A MEAL (Patient taking differently: Take 50 mg by mouth daily. TAKE 1 TABLET BY MOUTH DAILY WITH OR IMMEDIATELY FOLLOWING A MEAL) 90 tablet 0   metroNIDAZOLE (  METROCREAM) 0.75 % cream Apply 1 application topically 2 (two) times daily. roseca     nitroGLYCERIN (NITROSTAT) 0.4 MG SL tablet DISSOLVE 1 TABLET UNDER THE TONGUE EVERY 5 MINUTES AS NEEDED FOR CHEST PAIN (Patient taking differently: Place 0.4 mg under the tongue every 5 (five) minutes as needed for chest pain.) 25 tablet 1   tobramycin-dexamethasone (TOBRADEX) ophthalmic ointment Place 1 application into both eyes See admin instructions. Tuesday AND Saturday patient does not use. Will apply at night on all other nights.       Assessment: 88 y.o. female admitted with chest pain, h/o Afib and Eliquis on hold, for heparin.  Last dose of Eliquis ~ 8 pm 2/9 Goal of Therapy:  aPTT  66-102 sec Heparin level 0.3-0.7 units/ml Monitor platelets by anticoagulation protocol: Yes   Plan:  Start heparin 800 units/hr aPTT in 8 hours  Caryl Pina 07/21/2021,6:22 AM

## 2021-07-21 NOTE — ED Provider Notes (Signed)
[facility] EMERGENCY DEPARTMENT Provider Note   CSN: [id number] Arrival date & time: 07/21/21  0050     History  Chief Complaint  Patient presents with   Chest Pain    Sherry Barnett is a 88 y.o. female.   Chest Pain Pain location:  Substernal area and L chest Pain quality: aching and pressure   Pain severity:  Moderate Onset quality:  Sudden Duration:  1 hour Timing:  Constant Progression:  Improving Chronicity:  New Context: not breathing   Relieved by:  None tried Worsened by:  Nothing Ineffective treatments:  None tried Associated symptoms: no fatigue, no numbness and no shortness of breath       Home Medications Prior to Admission medications   Medication Sig Start Date End Date Taking? Authorizing Provider  alendronate (FOSAMAX) 70 MG tablet Take 70 mg by mouth every Saturday. Take with a full glass of water on an empty stomach.   Yes [provider]  apixaban (ELIQUIS) 5 MG TABS tablet Take 1 tablet (5 mg total) by mouth 2 (two) times daily. 06/03/18  Yes Ghimire, Henreitta Leber, MD  Artificial Tear Ointment (ARTIFICIAL TEARS) ointment Place 1 drop into both eyes 4 (four) times daily as needed (dry eyes). Refresh Tears for dry eyes.   Yes [provider]  atorvastatin (LIPITOR) 80 MG tablet TAKE 1 TABLET BY MOUTH DAILY AT 6 PM Patient taking differently: Take 80 mg by mouth daily. TAKE 1 TABLET BY MOUTH DAILY AT 6 PM 06/07/21  Yes Belva Crome, MD  calcium carbonate (TUMS EX) 750 MG chewable tablet Chew 1-2 tablets by mouth 2 (two) times daily as needed (for hearburn/indigestion.).    Yes [provider]  cholecalciferol (VITAMIN D) 1000 units tablet Take 1,000 Units by mouth daily.   Yes [provider]  isosorbide mononitrate (IMDUR) 60 MG 24 hr tablet TAKE 1 TABLET(60 MG) BY MOUTH DAILY Patient taking differently: Take 60 mg by mouth daily. 06/07/21  Yes Belva Crome, MD  metoprolol succinate (TOPROL-XL) 50  MG 24 hr tablet TAKE 1 TABLET BY MOUTH DAILY WITH OR IMMEDIATELY FOLLOWING A MEAL Patient taking differently: Take 50 mg by mouth daily. TAKE 1 TABLET BY MOUTH DAILY WITH OR IMMEDIATELY FOLLOWING A MEAL 06/07/21  Yes Belva Crome, MD  metroNIDAZOLE (METROCREAM) 0.75 % cream Apply 1 application topically 2 (two) times daily. roseca 10/01/18  Yes [provider]  nitroGLYCERIN (NITROSTAT) 0.4 MG SL tablet DISSOLVE 1 TABLET UNDER THE TONGUE EVERY 5 MINUTES AS NEEDED FOR CHEST PAIN Patient taking differently: Place 0.4 mg under the tongue every 5 (five) minutes as needed for chest pain. 07/14/21  Yes Belva Crome, MD  tobramycin-dexamethasone [facility]) ophthalmic ointment Place 1 application into both eyes See admin instructions. Tuesday AND Saturday patient does not use. Will apply at night on all other nights.   Yes [provider]      Allergies    Sulfa antibiotics, Novocain [procaine], and Penicillins    Review of Systems   Review of Systems  Constitutional:  Negative for fatigue.  Respiratory:  Negative for shortness of breath.   Cardiovascular:  Positive for chest pain.  Neurological:  Negative for numbness.   Physical Exam Updated Vital Signs BP 115/60    Pulse 67    Temp (!) 97.4 F (36.3 C) (Oral)    Resp 18    SpO2 93%  Physical Exam Vitals and nursing note reviewed.  Constitutional:  Appearance: She is well-developed.  HENT:     Head: Normocephalic and atraumatic.  Cardiovascular:     Rate and Rhythm: Normal rate and regular rhythm.  Pulmonary:     Effort: No respiratory distress.     Breath sounds: Normal breath sounds. No stridor.  Chest:     Chest wall: No mass or deformity.  Abdominal:     General: There is no distension or abdominal bruit.     Palpations: Abdomen is soft.  Musculoskeletal:        General: Normal range of motion.     Cervical back: Normal range of motion.  Skin:    General: Skin is warm and dry.  Neurological:     Mental  Status: She is alert.    ED Results / Procedures / Treatments   Labs (all labs ordered are listed, but only abnormal results are displayed) Labs Reviewed  COMPREHENSIVE METABOLIC PANEL - Abnormal; Notable for the following components:      Result Value   Glucose, Bld 118 (*)    Total Protein 6.4 (*)    Albumin 3.4 (*)    GFR, Estimated 55 (*)    All other components within normal limits  CBG MONITORING, ED - Abnormal; Notable for the following components:   Glucose-Capillary 102 (*)    All other components within normal limits  TROPONIN I (HIGH SENSITIVITY) - Abnormal; Notable for the following components:   Troponin I (High Sensitivity) 24 (*)    All other components within normal limits  RESP PANEL BY RT-PCR (FLU A&B, COVID) ARPGX2  CBC  LIPASE, BLOOD  MAGNESIUM  APTT  HEPARIN LEVEL (UNFRACTIONATED)  TROPONIN I (HIGH SENSITIVITY)    EKG EKG Interpretation  Date/Time:  Friday July 21 2021 00:55:37 EST Ventricular Rate:  84 PR Interval:    QRS Duration: 96 QT Interval:  386 QTC Calculation: 457 R Axis:   28 Text Interpretation: Atrial fibrillation Low voltage, extremity and precordial leads Confirmed by Merrily Pew [phone]) on 07/21/2021 2:11:59 AM  Radiology DG Chest 2 View  Result Date: 07/21/2021 CLINICAL DATA:  Chest pain EXAM: CHEST - 2 VIEW COMPARISON:  06/02/2018 FINDINGS: Lungs are well expanded, symmetric, and clear. No pneumothorax or pleural effusion. Cardiac size within normal limits. Pulmonary vascularity is normal. Osseous structures are age-appropriate. No acute bone abnormality. IMPRESSION: No active cardiopulmonary disease. Electronically Signed   By: Fidela Salisbury M.D.   On: 07/21/2021 01:53    Procedures .Critical Care Performed by: Merrily Pew, MD Authorized by: Merrily Pew, MD   Critical care provider statement:    Critical care time (minutes):  32   Critical care time was exclusive of:  Separately billable procedures and treating  other patients and teaching time   Critical care was necessary to treat or prevent imminent or life-threatening deterioration of the following conditions:  Cardiac failure   Critical care was time spent personally by me on the following activities:  Development of treatment plan with patient or surrogate, evaluation of patient's response to treatment, examination of patient, obtaining history from patient or surrogate, review of old charts, re-evaluation of patient's condition, pulse oximetry, ordering and performing treatments and interventions and ordering and review of laboratory studies    Medications Ordered in ED Medications  aspirin chewable tablet 324 mg (324 mg Oral Not Given 07/21/21 0131)  nitroGLYCERIN (NITROSTAT) SL tablet 0.4 mg (has no administration in time range)  nitroGLYCERIN 50 mg in dextrose 5 % 250 mL (0.2 mg/mL) infusion (has  no administration in time range)  heparin ADULT infusion 100 units/mL (25000 units/238mL) (800 Units/hr Intravenous New Bag/Given 07/21/21 0758)    ED Course/ Medical Decision Making/ A&P                           Medical Decision Making Amount and/or Complexity of Data Reviewed Labs: ordered. Radiology: ordered.  Risk OTC drugs. Prescription drug management. Decision regarding hospitalization.   Patient with a strong cardiac history to include a stent laidback when most recently had a catheterization back in 2019 that showed a patent stent but nonobstructive disease elsewhere.  She had an event tonight where she woke up with chest pressure feel like her previous cardiac events and presented here.  She tried nitro before, did not help.  She did take aspirin prior to arrival as well.  Attempted to give her nitroglycerin here but by the time we got something to give to her her chest pain had resolved.  She has some minor EKG changes but nothing too concerning.  Second troponin was rising so cardiology elected to take to the Cath Lab.  Heparin  started and nitroglycerin started.    Final Clinical Impression(s) / ED Diagnoses Final diagnoses:  Nonspecific chest pain  NSTEMI (non-ST elevated myocardial infarction) [facility])    Rx / DC Orders ED Discharge Orders     None         Marlee Trentman, Corene Cornea, MD 07/21/21 [phone]

## 2021-07-21 NOTE — Interval H&P Note (Signed)
Cath Lab Visit (complete for each Cath Lab visit)  Clinical Evaluation Leading to the Procedure:   ACS: Yes.    Non-ACS:    Anginal Classification: CCS III  Anti-ischemic medical therapy: Maximal Therapy (2 or more classes of medications)  Non-Invasive Test Results: No non-invasive testing performed  Prior CABG: No previous CABG      History and Physical Interval Note:  07/21/2021 3:45 PM  Sherry Barnett  has presented today for surgery, with the diagnosis of nstemi.  The various methods of treatment have been discussed with the patient and family. After consideration of risks, benefits and other options for treatment, the patient has consented to  Procedure(s): LEFT HEART CATH AND CORONARY ANGIOGRAPHY (N/A) as a surgical intervention.  The patient's history has been reviewed, patient examined, no change in status, stable for surgery.  I have reviewed the patient's chart and labs.  Questions were answered to the patient's satisfaction.     [name]

## 2021-07-22 ENCOUNTER — Other Ambulatory Visit: Payer: Self-pay | Admitting: Cardiology

## 2021-07-22 ENCOUNTER — Observation Stay (HOSPITAL_BASED_OUTPATIENT_CLINIC_OR_DEPARTMENT_OTHER): Payer: Medicare Other

## 2021-07-22 DIAGNOSIS — I2510 Atherosclerotic heart disease of native coronary artery without angina pectoris: Secondary | ICD-10-CM | POA: Diagnosis not present

## 2021-07-22 DIAGNOSIS — I25110 Atherosclerotic heart disease of native coronary artery with unstable angina pectoris: Secondary | ICD-10-CM | POA: Diagnosis not present

## 2021-07-22 DIAGNOSIS — R079 Chest pain, unspecified: Secondary | ICD-10-CM | POA: Diagnosis not present

## 2021-07-22 DIAGNOSIS — E7800 Pure hypercholesterolemia, unspecified: Secondary | ICD-10-CM | POA: Diagnosis not present

## 2021-07-22 DIAGNOSIS — I480 Paroxysmal atrial fibrillation: Secondary | ICD-10-CM

## 2021-07-22 DIAGNOSIS — Z20822 Contact with and (suspected) exposure to covid-19: Secondary | ICD-10-CM | POA: Diagnosis not present

## 2021-07-22 DIAGNOSIS — Z7901 Long term (current) use of anticoagulants: Secondary | ICD-10-CM | POA: Diagnosis not present

## 2021-07-22 DIAGNOSIS — N183 Chronic kidney disease, stage 3 unspecified: Secondary | ICD-10-CM | POA: Diagnosis not present

## 2021-07-22 DIAGNOSIS — I200 Unstable angina: Secondary | ICD-10-CM | POA: Diagnosis not present

## 2021-07-22 DIAGNOSIS — I2583 Coronary atherosclerosis due to lipid rich plaque: Secondary | ICD-10-CM

## 2021-07-22 LAB — BASIC METABOLIC PANEL
Anion gap: 8 (ref 5–15)
BUN: 19 mg/dL (ref 8–23)
CO2: 25 mmol/L (ref 22–32)
Calcium: 8.5 mg/dL — ABNORMAL LOW (ref 8.9–10.3)
Chloride: 106 mmol/L (ref 98–111)
Creatinine, Ser: 0.85 mg/dL (ref 0.44–1.00)
GFR, Estimated: >60 mL/min (ref >60)
Glucose, Bld: 85 mg/dL (ref 70–99)
Potassium: 3.7 mmol/L (ref 3.5–5.1)
Sodium: 139 mmol/L (ref 135–145)

## 2021-07-22 LAB — CBC
HCT: 34.6 % — ABNORMAL LOW (ref 36.0–46.0)
Hemoglobin: 11.3 g/dL — ABNORMAL LOW (ref 12.0–15.0)
MCH: 31 pg (ref 26.0–34.0)
MCHC: 32.7 g/dL (ref 30.0–36.0)
MCV: 94.8 fL (ref 80.0–100.0)
Platelets: 182 10*3/uL (ref 150–400)
RBC: 3.65 MIL/uL — ABNORMAL LOW (ref 3.87–5.11)
RDW: 13.8 % (ref 11.5–15.5)
WBC: 7.5 10*3/uL (ref 4.0–10.5)
nRBC: 0 % (ref 0.0–0.2)

## 2021-07-22 LAB — ECHOCARDIOGRAM COMPLETE
Area-P 1/2: 3.85 cm2
Height: 62 in
S' Lateral: 2.2 cm
Weight: 2048 oz

## 2021-07-22 MED ORDER — APIXABAN 2.5 MG PO TABS
2.5000 mg | ORAL_TABLET | Freq: Two times a day (BID) | ORAL | 4 refills | Status: DC
Start: 2021-07-22 — End: 2024-04-14

## 2021-07-22 MED ORDER — ACETAMINOPHEN 325 MG PO TABS
650.0000 mg | ORAL_TABLET | ORAL | Status: AC | PRN
Start: 2021-07-22 — End: ?

## 2021-07-22 NOTE — Progress Notes (Signed)

## 2021-07-22 NOTE — Progress Notes (Signed)
CARDIAC REHAB PHASE I   PRE:  Rate/Rhythm: 75 SR  BP:  Supine:   Sitting: 104/76  Standing:    SaO2: 98% RA  MODE:  Ambulation: 280 ft   POST:  Rate/Rhythm: 83 SR  BP:  Supine:   Sitting: 125/57  Standing:    SaO2: 98% RA  1102-1117 Patient ambulated 280 ft with assist x1. Gait slow, steady, tired at the end of walk. Denies CP, denies SOB. To bed after walk, vital signs within normal limits. Reviewed restrictions, risk factors, CP, NTG use, and calling 911, heart healthy diet and activity progression. Handouts given. Discussed phase 2 cardiac rehab, and patient has participated in the program before. Patient doesn't think she will repeat the program, but she will let schedulers know when they call. Referral placed for program at [facility].  Sol Passer, MS, ACSM CEP

## 2021-07-22 NOTE — Discharge Instructions (Signed)
Call [facility] at [phone] if any bleeding, swelling or drainage at cath site.  May shower, no tub baths for 48 hours for groin sticks. No lifting over 5 pounds for 3 days.  No Driving for 3 days if you drive.  Heart healthy diet  Our office will contact you about wearing a monitor to determine how much atrial fib you have.    Call the office if any issues.

## 2021-07-22 NOTE — Discharge Summary (Signed)
Discharge Summary    Patient ID: Sherry Barnett MRN: [id number]; DOB: 1933-05-03  Admit date: 07/21/2021 Discharge date: 07/22/2021  PCP:  Lajean Manes, MD   [facility] Providers Cardiologist:  Sinclair Grooms, MD        Discharge Diagnoses    Principal Problem:   Unstable angina [facility]) Active Problems:   HLD (hyperlipidemia)   Paroxysmal atrial fibrillation with RVR ([place])   Coronary artery disease involving native coronary artery of native heart with angina pectoris [facility])    Diagnostic Studies/Procedures    Cardiac cath 07/21/21    Prox [name] to Mid [name] lesion is 50% stenosed.   Ost Cx to Prox Cx lesion is 30% stenosed.   Ost RCA lesion is 50% stenosed.   Ost 2nd Diag to 2nd Diag lesion is 40% stenosed.   Ost 3rd Mrg lesion is 30% stenosed.   Dist LM to Prox [name] lesion is 10% stenosed.   1.  Patent left main with no significant stenosis 2.  Patent proximal [name] stent with no significant in-stent restenosis.  Mild nonobstructive disease throughout the remainder of the [name] 3.  Patent left circumflex with mild diffuse plaquing but no significant stenoses 4.  Mild to moderate ostial and mid RCA stenoses without any high-grade obstruction 5.  Normal LVEDP   Recommend: Medical therapy for nonobstructive CAD.  Resume apixaban tomorrow morning.  Symptoms possibly related to paroxysmal atrial fibrillation with RVR.  Diagnostic Dominance: Co-dominant  _____________  Echo  07/22/21 IMPRESSIONS     1. Left ventricular ejection fraction, by estimation, is 60 to 65%. The  left ventricle has normal function. The left ventricle has no regional  wall motion abnormalities. Left ventricular diastolic parameters were  normal. The average left ventricular  global longitudinal strain is -19.4 %. The global longitudinal strain is  normal.   2. Right ventricular systolic function is normal. The right ventricular  size is normal. There is normal pulmonary artery systolic pressure. The   estimated right ventricular systolic pressure is 05.3 mmHg.   3. The mitral valve is normal in structure. Trivial mitral valve  regurgitation. No evidence of mitral stenosis.   4. The aortic valve is calcified. Aortic valve regurgitation is trivial.  Aortic valve sclerosis/calcification is present, without any evidence of  aortic stenosis.   5. The inferior vena cava is normal in size with greater than 50%  respiratory variability, suggesting right atrial pressure of 3 mmHg.   FINDINGS   Left Ventricle: Left ventricular ejection fraction, by estimation, is 60  to 65%. The left ventricle has normal function. The left ventricle has no  regional wall motion abnormalities. The average left ventricular global  longitudinal strain is -19.4 %.  The global longitudinal strain is normal. The left ventricular internal  cavity size was normal in size. There is no left ventricular hypertrophy.  Left ventricular diastolic parameters were normal. Normal left ventricular  filling pressure.   Right Ventricle: The right ventricular size is normal. No increase in  right ventricular wall thickness. Right ventricular systolic function is  normal. There is normal pulmonary artery systolic pressure. The tricuspid  regurgitant velocity is 2.32 m/s, and   with an assumed right atrial pressure of 3 mmHg, the estimated right  ventricular systolic pressure is 97.6 mmHg.   Left Atrium: Left atrial size was normal in size.   Right Atrium: Right atrial size was normal in size.   Pericardium: There is no evidence of pericardial effusion.   Mitral  Valve: The mitral valve is normal in structure. Trivial mitral  valve regurgitation. No evidence of mitral valve stenosis.   Tricuspid Valve: The tricuspid valve is normal in structure. Tricuspid  valve regurgitation is trivial. No evidence of tricuspid stenosis.   Aortic Valve: The aortic valve is calcified. Aortic valve regurgitation is  trivial. Aortic valve  sclerosis/calcification is present, without any  evidence of aortic stenosis.   Pulmonic Valve: The pulmonic valve was normal in structure. Pulmonic valve  regurgitation is not visualized. No evidence of pulmonic stenosis.   Aorta: The aortic root is normal in size and structure.   Venous: The inferior vena cava is normal in size with greater than 50%  respiratory variability, suggesting right atrial pressure of 3 mmHg.   IAS/Shunts: No atrial level shunt detected by color flow Doppler.       History of Present Illness     Sherry Barnett is a 88 y.o. female with  history of CAD with DES to [name] in 2018, paroxysmal atrial fibrillation on Eliquis, hyperlipidemia, GERD, CKD stage III presented to ER 07/21/21 for chest pain.  She developed sudden onset of substernal chest pain with radiation to jaw, no relief with NTG.  She also took ASA 325 mg.   rate controlled atrial fibrillation with very slight ST depression in leads V4-V6.  Initial high-sensitivity troponin negative at 7 but repeat slightly elevated at 24.  Chest x-ray showed no acute findings. WBC 7.1, Hgb 13.7, Plts 203. Na 139, K 3.9, Glucose 118, BUN 20, Cr 1.00. Albumin slightly low at 3.4 but otherwise LFTs normal. Lipase normal. Magnesium 1.9. Respiratory panel negative for COVID and influenza A/B.  She was still having chest pain on arrival to the ED states that eventually resolved around 3:30 AM.  Chest pain lasted for total of 3 to 4 hours.  She has also spontaneously converted back to sinus rhythm while in the ED.    She was admitted with plan for cardiac cath her Eliquis was held.    Hospital Course     Consultants: none   Cardiac cath showed 50% proximal to mid [name] with patent proximal [name] stent, 30% ostial to proximal left circumflex, 50% ostial RCA, 30% OM 3, 40% diagonal 2 and 10% distal to proximal left main and normal LVEDP.  Medical therapy was recommended.  It was thought that her symptoms may be related to paroxysmal  atrial fibrillation with RVR.Marland Kitchen  no further chest pain.  ASA stopped due to DOAC.  Continue statin, imdur and BB.  Continue eliquis 2.5 BID.  She will need 30 day event monitor eo eval assess AFib burden.    She was seen and evaluated by Dr. Radford Pax and found stable for discharge.       Did the patient have an acute coronary syndrome (MI, NSTEMI, STEMI, etc) this admission?:  No                               Did the patient have a percutaneous coronary intervention (stent / angioplasty)?:  No.          _____________  Discharge Vitals Blood pressure (!) 128/57, pulse 67, temperature 97.7 F (36.5 C), temperature source Oral, resp. rate 17, height 5\' 2"  (1.575 m), weight 58.1 kg, SpO2 98 %.  Filed Weights   07/21/21 0937 07/21/21 1457  Weight: 57.2 kg 58.1 kg    Labs & Radiologic Studies  CBC Recent Labs    07/21/21 0130 07/22/21 0225  WBC 7.1 7.5  HGB 13.7 11.3*  HCT 41.5 34.6*  MCV 95.0 94.8  PLT 203 174   Basic Metabolic Panel Recent Labs    07/21/21 0130 07/22/21 0225  NA 139 139  K 3.9 3.7  CL 104 106  CO2 26 25  GLUCOSE 118* 85  BUN 20 19  CREATININE 1.00 0.85  CALCIUM 9.2 8.5*  MG 1.9  --    Liver Function Tests Recent Labs    07/21/21 0130  AST 25  ALT 25  ALKPHOS 92  BILITOT 0.6  PROT 6.4*  ALBUMIN 3.4*   Recent Labs    07/21/21 0130  LIPASE 50   High Sensitivity Troponin:   Recent Labs  Lab 07/21/21 0130 07/21/21 0440 07/21/21 0910  TROPONINIHS 7 24* 28*    BNP Invalid input(s): POCBNP D-Dimer No results for input(s): DDIMER in the last 72 hours. Hemoglobin A1C Recent Labs    07/21/21 0910  HGBA1C 5.9*   Fasting Lipid Panel Recent Labs    07/21/21 0910  CHOL 118  HDL 55  LDLCALC 55  TRIG 39  CHOLHDL 2.1   Thyroid Function Tests No results for input(s): TSH, T4TOTAL, T3FREE, THYROIDAB in the last 72 hours.  Invalid input(s): FREET3 _____________  DG Chest 2 View  Result Date: 07/21/2021 CLINICAL DATA:  Chest  pain EXAM: CHEST - 2 VIEW COMPARISON:  06/02/2018 FINDINGS: Lungs are well expanded, symmetric, and clear. No pneumothorax or pleural effusion. Cardiac size within normal limits. Pulmonary vascularity is normal. Osseous structures are age-appropriate. No acute bone abnormality. IMPRESSION: No active cardiopulmonary disease. Electronically Signed   By: Fidela Salisbury M.D.   On: 07/21/2021 01:53   CARDIAC CATHETERIZATION  Result Date: 07/21/2021   Prox [name] to Mid [name] lesion is 50% stenosed.   Ost Cx to Prox Cx lesion is 30% stenosed.   Ost RCA lesion is 50% stenosed.   Ost 2nd Diag to 2nd Diag lesion is 40% stenosed.   Ost 3rd Mrg lesion is 30% stenosed.   Dist LM to Prox [name] lesion is 10% stenosed. 1.  Patent left main with no significant stenosis 2.  Patent proximal [name] stent with no significant in-stent restenosis.  Mild nonobstructive disease throughout the remainder of the [name] 3.  Patent left circumflex with mild diffuse plaquing but no significant stenoses 4.  Mild to moderate ostial and mid RCA stenoses without any high-grade obstruction 5.  Normal LVEDP Recommend: Medical therapy for nonobstructive CAD.  Resume apixaban tomorrow morning.  Symptoms possibly related to paroxysmal atrial fibrillation with RVR.   ECHOCARDIOGRAM COMPLETE  Result Date: 07/22/2021    ECHOCARDIOGRAM REPORT   Patient Name:   HALLA CHOPP Date of Exam: 07/22/2021 Medical Rec #:  [id number]    Height:       62.0 in Accession #:    [id number]   Weight:       128.0 lb Date of Birth:  September 28, 1932    BSA:          1.581 m Patient Age:    54 years     BP:           107/48 mmHg Patient Gender: F            HR:           65 bpm. Exam Location:  Inpatient Procedure: 2D Echo Indications:    Chest pain  History:  Patient has no prior history of Echocardiogram examinations.                 CAD; Risk Factors:Dyslipidemia.  Sonographer:    Johny Chess RDCS Referring Phys: [id number] [place]  Sonographer Comments: Image  acquisition challenging due to respiratory motion. IMPRESSIONS  1. Left ventricular ejection fraction, by estimation, is 60 to 65%. The left ventricle has normal function. The left ventricle has no regional wall motion abnormalities. Left ventricular diastolic parameters were normal. The average left ventricular global longitudinal strain is -19.4 %. The global longitudinal strain is normal.  2. Right ventricular systolic function is normal. The right ventricular size is normal. There is normal pulmonary artery systolic pressure. The estimated right ventricular systolic pressure is 22.2 mmHg.  3. The mitral valve is normal in structure. Trivial mitral valve regurgitation. No evidence of mitral stenosis.  4. The aortic valve is calcified. Aortic valve regurgitation is trivial. Aortic valve sclerosis/calcification is present, without any evidence of aortic stenosis.  5. The inferior vena cava is normal in size with greater than 50% respiratory variability, suggesting right atrial pressure of 3 mmHg. FINDINGS  Left Ventricle: Left ventricular ejection fraction, by estimation, is 60 to 65%. The left ventricle has normal function. The left ventricle has no regional wall motion abnormalities. The average left ventricular global longitudinal strain is -19.4 %. The global longitudinal strain is normal. The left ventricular internal cavity size was normal in size. There is no left ventricular hypertrophy. Left ventricular diastolic parameters were normal. Normal left ventricular filling pressure. Right Ventricle: The right ventricular size is normal. No increase in right ventricular wall thickness. Right ventricular systolic function is normal. There is normal pulmonary artery systolic pressure. The tricuspid regurgitant velocity is 2.32 m/s, and  with an assumed right atrial pressure of 3 mmHg, the estimated right ventricular systolic pressure is 97.9 mmHg. Left Atrium: Left atrial size was normal in size. Right Atrium:  Right atrial size was normal in size. Pericardium: There is no evidence of pericardial effusion. Mitral Valve: The mitral valve is normal in structure. Trivial mitral valve regurgitation. No evidence of mitral valve stenosis. Tricuspid Valve: The tricuspid valve is normal in structure. Tricuspid valve regurgitation is trivial. No evidence of tricuspid stenosis. Aortic Valve: The aortic valve is calcified. Aortic valve regurgitation is trivial. Aortic valve sclerosis/calcification is present, without any evidence of aortic stenosis. Pulmonic Valve: The pulmonic valve was normal in structure. Pulmonic valve regurgitation is not visualized. No evidence of pulmonic stenosis. Aorta: The aortic root is normal in size and structure. Venous: The inferior vena cava is normal in size with greater than 50% respiratory variability, suggesting right atrial pressure of 3 mmHg. IAS/Shunts: No atrial level shunt detected by color flow Doppler.  LEFT VENTRICLE PLAX 2D LVIDd:         3.70 cm   Diastology LVIDs:         2.20 cm   LV e' medial:    8.16 cm/s LV PW:         0.80 cm   LV E/e' medial:  10.5 LV IVS:        0.80 cm   LV e' lateral:   9.68 cm/s LVOT diam:     1.70 cm   LV E/e' lateral: 8.8 LV SV:         56 LV SV Index:   35        2D Longitudinal Strain LVOT Area:     2.27  cm  2D Strain GLS (A2C):   -14.1 %                          2D Strain GLS (A3C):   -24.0 %                          2D Strain GLS (A4C):   -19.9 %                          2D Strain GLS Avg:     -19.4 % RIGHT VENTRICLE             IVC RV S prime:     16.20 cm/s  IVC diam: 1.70 cm TAPSE (M-mode): 2.6 cm LEFT ATRIUM             Index        RIGHT ATRIUM          Index LA diam:        3.00 cm 1.90 cm/m   RA Area:     8.62 cm LA Vol (A2C):   34.6 ml 21.88 ml/m  RA Volume:   15.30 ml 9.67 ml/m LA Vol (A4C):   46.5 ml 29.40 ml/m LA Biplane Vol: 41.1 ml 25.99 ml/m  AORTIC VALVE LVOT Vmax:   115.00 cm/s LVOT Vmean:  78.400 cm/s LVOT VTI:    0.245 m  AORTA  Ao Root diam: 2.90 cm MITRAL VALVE               TRICUSPID VALVE MV Area (PHT): 3.85 cm    TR Peak grad:   21.5 mmHg MV Decel Time: 197 msec    TR Vmax:        232.00 cm/s MV E velocity: 85.30 cm/s MV A velocity: 70.30 cm/s  SHUNTS MV E/A ratio:  1.21        Systemic VTI:  0.24 m                            Systemic Diam: 1.70 cm Fransico Him MD Electronically signed by Fransico Him MD Signature Date/Time: 07/22/2021/12:23:22 PM    Final    Disposition   Pt is being discharged home today in good condition.  Follow-up Plans & Appointments   Call [facility] at [phone] if any bleeding, swelling or drainage at cath site.  May shower, no tub baths for 48 hours for groin sticks. No lifting over 5 pounds for 3 days.  No Driving for 3 days if you drive.  Heart healthy diet  Our office will contact you about wearing a monitor to determine how much atrial fib you have.    Call the office if any issues.   Follow-up Information     Belva Crome, MD Follow up.   Specialty: Cardiology Why: the office should call you with date and time on Monday, it you have not heard by Tuesday please call the office. Contact information: [address] [address] [phone]                Discharge Instructions     Amb Referral to Cardiac Rehabilitation   Complete by: As directed    Diagnosis: NSTEMI   After initial evaluation and assessments completed: Virtual Based Care may be provided alone or in conjunction  with Phase 2 Cardiac Rehab based on patient barriers.: Yes       Discharge Medications   Allergies as of 07/22/2021       Reactions   Sulfa Antibiotics Other (See Comments)   HEMATURIA   Novocain [procaine] Palpitations   Penicillins Rash   Has patient had a PCN reaction causing immediate rash, facial/tongue/throat swelling, SOB or lightheadedness with hypotension: Yes Has patient had a PCN reaction causing severe rash  involving mucus membranes or skin necrosis: Unknown Has patient had a PCN reaction that required hospitalization: No Has patient had a PCN reaction occurring within the last 10 years: No If all of the above answers are "NO", then may proceed with Cephalosporin use.        Medication List     TAKE these medications    acetaminophen 325 MG tablet Commonly known as: TYLENOL Take 2 tablets (650 mg total) by mouth every 4 (four) hours as needed for headache or mild pain.   alendronate 70 MG tablet Commonly known as: FOSAMAX Take 70 mg by mouth every Saturday. Take with a full glass of water on an empty stomach.   apixaban 2.5 MG Tabs tablet Commonly known as: ELIQUIS Take 1 tablet (2.5 mg total) by mouth 2 (two) times daily. What changed:  medication strength how much to take   artificial tears ointment Place 1 drop into both eyes 4 (four) times daily as needed (dry eyes). Refresh Tears for dry eyes.   atorvastatin 80 MG tablet Commonly known as: LIPITOR TAKE 1 TABLET BY MOUTH DAILY AT 6 PM What changed:  how much to take how to take this when to take this   calcium carbonate 750 MG chewable tablet Commonly known as: TUMS EX Chew 1-2 tablets by mouth 2 (two) times daily as needed (for hearburn/indigestion.).   cholecalciferol 1000 units tablet Commonly known as: VITAMIN D Take 1,000 Units by mouth daily.   isosorbide mononitrate 60 MG 24 hr tablet Commonly known as: IMDUR TAKE 1 TABLET(60 MG) BY MOUTH DAILY What changed: See the new instructions.   metoprolol succinate 50 MG 24 hr tablet Commonly known as: TOPROL-XL TAKE 1 TABLET BY MOUTH DAILY WITH OR IMMEDIATELY FOLLOWING A MEAL What changed: See the new instructions.   metroNIDAZOLE 0.75 % cream Commonly known as: METROCREAM Apply 1 application topically 2 (two) times daily. roseca   nitroGLYCERIN 0.4 MG SL tablet Commonly known as: NITROSTAT DISSOLVE 1 TABLET UNDER THE TONGUE EVERY 5 MINUTES AS NEEDED FOR  CHEST PAIN What changed: See the new instructions.   tobramycin-dexamethasone ophthalmic ointment Commonly known as: TOBRADEX Place 1 application into both eyes See admin instructions. Tuesday AND Saturday patient does not use. Will apply at night on all other nights.           Outstanding Labs/Studies   Consider BMP    Duration of Discharge Encounter   Greater than 30 minutes including physician time.  Signed, Cecilie Kicks, NP 07/22/2021, 3:50 PM

## 2021-07-22 NOTE — Progress Notes (Signed)
Progress Note  Patient Name: Sherry Barnett Date of Encounter: 07/22/2021  [facility] Cardiologist: Sinclair Grooms, MD   Subjective   Denies any further chest pain.  Cath yesterday showed 50% proximal to mid [name] with patent proximal [name] stent, 30% ostial to proximal left circumflex, 50% ostial RCA, 30% OM 3, 40% diagonal 2 and 10% distal to proximal left main and normal LVEDP.  Medical therapy was recommended.  It was thought that her symptoms may be related to paroxysmal atrial fibrillation with RVR...  Inpatient Medications    Scheduled Meds:  apixaban  2.5 mg Oral BID   aspirin EC  81 mg Oral Daily   atorvastatin  80 mg Oral QHS   isosorbide mononitrate  60 mg Oral Q24H   metoprolol succinate  50 mg Oral Q24H   metroNIDAZOLE   Topical BID   sodium chloride flush  3 mL Intravenous Q12H   Continuous Infusions:  sodium chloride     PRN Meds: sodium chloride, acetaminophen, nitroGLYCERIN, ondansetron (ZOFRAN) IV, sodium chloride flush   Vital Signs    Vitals:   07/21/21 1750 07/21/21 1805 07/21/21 2007 07/22/21 0451  BP: 113/75 (!) 139/29 (!) 98/57 (!) 107/48  Pulse: 62 61 63 63  Resp: 20 18    Temp:   98.2 F (36.8 C) (!) 97.5 F (36.4 C)  TempSrc:   Oral Oral  SpO2: 98% 98% 98% 96%  Weight:      Height:        Intake/Output Summary (Last 24 hours) at 07/22/2021 0912 Last data filed at 07/22/2021 0400 Gross per 24 hour  Intake 603.69 ml  Output --  Net 603.69 ml   Last 3 Weights 07/21/2021 07/21/2021 06/24/2020  Weight (lbs) 128 lb 126 lb 134 lb 9.6 oz  Weight (kg) 58.06 kg 57.153 kg 61.054 kg      Telemetry    Normal sinus rhythm- Personally Reviewed  ECG    Normal sinus rhythm at 60 bpm with no ST changes- Personally Reviewed  Physical Exam   GEN: No acute distress.   Neck: No JVD Cardiac: RRR, no murmurs, rubs, or gallops.  Right radial cath site clean dry with no hematoma Respiratory: Clear to auscultation bilaterally. GI: Soft, nontender,  non-distended  MS: No edema; No deformity. Neuro:  Nonfocal  Psych: Normal affect   Labs    High Sensitivity Troponin:   Recent Labs  Lab 07/21/21 0130 07/21/21 0440 07/21/21 0910  TROPONINIHS 7 24* 28*      Chemistry Recent Labs  Lab 07/21/21 0130 07/22/21 0225  NA 139 139  K 3.9 3.7  CL 104 106  CO2 26 25  GLUCOSE 118* 85  BUN 20 19  CREATININE 1.00 0.85  CALCIUM 9.2 8.5*  PROT 6.4*  --   ALBUMIN 3.4*  --   AST 25  --   ALT 25  --   ALKPHOS 92  --   BILITOT 0.6  --   GFRNONAA 55* >60  ANIONGAP 9 8     Hematology Recent Labs  Lab 07/21/21 0130 07/22/21 0225  WBC 7.1 7.5  RBC 4.37 3.65*  HGB 13.7 11.3*  HCT 41.5 34.6*  MCV 95.0 94.8  MCH 31.4 31.0  MCHC 33.0 32.7  RDW 13.6 13.8  PLT 203 182    BNPNo results for input(s): BNP, PROBNP in the last 168 hours.   DDimer No results for input(s): DDIMER in the last 168 hours.   CHA2DS2-VASc Score =  4   This indicates a 4.8% annual risk of stroke. The patient's score is based upon: CHF History: 0 HTN History: 0 Diabetes History: 0 Stroke History: 0 Vascular Disease History: 1 Age Score: 2 Gender Score: 1  Radiology    DG Chest 2 View  Result Date: 07/21/2021 CLINICAL DATA:  Chest pain EXAM: CHEST - 2 VIEW COMPARISON:  06/02/2018 FINDINGS: Lungs are well expanded, symmetric, and clear. No pneumothorax or pleural effusion. Cardiac size within normal limits. Pulmonary vascularity is normal. Osseous structures are age-appropriate. No acute bone abnormality. IMPRESSION: No active cardiopulmonary disease. Electronically Signed   By: Fidela Salisbury M.D.   On: 07/21/2021 01:53   CARDIAC CATHETERIZATION  Result Date: 07/21/2021   Prox [name] to Mid [name] lesion is 50% stenosed.   Ost Cx to Prox Cx lesion is 30% stenosed.   Ost RCA lesion is 50% stenosed.   Ost 2nd Diag to 2nd Diag lesion is 40% stenosed.   Ost 3rd Mrg lesion is 30% stenosed.   Dist LM to Prox [name] lesion is 10% stenosed. 1.  Patent left main  with no significant stenosis 2.  Patent proximal [name] stent with no significant in-stent restenosis.  Mild nonobstructive disease throughout the remainder of the [name] 3.  Patent left circumflex with mild diffuse plaquing but no significant stenoses 4.  Mild to moderate ostial and mid RCA stenoses without any high-grade obstruction 5.  Normal LVEDP Recommend: Medical therapy for nonobstructive CAD.  Resume apixaban tomorrow morning.  Symptoms possibly related to paroxysmal atrial fibrillation with RVR.    Cardiac Studies   Cardiac Cath 07/21/21 Conclusion      Prox [name] to Mid [name] lesion is 50% stenosed.   Ost Cx to Prox Cx lesion is 30% stenosed.   Ost RCA lesion is 50% stenosed.   Ost 2nd Diag to 2nd Diag lesion is 40% stenosed.   Ost 3rd Mrg lesion is 30% stenosed.   Dist LM to Prox [name] lesion is 10% stenosed.   1.  Patent left main with no significant stenosis 2.  Patent proximal [name] stent with no significant in-stent restenosis.  Mild nonobstructive disease throughout the remainder of the [name] 3.  Patent left circumflex with mild diffuse plaquing but no significant stenoses 4.  Mild to moderate ostial and mid RCA stenoses without any high-grade obstruction 5.  Normal LVEDP   Recommend: Medical therapy for nonobstructive CAD.  Resume apixaban tomorrow morning.  Symptoms possibly related to paroxysmal atrial fibrillation with RVR.    Patient Profile     88 y.o. female  with a history of CAD with DES to [name] in 2018, paroxysmal atrial fibrillation on Eliquis, hyperlipidemia, GERD, CKD stage III who is being seen today for the evaluation of chest pain at the request of Dr. Dayna Barker.  Assessment & Plan    1.  Unstable angina:  -Patient presented with substernal chest pain consistent with unstable angina  -At presentation she had mild ST segment depression in V4 through V6.   -She was in atrial fibrillation at that time but her rate was not fast  -Troponin minimally elevated with flat trend  at 7>24>28 -She has a history of coronary stenting in the past.   - Cath yesterday showed 50% proximal to mid [name] with patent proximal [name] stent, 30% ostial to proximal left circumflex, 50% ostial RCA, 30% OM 3, 40% diagonal 2 and 10% distal to proximal left main and normal LVEDP.  Medical therapy was recommended.   -It  was thought that her symptoms may be related to paroxysmal atrial fibrillation with RVR. -She has not had any further chest pain or pressure -Continue atorvastatin 80 mg daily, Imdur 60 mg daily, Toprol-XL 50 mg daily  -Stop aspirin due to DOAC   2.  PAF:   -She is now back in normal sinus rhythm -Restart Eliquis 2.5 mg twice daily this morning (dosed for age greater than 80 and weight less than 60 kg) -Continue Toprol-XL 50 mg daily -We will get a 30-day event monitor to assess A-fib load   3.  Hyperlipidemia:  -LDL this admission 55  -Continue atorvastatin 80 mg daily   She is stable from a cardiac standpoint for discharge  We will have her follow-up with Dr. Tamala Julian in 2 weeks and get outpt event monitor  For questions or updates, please contact [place] Please consult [email or web address] for contact info under        Signed, Fransico Him, MD  07/22/2021, 9:12 AM

## 2021-07-22 NOTE — Progress Notes (Signed)
°  Echocardiogram 2D Echocardiogram has been performed.  Johny Chess 07/22/2021, 12:02 PM

## 2021-07-24 ENCOUNTER — Encounter: Payer: Self-pay | Admitting: *Deleted

## 2021-07-24 ENCOUNTER — Encounter (HOSPITAL_COMMUNITY): Payer: Self-pay | Admitting: Cardiovascular Disease

## 2021-07-24 NOTE — Progress Notes (Signed)
Patient ID: Sherry Barnett, female   DOB: 02-09-1933, 88 y.o.   MRN: [id number] Patient enrolled for [employer] to ship a 30 day cardiac event monitor to her address on file. Letter with instructions mailed to patient.

## 2021-07-26 ENCOUNTER — Ambulatory Visit (INDEPENDENT_AMBULATORY_CARE_PROVIDER_SITE_OTHER): Payer: Medicare Other | Admitting: Physician Assistant

## 2021-07-26 ENCOUNTER — Encounter: Payer: Self-pay | Admitting: Physician Assistant

## 2021-07-26 ENCOUNTER — Other Ambulatory Visit: Payer: Self-pay

## 2021-07-26 ENCOUNTER — Encounter: Payer: Self-pay | Admitting: *Deleted

## 2021-07-26 VITALS — BP 120/50 | HR 68 | Ht 60.5 in | Wt 129.2 lb

## 2021-07-26 DIAGNOSIS — I25119 Atherosclerotic heart disease of native coronary artery with unspecified angina pectoris: Secondary | ICD-10-CM

## 2021-07-26 DIAGNOSIS — I10 Essential (primary) hypertension: Secondary | ICD-10-CM

## 2021-07-26 DIAGNOSIS — I480 Paroxysmal atrial fibrillation: Secondary | ICD-10-CM

## 2021-07-26 DIAGNOSIS — E7800 Pure hypercholesterolemia, unspecified: Secondary | ICD-10-CM

## 2021-07-26 MED ORDER — METOPROLOL TARTRATE 25 MG PO TABS: 12.5000 mg | ORAL_TABLET | ORAL | 1 refills | Status: DC | PRN

## 2021-07-26 NOTE — Progress Notes (Addendum)
Patient ID: Sherry Barnett, female   DOB: Dec 27, 1932, 88 y.o.   MRN: [id number] [employer] notified by website to cancel enrollment for 30 day cardiac event monitor placed 07/24/21.  [employer] 30 day cardiac event monitor to be applied to patient, Monday, 07/31/21, at 10:00AM.

## 2021-07-26 NOTE — Assessment & Plan Note (Signed)
S/p recent admission for chest pain as noted.  She was in AFib and converted to normal sinus rhythm in the ED.  She feels that she had an episode of atrial fibrillation this morning that was brief.  She has not received her event monitor yet.  We did try to get it applied while she was here today but she will have to return in a few days to have it applied. She is > 88 years old and < 60 kg.  She is now on Apixaban 2.5 mg twice daily.  She has 5 mg tabs at home and has been breaking them in half.  If she has trouble breaking these in half she knows to contact us so she can get her 2.5 mg tabs filled.  I will give her Rx for Metoprolol Tartrate 12.5 mg to take as needed for rapid palpitations.  She can try this if she has recurrent AFib and call us if she does not return to normal sinus rhythm.  Keep appt with Dr. Tamala Julian as planned next month.

## 2021-07-26 NOTE — Patient Instructions (Signed)
Medication Instructions:   START Metoprolol tartrate one half tablet by mouth ( 12.5 mg ) as needed for rapid palpitations.   *If you need a refill on your cardiac medications before your next appointment, please call your pharmacy*  lab test that is abnormal or we need to change your treatment, we will call you to review the results.   Testing/Procedures:   Your physician has recommended that you wear an event monitor. Event monitors are medical devices that record the hearts electrical activity. Doctors most often [place] these monitors to diagnose arrhythmias. Arrhythmias are problems with the speed or rhythm of the heartbeat. The monitor is a small, portable device. You can wear one while you do your normal daily activities. This is usually used to diagnose what is causing palpitations/syncope (passing out).    Follow-Up: At [facility], you and your health needs are our priority.  As part of our continuing mission to provide you with exceptional heart care, we have created designated Provider Care Teams.  These Care Teams include your primary Cardiologist (physician) and Advanced Practice Providers (APPs -  Physician Assistants and Nurse Practitioners) who all work together to provide you with the care you need, when you need it.  We recommend signing up for the patient portal called "MyChart".  Sign up information is provided on this After Visit Summary.  MyChart is used to connect with patients for Virtual Visits (Telemedicine).  Patients are able to view lab/test results, encounter notes, upcoming appointments, etc.  Non-urgent messages can be sent to your provider as well.   To learn more about what you can do with MyChart, go to [email or web address].    Your next appointment:   1 month(s)  The format for your next appointment:   In Person  Provider:   Sinclair Grooms, MD     Other Instructions:  If you have trouble halfing your Eliquis give office a call will send in  (2.5 mg) tablet.

## 2021-07-26 NOTE — Assessment & Plan Note (Signed)
The patient's blood pressure is controlled on her current regimen.  Continue current therapy.   

## 2021-07-26 NOTE — Progress Notes (Signed)
Cardiology Office Note:    Date:  07/26/2021   ID:  Sherry Barnett, Sherry Barnett August 24, 1932, MRN [id number]  PCP:  Lajean Manes, MD  [facility] Providers Cardiologist:  Sinclair Grooms, MD     Referring MD: Lajean Manes, MD   Chief Complaint:  Hospitalization Follow-up (Chest pain, atrial fibrillation, s/p cardiac cath)    Patient Profile: Coronary artery disease S/p DES to pLAD in 10/18 Cath 3/19: [name] stent patent, mid [name] 50-70 (neg by FFR); small vessels w diffuse atherosclerosis - Med Rx Admx w [place] 2/23>>Cath: [name] stent patent, mild-moderate nonobstructive disease elsewhere -med Rx Paroxysmal atrial fibrillation  Hyperlipidemia  Chronic kidney disease  GERD  Prior CV Studies: Echocardiogram 07/22/2021 EF 60-65, no RWMA, GLS -19.4, normal RVSF, normal PASP (RVSP 24.5), trivial MR, trivial AI, AV sclerosis without stenosis  Cardiac catheterization 07/21/2021 LM distal 10 [name] proximal stent patent with 10 ISR, mid 50; D2 ostial 40 LCx ostial 30; OM3 30 RCA ostial 50 LVEDP 15  Myoview 06/03/2018 No ischemia, inferior septal infarct versus breast attenuation anterior wall, EF 48; intermediate risk  Cardiac catheterization 08/13/17 LM ok [name] proximal stent patent, mid 50-70; D2 ostial 60-70 LCx ostial 30, distal 50; OM3 ostial 85 (unchanged) RCA ostial 30-40, mid 50-70; PDA proximal-mid 50-70 EF 55-65 FFR of mid [name] 0.87 - not hemodynamically significant Medical therapy   Cardiac catheterization 03/14/17  LM normal [name] proximal 60, mid 90-95 RCA ostial 50 LCx 50 before OM branch EF 50-55 PCI: 2.25 x 23 mm Sierra DES to the proximal-mid [name]   Event monitor 03/08/17 Study is limited by interrupted data collection.  Monitor was not worn for the entire.  (Less than 50%) No arrhythmias were noted. Basic underlying rhythm is normal sinus rhythm.   Nuclear stress test 02/27/17 EF 68, Normal perfusion, low risk   Holter monitor 01/21/14 NSR, PACs, brief supraventricular  runs   History of Present Illness:   Sherry Barnett is a 88 y.o. female with the above problem list.  She was last seen by Dr. Tamala Julian in 1/22.   She was recently admitted 2/10-2/11 with chest pain concerning for unstable angina pectoris.  She was noted to be in atrial fibrillation in the emergency room and spontaneously converted back to sinus rhythm.  She had minimally elevated troponins without significant change.  Cardiac catheterization demonstrated patent [name] stent and mild to moderate nonobstructive disease elsewhere.  Echocardiogram demonstrated normal EF.  It was thought that her symptoms may be due to paroxysmal atrial fibrillation with rapid ventricular rate.  She was set up for an outpatient event monitor to assess A-fib burden.    She returns for follow-up.  She is here today with her husband, Josph Macho.  They are headed to a celebration at the house of the President of [employer] this afternoon.  Since DC, the patient has not had chest pain, shortness of breath, syncope, significant leg edema.  She had an episode of AFib this AM that lasted just a few minutes.  She has not started wearing her monitor yet.  We tried to apply it today while she is here but we do not have any monitors.  We will arrange for the monitor be placed in the next few days.      Past Medical History:  Diagnosis Date   A-fib ([place])    Anemia    Arthritis    "a little in my hands" (08/13/2017)   Blepharitis    chronic  CAD (coronary artery disease), native coronary artery    10/18 PCI/DES to p/mLAD, normal EF   Cardiovascular risk factor    23%   Degenerative joint disease (DJD) of lumbar spine    Diverticulosis    Endometriosis    Fibroids    GERD (gastroesophageal reflux disease)    Hematuria, microscopic [phone]   Hemorrhoids    Hypercholesterolemia    10 years   Osteopenia 03/2016   start alendrodate   PONV (postoperative nausea and vomiting)    Renal disease    stage2/stage 3   Current  Medications: Current Meds  Medication Sig   acetaminophen (TYLENOL) 325 MG tablet Take 2 tablets (650 mg total) by mouth every 4 (four) hours as needed for headache or mild pain.   alendronate (FOSAMAX) 70 MG tablet Take 70 mg by mouth every Saturday. Take with a full glass of water on an empty stomach.   apixaban (ELIQUIS) 2.5 MG TABS tablet Take 1 tablet (2.5 mg total) by mouth 2 (two) times daily.   Artificial Tear Ointment (ARTIFICIAL TEARS) ointment Place 1 drop into both eyes 4 (four) times daily as needed (dry eyes). Refresh Tears for dry eyes.   atorvastatin (LIPITOR) 80 MG tablet TAKE 1 TABLET BY MOUTH DAILY AT 6 PM   calcium carbonate (TUMS EX) 750 MG chewable tablet Chew 1-2 tablets by mouth 2 (two) times daily as needed (for hearburn/indigestion.).    cholecalciferol (VITAMIN D) 1000 units tablet Take 1,000 Units by mouth daily.   isosorbide mononitrate (IMDUR) 60 MG 24 hr tablet TAKE 1 TABLET(60 MG) BY MOUTH DAILY   metoprolol succinate (TOPROL-XL) 50 MG 24 hr tablet TAKE 1 TABLET BY MOUTH DAILY WITH OR IMMEDIATELY FOLLOWING A MEAL   metoprolol tartrate (LOPRESSOR) 25 MG tablet Take 0.5 tablets (12.5 mg total) by mouth as needed (rapid palpitations).   metroNIDAZOLE (METROCREAM) 0.75 % cream Apply 1 application topically 2 (two) times daily. roseca   nitroGLYCERIN (NITROSTAT) 0.4 MG SL tablet DISSOLVE 1 TABLET UNDER THE TONGUE EVERY 5 MINUTES AS NEEDED FOR CHEST PAIN   tobramycin-dexamethasone (TOBRADEX) ophthalmic ointment Place 1 application into both eyes See admin instructions. Tuesday AND Saturday patient does not use. Will apply at night on all other nights.    Allergies:   Sulfa antibiotics, Novocain [procaine], and Penicillins   Social History   Tobacco Use   Smoking status: Former    Packs/day: 0.10    Years: 5.00    Pack years: 0.50    Types: Cigarettes    Quit date: 02/06/1969    Years since quitting: 52.5   Smokeless tobacco: Never  Vaping Use   Vaping Use:  Never used  Substance Use Topics   Alcohol use: No   Drug use: No    Family Hx: The patient's family history includes Alzheimer's disease in her father; Heart attack in her mother; Hypertension in her father; Other (age of onset: 28) in her sister; Stroke in her father.  Review of Systems  Gastrointestinal:  Negative for hematochezia.  Genitourinary:  Negative for hematuria.    EKGs/Labs/Other Test Reviewed:    EKG:  EKG is not ordered today.  The ekg ordered today demonstrates n/a  Recent Labs: 07/21/2021: ALT 25; Magnesium 1.9 07/22/2021: BUN 19; Creatinine, Ser 0.85; Hemoglobin 11.3; Platelets 182; Potassium 3.7; Sodium 139   Recent Lipid Panel Recent Labs    07/21/21 0910  CHOL 118  TRIG 39  HDL 55  VLDL 8  LDLCALC 55  Risk Assessment/Calculations:    CHA2DS2-VASc Score = 4   This indicates a 4.8% annual risk of stroke. The patient's score is based upon: CHF History: 0 HTN History: 0 Diabetes History: 0 Stroke History: 0 Vascular Disease History: 1 Age Score: 2 Gender Score: 1        Physical Exam:    VS:  BP (!) 120/50 (BP Location: Right Arm, Patient Position: Sitting, Cuff Size: Normal)    Pulse 68    Ht 5' 0.5" (1.537 m)    Wt 129 lb 3.2 oz (58.6 kg)    SpO2 96%    BMI 24.82 kg/m     Wt Readings from Last 3 Encounters:  07/26/21 129 lb 3.2 oz (58.6 kg)  07/21/21 128 lb (58.1 kg)  06/24/20 134 lb 9.6 oz (61.1 kg)    Constitutional:      Appearance: Healthy appearance. Not in distress.  Neck:     Vascular: No JVR. JVD normal.  Pulmonary:     Effort: Pulmonary effort is normal.     Breath sounds: No wheezing. No rales.  Cardiovascular:     Normal rate. Regular rhythm. Normal S1. Normal S2.      Murmurs: There is no murmur.     Comments: R wrist without hematoma; radial dressing removed by me today Edema:    Peripheral edema absent.  Abdominal:     Palpations: Abdomen is soft.  Skin:    General: Skin is warm and dry.  Neurological:      Mental Status: Alert and oriented to person, place and time.     Cranial Nerves: Cranial nerves are intact.         ASSESSMENT & PLAN:   CAD (coronary artery disease) S/p prior DES to [name] in 2018.  She was recently admitted for chest pain and cardiac catheterization demonstrated patent [name] stent and mild to mod non-obstructive disease elsewhere.  Med Rx was continued.  She was thought to have symptoms related to atrial fibrillation.  She is currently doing well without anginal symptoms.  Continue Isosorbide MN 60 mg once daily, Metoprolol succinate 50 mg once daily, Atorvastatin 80 mg once daily. She is not on ASA as she is on Apixaban. Keep f/u with Dr. Tamala Julian in March as planned.   PAF (paroxysmal atrial fibrillation) ([place]) S/p recent admission for chest pain as noted.  She was in AFib and converted to normal sinus rhythm in the ED.  She feels that she had an episode of atrial fibrillation this morning that was brief.  She has not received her event monitor yet.  We did try to get it applied while she was here today but she will have to return in a few days to have it applied. She is > 47 years old and < 60 kg.  She is now on Apixaban 2.5 mg twice daily.  She has 5 mg tabs at home and has been breaking them in half.  If she has trouble breaking these in half she knows to contact us so she can get her 2.5 mg tabs filled.  I will give her Rx for Metoprolol Tartrate 12.5 mg to take as needed for rapid palpitations.  She can try this if she has recurrent AFib and call us if she does not return to normal sinus rhythm.  Keep appt with Dr. Tamala Julian as planned next month.    Essential hypertension The patient's blood pressure is controlled on her current regimen.  Continue current therapy.  HLD (hyperlipidemia) LDL optimal.  Continue Atorvastatin 80 mg once daily.       Cardiac Rehabilitation Eligibility Assessment  The patient is ready to start cardiac rehabilitation from a cardiac standpoint.          Dispo:  Return in 27 days (on 08/22/2021) for Scheduled Follow Up with Dr. Tamala Julian.   Medication Adjustments/Labs and Tests Ordered: Current medicines are reviewed at length with the patient today.  Concerns regarding medicines are outlined above.  Tests Ordered: Orders Placed This Encounter  Procedures   Cardiac event monitor   Medication Changes: Meds ordered this encounter  Medications   metoprolol tartrate (LOPRESSOR) 25 MG tablet    Sig: Take 0.5 tablets (12.5 mg total) by mouth as needed (rapid palpitations).    Dispense:  20 tablet    Refill:  1   Signed, Richardson Dopp, PA-C  07/26/2021 4:44 PM    [facility] [place], [place],   [address] Phone: [phone]; Fax: [phone]

## 2021-07-26 NOTE — Assessment & Plan Note (Signed)
LDL optimal.  Continue Atorvastatin 80 mg once daily.

## 2021-07-26 NOTE — Assessment & Plan Note (Signed)
S/p prior DES to [name] in 2018.  She was recently admitted for chest pain and cardiac catheterization demonstrated patent [name] stent and mild to mod non-obstructive disease elsewhere.  Med Rx was continued.  She was thought to have symptoms related to atrial fibrillation.  She is currently doing well without anginal symptoms.  Continue Isosorbide MN 60 mg once daily, Metoprolol succinate 50 mg once daily, Atorvastatin 80 mg once daily. She is not on ASA as she is on Apixaban. Keep f/u with Dr. Tamala Julian in March as planned.

## 2021-07-31 ENCOUNTER — Ambulatory Visit (INDEPENDENT_AMBULATORY_CARE_PROVIDER_SITE_OTHER): Payer: Medicare Other

## 2021-07-31 ENCOUNTER — Other Ambulatory Visit: Payer: Self-pay

## 2021-07-31 DIAGNOSIS — I480 Paroxysmal atrial fibrillation: Secondary | ICD-10-CM | POA: Diagnosis not present

## 2021-07-31 DIAGNOSIS — I25119 Atherosclerotic heart disease of native coronary artery with unspecified angina pectoris: Secondary | ICD-10-CM

## 2021-07-31 DIAGNOSIS — I10 Essential (primary) hypertension: Secondary | ICD-10-CM

## 2021-07-31 DIAGNOSIS — E7800 Pure hypercholesterolemia, unspecified: Secondary | ICD-10-CM

## 2021-07-31 NOTE — Progress Notes (Deleted)
[employer] cardiac event monitor [id number] applied from office inventory using hydrocolloid strip. Patient told to call office to schedule appointment when strip / monitor battery needs to be changed if assistance required.

## 2021-07-31 NOTE — Progress Notes (Unsigned)
[employer] cardiac event monitor [id number] applied from office inventory using hydrocolloid strip. Patient told to call office to schedule appointment when strip / monitor battery needs to be changed if assistance required.

## 2021-07-31 NOTE — Progress Notes (Deleted)
Patient ID: Sherry Barnett, female   DOB: Jul 24, 1932, 88 y.o.   MRN: [id number]

## 2021-08-03 ENCOUNTER — Telehealth (HOSPITAL_COMMUNITY): Payer: Self-pay

## 2021-08-03 DIAGNOSIS — H524 Presbyopia: Secondary | ICD-10-CM | POA: Diagnosis not present

## 2021-08-03 DIAGNOSIS — Z961 Presence of intraocular lens: Secondary | ICD-10-CM | POA: Diagnosis not present

## 2021-08-03 DIAGNOSIS — H0100B Unspecified blepharitis left eye, upper and lower eyelids: Secondary | ICD-10-CM | POA: Diagnosis not present

## 2021-08-03 NOTE — Telephone Encounter (Signed)
Called and spoke with pt in regards to CR, pt stated she is not interested at this time.   Closed referral 

## 2021-08-03 NOTE — Telephone Encounter (Signed)
Pt insurance is active and benefits verified through [facility]. Co-pay $0.00, DED $0.00/$0.00 met, out of pocket $8,300.00/$83.12 met, co-insurance 0%. No pre-authorization required. Passport, 08/03/21 @ 10:17AM, REF#[id number]   2ndary insurance is active and benefits verified through [employer]. Co-pay $0.00, DED $0.00/$0.00 met, out of pocket $8,300.00/$83.12 met, co-insurance 0%. No pre-authorization required. [employer]port, 08/03/21 @ 10:19AM, PNP#[id number]    Will contact patient to see if she is interested in the Cardiac Rehab Program. If interested, patient will need to complete follow up appt. Once completed, patient will be contacted for scheduling upon review by the RN Navigator.

## 2021-08-07 ENCOUNTER — Other Ambulatory Visit: Payer: Self-pay

## 2021-08-08 DIAGNOSIS — I200 Unstable angina: Secondary | ICD-10-CM | POA: Diagnosis not present

## 2021-08-08 DIAGNOSIS — D6869 Other thrombophilia: Secondary | ICD-10-CM | POA: Diagnosis not present

## 2021-08-08 DIAGNOSIS — I480 Paroxysmal atrial fibrillation: Secondary | ICD-10-CM | POA: Diagnosis not present

## 2021-08-22 ENCOUNTER — Other Ambulatory Visit: Payer: Self-pay

## 2021-08-22 ENCOUNTER — Ambulatory Visit (INDEPENDENT_AMBULATORY_CARE_PROVIDER_SITE_OTHER): Payer: Medicare Other | Admitting: Interventional Cardiology

## 2021-08-22 ENCOUNTER — Encounter: Payer: Self-pay | Admitting: Interventional Cardiology

## 2021-08-22 VITALS — BP 100/60 | HR 61 | Ht 62.5 in | Wt 130.6 lb

## 2021-08-22 DIAGNOSIS — I480 Paroxysmal atrial fibrillation: Secondary | ICD-10-CM | POA: Diagnosis not present

## 2021-08-22 DIAGNOSIS — Z7901 Long term (current) use of anticoagulants: Secondary | ICD-10-CM

## 2021-08-22 DIAGNOSIS — E7800 Pure hypercholesterolemia, unspecified: Secondary | ICD-10-CM | POA: Diagnosis not present

## 2021-08-22 DIAGNOSIS — I10 Essential (primary) hypertension: Secondary | ICD-10-CM

## 2021-08-22 DIAGNOSIS — I25119 Atherosclerotic heart disease of native coronary artery with unspecified angina pectoris: Secondary | ICD-10-CM

## 2021-08-22 NOTE — Progress Notes (Signed)
?Cardiology Office Note:   ? ?Date:  08/22/2021  ? ?ID:  Mirtha, Jain 1932/08/13, MRN [id number] ? ?PCP:  Lajean Manes, MD  ?Cardiologist:  Sinclair Grooms, MD  ? ?Referring MD: Lajean Manes, MD  ? ?Chief Complaint  ?Patient presents with  ? Atrial Fibrillation  ? Coronary Artery Disease  ? Congestive Heart Failure  ? ? ?History of Present Illness:   ? ?Sherry Barnett is a 88 y.o. female with a hx of CAD 2018 high-grade obstruction mid [name] treated with a drug-eluting stent), paroxysmal/episodic atrial fibrillation, and hyperlipidemia.  Recently admitted to the hospital with chest pain but in the setting of atrial fibrillation.  Repeat cardiac catheterization did not reveal any significant obstructive disease with patent previously placed stent. ? ? ?She is doing okay.  She is in the third week of a long-term monitor to determine A-fib burden.  Age and weight along with kidney function has determined that her dose of Eliquis needs to be decreased to 2.5 mg twice daily. ? ?She confused instructions concerning metoprolol.  She was supposed to add 12.5 mg of metoprolol tartrate on top of succinate chronic dose of 50 mg/day but ended up just taking 12.5 mg of the tartrate daily.  In this timeframe she is felt more palpitations. ? ?Past Medical History:  ?Diagnosis Date  ? A-fib ([place])   ? Anemia   ? Arthritis   ? "a little in my hands" (08/13/2017)  ? Blepharitis   ? chronic  ? CAD (coronary artery disease), native coronary artery   ? 10/18 PCI/DES to p/mLAD, normal EF  ? Cardiovascular risk factor   ? 23%  ? Degenerative joint disease (DJD) of lumbar spine   ? Diverticulosis   ? Endometriosis   ? Fibroids   ? GERD (gastroesophageal reflux disease)   ? Hematuria, microscopic [id number]  ? Hemorrhoids   ? Hypercholesterolemia   ? 10 years  ? Osteopenia 03/2016  ? start alendrodate  ? PONV (postoperative nausea and vomiting)   ? Renal disease   ? stage2/stage 3  ? ? ?Past Surgical History:  ?Procedure Laterality Date  ?  APPENDECTOMY    ? CARDIAC CATHETERIZATION  08/13/2017  ? CATARACT EXTRACTION W/ INTRAOCULAR LENS  IMPLANT, BILATERAL Bilateral 2001 -  11/2016  ? "left-right"  ? CORONARY ANGIOPLASTY WITH STENT PLACEMENT    ? CORONARY STENT INTERVENTION N/A 03/14/2017  ? Procedure: CORONARY STENT INTERVENTION;  Surgeon: Belva Crome, MD;  Location: [facility];  Service: Cardiovascular;  Laterality: N/A;  ? DILATION AND CURETTAGE OF UTERUS    ? LEFT HEART CATH AND CORONARY ANGIOGRAPHY N/A 03/14/2017  ? Procedure: LEFT HEART CATH AND CORONARY ANGIOGRAPHY;  Surgeon: Belva Crome, MD;  Location: [facility];  Service: Cardiovascular;  Laterality: N/A;  ? LEFT HEART CATH AND CORONARY ANGIOGRAPHY N/A 08/13/2017  ? Procedure: LEFT HEART CATH AND CORONARY ANGIOGRAPHY;  Surgeon: Belva Crome, MD;  Location: [facility];  Service: Cardiovascular;  Laterality: N/A;  ? LEFT HEART CATH AND CORONARY ANGIOGRAPHY N/A 07/21/2021  ? Procedure: LEFT HEART CATH AND CORONARY ANGIOGRAPHY;  Surgeon: [name], MD;  Location: [facility];  Service: Cardiovascular;  Laterality: N/A;  ? OVARIAN CYST SURGERY Bilateral   ? SALPINGOOPHORECTOMY Bilateral   ? endometriosis; "still have a piece of my left ovary"  ? TONSILLECTOMY    ? ? ?Current Medications: ?Current Meds  ?Medication Sig  ? acetaminophen (TYLENOL) 325 MG  tablet Take 2 tablets (650 mg total) by mouth every 4 (four) hours as needed for headache or mild pain.  ? alendronate (FOSAMAX) 70 MG tablet Take 70 mg by mouth every Saturday. Take with a full glass of water on an empty stomach.  ? apixaban (ELIQUIS) 2.5 MG TABS tablet Take 1 tablet (2.5 mg total) by mouth 2 (two) times daily.  ? Artificial Tear Ointment (ARTIFICIAL TEARS) ointment Place 1 drop into both eyes 4 (four) times daily as needed (dry eyes). Refresh Tears for dry eyes.  ? atorvastatin (LIPITOR) 80 MG tablet TAKE 1 TABLET BY MOUTH DAILY AT 6 PM  ? calcium carbonate (TUMS EX) 750 MG chewable tablet Chew  1-2 tablets by mouth 2 (two) times daily as needed (for hearburn/indigestion.).   ? cholecalciferol (VITAMIN D) 1000 units tablet Take 1,000 Units by mouth daily.  ? isosorbide mononitrate (IMDUR) 60 MG 24 hr tablet TAKE 1 TABLET(60 MG) BY MOUTH DAILY  ? metoprolol succinate (TOPROL-XL) 50 MG 24 hr tablet TAKE 1 TABLET BY MOUTH DAILY WITH OR IMMEDIATELY FOLLOWING A MEAL  ? metoprolol tartrate (LOPRESSOR) 25 MG tablet Take 0.5 tablets (12.5 mg total) by mouth as needed (rapid palpitations).  ? metroNIDAZOLE (METROCREAM) 0.75 % cream Apply 1 application topically 2 (two) times daily. roseca  ? nitroGLYCERIN (NITROSTAT) 0.4 MG SL tablet DISSOLVE 1 TABLET UNDER THE TONGUE EVERY 5 MINUTES AS NEEDED FOR CHEST PAIN  ? tobramycin-dexamethasone (TOBRADEX) ophthalmic ointment Place 1 application into both eyes See admin instructions. Tuesday AND Saturday patient does not use. Will apply at night on all other nights.  ?  ? ?Allergies:   None. ? ?Social History  ? ?Socioeconomic History  ? Marital status: Married  ?  Spouse name: Not on file  ? Number of children: Not on file  ? Years of education: Not on file  ? Highest education level: Not on file  ?Occupational History  ? Not on file  ?Tobacco Use  ? Smoking status: Former  ?  Packs/day: 0.10  ?  Years: 5.00  ?  Pack years: 0.50  ?  Types: Cigarettes  ?  Quit date: 02/06/1969  ?  Years since quitting: 52.5  ? Smokeless tobacco: Never  ?Vaping Use  ? Vaping Use: Never used  ?Substance and Sexual Activity  ? Alcohol use: No  ? Drug use: No  ? Sexual activity: Not on file  ?  Comment: MARRIED  ?Other Topics Concern  ? Not on file  ?Social History Narrative  ? Not on file  ? ?Social Determinants of Health  ? ?Financial Resource Strain: Not on file  ?Food Insecurity: Not on file  ?Transportation Needs: Not on file  ?Physical Activity: Not on file  ?Stress: Not on file  ?Social Connections: Not on file  ?  ? ?Family History: ?The patient's family history includes Alzheimer's  disease in her father; Heart attack in her mother; Hypertension in her father; Other (age of onset: 65) in her sister; Stroke in her father. ? ?ROS:   ?Please see the history of present illness.   He has been wearing a monitor for 3 weeks.  She is complaining of skin irritation.  We have decided to discontinue the monitor early.  All other systems reviewed and are negative. ? ?EKGs/Labs/Other Studies Reviewed:   ? ?The following studies were reviewed today: ?No new data ? ?EKG:  EKG not repeated today.  The most recent tracing on 07/22/2021 demonstrates low voltage, leftward axis. ? ?Recent Labs: ?  07/21/2021: ALT 25; Magnesium 1.9 ?07/22/2021: BUN 19; Creatinine, Ser 0.85; Hemoglobin 11.3; Platelets 182; Potassium 3.7; Sodium 139  ?Recent Lipid Panel ?   ?Component Value Date/Time  ? CHOL 118 07/21/2021 0910  ? CHOL 116 02/09/2019 1044  ? TRIG 39 07/21/2021 0910  ? HDL 55 07/21/2021 0910  ? HDL 56 02/09/2019 1044  ? CHOLHDL 2.1 07/21/2021 0910  ? VLDL 8 07/21/2021 0910  ? LDLCALC 55 07/21/2021 0910  ? LDLCALC 46 02/09/2019 1044  ? ? ?Physical Exam:   ? ?VS:  BP 100/60   Pulse 61   Ht 5' 2.5" (1.588 m)   Wt 130 lb 9.6 oz (59.2 kg)   SpO2 97%   BMI 23.51 kg/m?    ? ?I personally repeated the blood pressure and is 130/60 mmHg. ? ?Wt Readings from Last 3 Encounters:  ?08/22/21 130 lb 9.6 oz (59.2 kg)  ?07/26/21 129 lb 3.2 oz (58.6 kg)  ?07/21/21 128 lb (58.1 kg)  ?  ? ?GEN: Elderly. No acute distress ?HEENT: Normal ?NECK: No JVD. ?LYMPHATICS: No lymphadenopathy ?CARDIAC: No murmur.  Irregular RR no gallop, or edema. ?VASCULAR:  Normal Pulses. No bruits. ?RESPIRATORY:  Clear to auscultation without rales, wheezing or rhonchi  ?ABDOMEN: Soft, non-tender, non-distended, No pulsatile mass, ?MUSCULOSKELETAL: No deformity  ?SKIN: Warm and dry ?NEUROLOGIC:  Alert and oriented x 3 ?PSYCHIATRIC:  Normal affect  ? ?ASSESSMENT:   ? ?1. PAF (paroxysmal atrial fibrillation) ([place])   ?2. Coronary artery disease involving native  coronary artery of native heart with angina pectoris ([place])   ?3. Pure hypercholesterolemia   ?4. Essential hypertension   ?5. Chronic anticoagulation   ? ?PLAN:   ? ?In order of problems listed above: ? ?Seems to be i

## 2021-08-22 NOTE — Patient Instructions (Signed)
Medication Instructions:  ?1) DISCONTINUE Metoprolol Tartrate ? ?*If you need a refill on your cardiac medications before your next appointment, please call your pharmacy* ? ? ?Lab Work: ?None ?If you have labs (blood work) drawn today and your tests are completely normal, you will receive your results only by: ?MyChart Message (if you have MyChart) OR ?A paper copy in the mail ?If you have any lab test that is abnormal or we need to change your treatment, we will call you to review the results. ? ? ?Testing/Procedures: ?None ? ? ?Follow-Up: ?At [facility], you and your health needs are our priority.  As part of our continuing mission to provide you with exceptional heart care, we have created designated Provider Care Teams.  These Care Teams include your primary Cardiologist (physician) and Advanced Practice Providers (APPs -  Physician Assistants and Nurse Practitioners) who all work together to provide you with the care you need, when you need it. ? ?We recommend signing up for the patient portal called "MyChart".  Sign up information is provided on this After Visit Summary.  MyChart is used to connect with patients for Virtual Visits (Telemedicine).  Patients are able to view lab/test results, encounter notes, upcoming appointments, etc.  Non-urgent messages can be sent to your provider as well.   ?To learn more about what you can do with MyChart, go to [email or web address].   ? ?Your next appointment:   ?6 month(s) ? ?The format for your next appointment:   ?In Person ? ?Provider:   ?Sinclair Grooms, MD  ?

## 2021-09-05 ENCOUNTER — Other Ambulatory Visit: Payer: Self-pay | Admitting: Interventional Cardiology

## 2021-11-10 DIAGNOSIS — Z79899 Other long term (current) drug therapy: Secondary | ICD-10-CM | POA: Diagnosis not present

## 2021-11-10 DIAGNOSIS — I480 Paroxysmal atrial fibrillation: Secondary | ICD-10-CM | POA: Diagnosis not present

## 2021-11-10 DIAGNOSIS — E7800 Pure hypercholesterolemia, unspecified: Secondary | ICD-10-CM | POA: Diagnosis not present

## 2021-11-10 DIAGNOSIS — D6869 Other thrombophilia: Secondary | ICD-10-CM | POA: Diagnosis not present

## 2021-11-10 DIAGNOSIS — Z0000 Encounter for general adult medical examination without abnormal findings: Secondary | ICD-10-CM | POA: Diagnosis not present

## 2021-11-13 DIAGNOSIS — H6121 Impacted cerumen, right ear: Secondary | ICD-10-CM | POA: Diagnosis not present

## 2022-01-26 DIAGNOSIS — I480 Paroxysmal atrial fibrillation: Secondary | ICD-10-CM | POA: Diagnosis not present

## 2022-01-26 DIAGNOSIS — Z780 Asymptomatic menopausal state: Secondary | ICD-10-CM | POA: Diagnosis not present

## 2022-01-26 DIAGNOSIS — M8588 Other specified disorders of bone density and structure, other site: Secondary | ICD-10-CM | POA: Diagnosis not present

## 2022-02-02 DIAGNOSIS — H10503 Unspecified blepharoconjunctivitis, bilateral: Secondary | ICD-10-CM | POA: Diagnosis not present

## 2022-02-25 NOTE — Progress Notes (Unsigned)
Cardiology Office Note:    Date:  02/26/2022   ID:  Sherry Barnett, Sherry Barnett December 13, 1932, MRN [id number]  PCP:  Sherry Manes, MD  Cardiologist:  Sherry Grooms, MD   Referring MD: Sherry Manes, MD   Chief Complaint  Patient presents with   Atrial Fibrillation   Coronary Artery Disease   Hyperlipidemia    History of Present Illness:    Sherry Barnett is a 88 y.o. female with a hx of CAD 2018 high-grade obstruction mid [name] treated with a drug-eluting stent), paroxysmal/episodic atrial fibrillation, and hyperlipidemia.  Recently admitted to the hospital with chest pain but in the setting of atrial fibrillation.  Repeat cardiac catheterization did not reveal any significant obstructive disease with patent previously placed stent.   No recurrent episodes of atrial fibrillation since the February event.  She is doing well.  She and her husband Sherry Barnett live at [employer] home.  They are still ambulatory.  He is 89, she is 28.  Sherry Barnett has some decrease in memory.  Sherry Barnett is sharp as attack.  They both walk to the cafeteria.  She has not had falls or syncope.  She denies orthopnea.  No lower extremity swelling.  Past Medical History:  Diagnosis Date   A-fib ([place])    Anemia    Arthritis    "a little in my hands" (08/13/2017)   Blepharitis    chronic   CAD (coronary artery disease), native coronary artery    10/18 PCI/DES to p/mLAD, normal EF   Cardiovascular risk factor    23%   Degenerative joint disease (DJD) of lumbar spine    Diverticulosis    Endometriosis    Fibroids    GERD (gastroesophageal reflux disease)    Hematuria, microscopic 19993   Hemorrhoids    Hypercholesterolemia    10 years   Osteopenia 03/2016   start alendrodate   PONV (postoperative nausea and vomiting)    Renal disease    stage2/stage 3    Past Surgical History:  Procedure Laterality Date   APPENDECTOMY     CARDIAC CATHETERIZATION  08/13/2017   CATARACT EXTRACTION W/ INTRAOCULAR LENS  IMPLANT,  BILATERAL Bilateral 2001 -  11/2016   "left-right"   CORONARY ANGIOPLASTY WITH STENT PLACEMENT     CORONARY STENT INTERVENTION N/A 03/14/2017   Procedure: CORONARY STENT INTERVENTION;  Surgeon: Belva Crome, MD;  Location: [facility];  Service: Cardiovascular;  Laterality: N/A;   DILATION AND CURETTAGE OF UTERUS     LEFT HEART CATH AND CORONARY ANGIOGRAPHY N/A 03/14/2017   Procedure: LEFT HEART CATH AND CORONARY ANGIOGRAPHY;  Surgeon: Belva Crome, MD;  Location: [facility];  Service: Cardiovascular;  Laterality: N/A;   LEFT HEART CATH AND CORONARY ANGIOGRAPHY N/A 08/13/2017   Procedure: LEFT HEART CATH AND CORONARY ANGIOGRAPHY;  Surgeon: Belva Crome, MD;  Location: [facility];  Service: Cardiovascular;  Laterality: N/A;   LEFT HEART CATH AND CORONARY ANGIOGRAPHY N/A 07/21/2021   Procedure: LEFT HEART CATH AND CORONARY ANGIOGRAPHY;  Surgeon: [name], MD;  Location: [facility];  Service: Cardiovascular;  Laterality: N/A;   OVARIAN CYST SURGERY Bilateral    SALPINGOOPHORECTOMY Bilateral    endometriosis; "still have a piece of my left ovary"   TONSILLECTOMY      Current Medications: Current Meds  Medication Sig   acetaminophen (TYLENOL) 325 MG tablet Take 2 tablets (650 mg total) by mouth every 4 (four) hours as needed  for headache or mild pain.   apixaban (ELIQUIS) 2.5 MG TABS tablet Take 1 tablet (2.5 mg total) by mouth 2 (two) times daily.   Artificial Tear Ointment (ARTIFICIAL TEARS) ointment Place 1 drop into both eyes 4 (four) times daily as needed (dry eyes). Refresh Tears for dry eyes.   atorvastatin (LIPITOR) 80 MG tablet TAKE 1 TABLET BY MOUTH DAILY AT 6 PM   calcium carbonate (TUMS EX) 750 MG chewable tablet Chew 1-2 tablets by mouth 2 (two) times daily as needed (for hearburn/indigestion.).    cholecalciferol (VITAMIN D) 1000 units tablet Take 1,000 Units by mouth daily.   isosorbide mononitrate (IMDUR) 60 MG 24 hr tablet TAKE 1 TABLET(60  MG) BY MOUTH DAILY   metoprolol succinate (TOPROL-XL) 50 MG 24 hr tablet TAKE 1 TABLET BY MOUTH DAILY WITH OR IMMEDIATELY FOLLOWING A MEAL   metroNIDAZOLE (METROCREAM) 0.75 % cream Apply 1 application topically 2 (two) times daily. roseca   nitroGLYCERIN (NITROSTAT) 0.4 MG SL tablet DISSOLVE 1 TABLET UNDER THE TONGUE EVERY 5 MINUTES AS NEEDED FOR CHEST PAIN   tobramycin-dexamethasone (TOBRADEX) ophthalmic ointment Place 1 application into both eyes See admin instructions. Tuesday AND Saturday patient does not use. Will apply at night on all other nights.     Allergies:   Sulfa antibiotics, Novocain [procaine], and Penicillins   Social History   Socioeconomic History   Marital status: Married    Spouse name: Not on file   Number of children: Not on file   Years of education: Not on file   Highest education level: Not on file  Occupational History   Not on file  Tobacco Use   Smoking status: Former    Packs/day: 0.10    Years: 5.00    Total pack years: 0.50    Types: Cigarettes    Quit date: 02/06/1969    Years since quitting: 53.0   Smokeless tobacco: Never  Vaping Use   Vaping Use: Never used  Substance and Sexual Activity   Alcohol use: No   Drug use: No   Sexual activity: Not on file    Comment: MARRIED  Other Topics Concern   Not on file  Social History Narrative   Not on file   Social Determinants of Health   Financial Resource Strain: Not on file  Food Insecurity: Not on file  Transportation Needs: Not on file  Physical Activity: Not on file  Stress: Not on file  Social Connections: Not on file     Family History: The patient's family history includes Alzheimer's disease in her father; Heart attack in her mother; Hypertension in her father; Other (age of onset: 13) in her sister; Stroke in her father.  ROS:   Please see the history of present illness.    Creased memory.  Appetite is stable.  All other systems reviewed and are negative.  EKGs/Labs/Other  Studies Reviewed:    The following studies were reviewed today:  CARDIAC CATH 3 2023: Diagnostic Dominance: Co-dominant  EKG:  EKG not repeated  Recent Labs: 07/21/2021: ALT 25; Magnesium 1.9 07/22/2021: BUN 19; Creatinine, Ser 0.85; Hemoglobin 11.3; Platelets 182; Potassium 3.7; Sodium 139  Recent Lipid Panel    Component Value Date/Time   CHOL 118 07/21/2021 0910   CHOL 116 02/09/2019 1044   TRIG 39 07/21/2021 0910   HDL 55 07/21/2021 0910   HDL 56 02/09/2019 1044   CHOLHDL 2.1 07/21/2021 0910   VLDL 8 07/21/2021 0910   LDLCALC 55 07/21/2021 0910  LDLCALC 46 02/09/2019 1044    Physical Exam:    VS:  BP 110/60   Pulse (!) 58   Ht 5' 2.5" (1.588 m)   Wt 131 lb 12.8 oz (59.8 kg)   BMI 23.72 kg/m     Wt Readings from Last 3 Encounters:  02/26/22 131 lb 12.8 oz (59.8 kg)  08/22/21 130 lb 9.6 oz (59.2 kg)  07/26/21 129 lb 3.2 oz (58.6 kg)     GEN: Appears younger than his stated age. No acute distress HEENT: Normal NECK: No JVD. LYMPHATICS: No lymphadenopathy CARDIAC: No murmur. RRR no gallop, or edema. VASCULAR:  Normal Pulses. No bruits. RESPIRATORY:  Clear to auscultation without rales, wheezing or rhonchi  ABDOMEN: Soft, non-tender, non-distended, No pulsatile mass, MUSCULOSKELETAL: No deformity  SKIN: Warm and dry NEUROLOGIC:  Alert and oriented x 3 PSYCHIATRIC:  Normal affect   ASSESSMENT:    1. Coronary artery disease involving native coronary artery of native heart with angina pectoris ([place])   2. PAF (paroxysmal atrial fibrillation) ([facility])   3. Pure hypercholesterolemia   4. Essential hypertension   5. Chronic anticoagulation    PLAN:    In order of problems listed above:  Secondary prevention reviewed.  Continue statin therapy. Clinical exam reveals normal sinus rhythm. Continue statin therapy   Medication Adjustments/Labs and Tests Ordered: Current medicines are reviewed at length with the patient today.  Concerns regarding medicines are  outlined above.  No orders of the defined types were placed in this encounter.  No orders of the defined types were placed in this encounter.   Patient Instructions  Medication Instructions:  Your physician recommends that you continue on your current medications as directed. Please refer to the Current Medication list given to you today.  *If you need a refill on your cardiac medications before your next appointment, please call your pharmacy*  Lab Work: NONE  Testing/Procedures: NONE  Follow-Up: At [facility], you and your health needs are our priority.  As part of our continuing mission to provide you with exceptional heart care, we have created designated Provider Care Teams.  These Care Teams include your primary Cardiologist (physician) and Advanced Practice Providers (APPs -  Physician Assistants and Nurse Practitioners) who all work together to provide you with the care you need, when you need it.  Your next appointment:   9-12 month(s)  The format for your next appointment:   In Person  Provider:   Sinclair Grooms, MD    Important Information About Sugar         Signed, Sherry Grooms, MD  02/26/2022 10:59 AM    [place]

## 2022-02-26 ENCOUNTER — Encounter: Payer: Self-pay | Admitting: Interventional Cardiology

## 2022-02-26 ENCOUNTER — Ambulatory Visit: Payer: Medicare Other | Attending: Interventional Cardiology | Admitting: Interventional Cardiology

## 2022-02-26 VITALS — BP 110/60 | HR 58 | Ht 62.5 in | Wt 131.8 lb

## 2022-02-26 DIAGNOSIS — I25119 Atherosclerotic heart disease of native coronary artery with unspecified angina pectoris: Secondary | ICD-10-CM

## 2022-02-26 DIAGNOSIS — Z7901 Long term (current) use of anticoagulants: Secondary | ICD-10-CM

## 2022-02-26 DIAGNOSIS — I480 Paroxysmal atrial fibrillation: Secondary | ICD-10-CM

## 2022-02-26 DIAGNOSIS — I10 Essential (primary) hypertension: Secondary | ICD-10-CM

## 2022-02-26 DIAGNOSIS — E7800 Pure hypercholesterolemia, unspecified: Secondary | ICD-10-CM | POA: Diagnosis not present

## 2022-02-26 NOTE — Patient Instructions (Signed)
Medication Instructions:  Your physician recommends that you continue on your current medications as directed. Please refer to the Current Medication list given to you today.  *If you need a refill on your cardiac medications before your next appointment, please call your pharmacy*  Lab Work: NONE  Testing/Procedures: NONE  Follow-Up: At [facility], you and your health needs are our priority.  As part of our continuing mission to provide you with exceptional heart care, we have created designated Provider Care Teams.  These Care Teams include your primary Cardiologist (physician) and Advanced Practice Providers (APPs -  Physician Assistants and Nurse Practitioners) who all work together to provide you with the care you need, when you need it.  Your next appointment:   9-12 month(s)  The format for your next appointment:   In Person  Provider:   Sinclair Grooms, MD    Important Information About Sugar

## 2022-06-18 DIAGNOSIS — J069 Acute upper respiratory infection, unspecified: Secondary | ICD-10-CM | POA: Diagnosis not present

## 2022-06-18 DIAGNOSIS — R051 Acute cough: Secondary | ICD-10-CM | POA: Diagnosis not present

## 2022-06-19 ENCOUNTER — Other Ambulatory Visit: Payer: Self-pay | Admitting: Internal Medicine

## 2022-06-19 ENCOUNTER — Ambulatory Visit
Admission: RE | Admit: 2022-06-19 | Discharge: 2022-06-19 | Disposition: A | Discharge status: Home / Self Care | Payer: Medicare Other | Source: Ambulatory Visit | Attending: Internal Medicine | Admitting: Internal Medicine

## 2022-06-19 DIAGNOSIS — R051 Acute cough: Secondary | ICD-10-CM

## 2022-06-19 DIAGNOSIS — R059 Cough, unspecified: Secondary | ICD-10-CM | POA: Diagnosis not present

## 2022-07-06 ENCOUNTER — Other Ambulatory Visit: Payer: Self-pay | Admitting: Nurse Practitioner

## 2022-07-06 ENCOUNTER — Other Ambulatory Visit: Payer: Self-pay

## 2022-07-06 MED ORDER — METOPROLOL SUCCINATE ER 50 MG PO TB24: ORAL_TABLET | ORAL | 0 refills | Status: DC

## 2022-07-30 DIAGNOSIS — E7800 Pure hypercholesterolemia, unspecified: Secondary | ICD-10-CM | POA: Diagnosis not present

## 2022-07-30 DIAGNOSIS — I4891 Unspecified atrial fibrillation: Secondary | ICD-10-CM | POA: Diagnosis not present

## 2022-07-30 DIAGNOSIS — I209 Angina pectoris, unspecified: Secondary | ICD-10-CM | POA: Diagnosis not present

## 2022-07-30 DIAGNOSIS — D6869 Other thrombophilia: Secondary | ICD-10-CM | POA: Diagnosis not present

## 2022-08-31 ENCOUNTER — Other Ambulatory Visit: Payer: Self-pay

## 2022-08-31 MED ORDER — ISOSORBIDE MONONITRATE ER 60 MG PO TB24: ORAL_TABLET | ORAL | 1 refills | Status: DC

## 2022-08-31 MED ORDER — ATORVASTATIN CALCIUM 80 MG PO TABS: 80.0000 mg | ORAL_TABLET | Freq: Every day | ORAL | 1 refills | Status: DC

## 2022-08-31 NOTE — Addendum Note (Signed)
Addended by: Carter Kitten D on: 08/31/2022 12:11 PM   Modules accepted: Orders

## 2022-11-19 DIAGNOSIS — H1132 Conjunctival hemorrhage, left eye: Secondary | ICD-10-CM | POA: Diagnosis not present

## 2022-11-27 DIAGNOSIS — Z0000 Encounter for general adult medical examination without abnormal findings: Secondary | ICD-10-CM | POA: Diagnosis not present

## 2022-11-27 DIAGNOSIS — Z79899 Other long term (current) drug therapy: Secondary | ICD-10-CM | POA: Diagnosis not present

## 2022-11-27 DIAGNOSIS — E7800 Pure hypercholesterolemia, unspecified: Secondary | ICD-10-CM | POA: Diagnosis not present

## 2022-11-28 LAB — LAB REPORT - SCANNED: EGFR: 61

## 2022-12-04 NOTE — Progress Notes (Signed)
Cardiology Office Note:    Date:  12/06/2022   ID:  Sherry Barnett, DOB 20-Jul-1932, MRN [id number]  Pt presents for follow up of CAD    History of Present Illness:    Sherry Barnett is a 89 y.o. female with a hx of CAD 2018 high-grade obstruction mid [name] treated with a drug-eluting stent), paroxysmal/episodic atrial fibrillation, and hyperlipidemia.  In 2019 had CP  Underwent repeat LHC showing no significant CAD, patent stents      The pt was  previously seen by Mendel Ryder, last time in Fall 2023 She denies CP  Breathing is OK    Her daughter is with her   Says she got a little winded while at [employer] active at [facility]   Also plays organ at church as substitute      Denies dizzienss  No palpitations    Past Medical History:  Diagnosis Date   A-fib (HCC)    Anemia    Arthritis    "a little in my hands" (08/13/2017)   Blepharitis    chronic   CAD (coronary artery disease), native coronary artery    10/18 PCI/DES to p/mLAD, normal EF   Cardiovascular risk factor    23%   Degenerative joint disease (DJD) of lumbar spine    Diverticulosis    Endometriosis    Fibroids    GERD (gastroesophageal reflux disease)    Hematuria, microscopic 19993   Hemorrhoids    Hypercholesterolemia    10 years   Osteopenia 03/2016   start alendrodate   PONV (postoperative nausea and vomiting)    Renal disease    stage2/stage 3    Past Surgical History:  Procedure Laterality Date   APPENDECTOMY     CARDIAC CATHETERIZATION  08/13/2017   CATARACT EXTRACTION W/ INTRAOCULAR LENS  IMPLANT, BILATERAL Bilateral 2001 -  11/2016   "left-right"   CORONARY ANGIOPLASTY WITH STENT PLACEMENT     CORONARY STENT INTERVENTION N/A 03/14/2017   Procedure: CORONARY STENT INTERVENTION;  Surgeon: Lyn Records, MD;  [facility];  Service: Cardiovascular;  Laterality: N/A;   DILATION AND CURETTAGE OF UTERUS     LEFT HEART CATH AND CORONARY ANGIOGRAPHY N/A 03/14/2017   Procedure:  LEFT HEART CATH AND CORONARY ANGIOGRAPHY;  Surgeon: Lyn Records, MD;  [facility];  Service: Cardiovascular;  Laterality: N/A;   LEFT HEART CATH AND CORONARY ANGIOGRAPHY N/A 08/13/2017   Procedure: LEFT HEART CATH AND CORONARY ANGIOGRAPHY;  Surgeon: Lyn Records, MD;  [facility];  Service: Cardiovascular;  Laterality: N/A;   LEFT HEART CATH AND CORONARY ANGIOGRAPHY N/A 07/21/2021   Procedure: LEFT HEART CATH AND CORONARY ANGIOGRAPHY;  Surgeon: Tonny Bollman, MD;  Location: [facility] INVASIVE CV LAB;  Service: Cardiovascular;  Laterality: N/A;   OVARIAN CYST SURGERY Bilateral    SALPINGOOPHORECTOMY Bilateral    endometriosis; "still have a piece of my left ovary"   TONSILLECTOMY      Current Medications: Current Meds  Medication Sig   acetaminophen (TYLENOL) 325 MG tablet Take 2 tablets (650 mg total) by mouth every 4 (four) hours as needed for headache or mild pain.   apixaban (ELIQUIS) 2.5 MG TABS tablet Take 1 tablet (2.5 mg total) by mouth 2 (two) times daily.   Artificial Tear Ointment (ARTIFICIAL TEARS) ointment Place 1 drop into both eyes 4 (four) times daily as needed (dry eyes). Refresh Tears for dry eyes.   atorvastatin (  LIPITOR) 80 MG tablet Take 1 tablet (80 mg total) by mouth daily at 6 PM.   calcium carbonate (TUMS EX) 750 MG chewable tablet Chew 1-2 tablets by mouth 2 (two) times daily as needed (for hearburn/indigestion.).    cholecalciferol (VITAMIN D) 1000 units tablet Take 1,000 Units by mouth daily.   isosorbide mononitrate (IMDUR) 60 MG 24 hr tablet TAKE 1 TABLET(60 MG) BY MOUTH DAILY   metoprolol succinate (TOPROL-XL) 50 MG 24 hr tablet TAKE 1 TABLET BY MOUTH DAILY WITH OR IMMEDIATELY FOLLOWING A MEAL   metroNIDAZOLE (METROCREAM) 0.75 % cream Apply 1 application topically 2 (two) times daily. roseca   nitroGLYCERIN (NITROSTAT) 0.4 MG SL tablet DISSOLVE 1 TABLET UNDER THE TONGUE EVERY 5 MINUTES AS NEEDED FOR CHEST PAIN    tobramycin-dexamethasone (TOBRADEX) ophthalmic ointment Place 1 application into both eyes See admin instructions. Tuesday AND Saturday patient does not use. Will apply at night on all other nights.     Allergies:   Sulfa antibiotics, Novocain [procaine], and Penicillins   Social History   Socioeconomic History   Marital status: Married    Spouse name: Not on file   Number of children: Not on file   Years of education: Not on file   Highest education level: Not on file  Occupational History   Not on file  Tobacco Use   Smoking status: Former    Packs/day: 0.10    Years: 5.00    Additional pack years: 0.00    Total pack years: 0.50    Types: Cigarettes    Quit date: 02/06/1969    Years since quitting: 53.8   Smokeless tobacco: Never  Vaping Use   Vaping Use: Never used  Substance and Sexual Activity   Alcohol use: No   Drug use: No   Sexual activity: Not on file    Comment: MARRIED  Other Topics Concern   Not on file  Social History Narrative   Not on file   Social Determinants of Health   Financial Resource Strain: Not on file  Food Insecurity: Not on file  Transportation Needs: Not on file  Physical Activity: Not on file  Stress: Not on file  Social Connections: Not on file     Family History: The patient's family history includes Alzheimer's disease in her father; Heart attack in her mother; Hypertension in her father; Other (age of onset: 58) in her sister; Stroke in her father.  ROS:   Please see the history of present illness.    Creased memory.  Appetite is stable.  All other systems reviewed and are negative.  EKGs/Labs/Other Studies Reviewed:    The following studies were reviewed today:  CARDIAC CATH 3 2023: Diagnostic Dominance: Co-dominant  EKG:  EKG Sinus bradycardia 55 bpm   Recent Labs: No results found for requested labs within last 365 days.  Recent Lipid Panel    Component Value Date/Time   CHOL 118 07/21/2021 0910   CHOL 116  02/09/2019 1044   TRIG 39 07/21/2021 0910   HDL 55 07/21/2021 0910   HDL 56 02/09/2019 1044   CHOLHDL 2.1 07/21/2021 0910   VLDL 8 07/21/2021 0910   LDLCALC 55 07/21/2021 0910   LDLCALC 46 02/09/2019 1044    Physical Exam:    VS:  BP 108/64   Pulse (!) 55   Ht 5' 2.5" (1.588 m)   Wt 130 lb 12.8 oz (59.3 kg)   SpO2 98%   BMI 23.54 kg/m     Wt  Readings from Last 3 Encounters:  12/06/22 130 lb 12.8 oz (59.3 kg)  02/26/22 131 lb 12.8 oz (59.8 kg)  08/22/21 130 lb 9.6 oz (59.2 kg)     GEN: Appears younger than his stated age. No acute distress  NECK: No JVD. CARDIAC: No murmur. RRR No LE  edema. VASCULAR:  Normal Pulses. No bruits. RESPIRATORY:  Clear to auscultation without rales, ABDOMEN: Soft, non-tender, non-distended, No pulsatile mass, MUSCULOSKELETAL: No deformity  SKIN: Warm and dry NEUROLOGIC:  Alert and oriented x 3 PSYCHIATRIC:  Normal affect   ASSESSMENT:    1 CAD   Stent in 2018  No symptoms to suggest angina   Follow   2 Hx HTN  Pt says her BP is usually in the 120s/   Follow   3  HL  LDL excellen   47  HDL 65    4  Bradycardia  HR a little low   Shewore a monitor in the past   Average HR over 60   Follow    Stay active      Follow up in 6 months    Medication Adjustments/Labs and Tests Ordered: Current medicines are reviewed at length with the patient today.  Concerns regarding medicines are outlined above.  Orders Placed This Encounter  Procedures   EKG 12-Lead   No orders of the defined types were placed in this encounter.   Patient Instructions  Medication Instructions:  Your physician recommends that you continue on your current medications as directed. Please refer to the Current Medication list given to you today.  *If you need a refill on your cardiac medications before your next appointment, please call your pharmacy*   Follow-Up: At [facility], you and your health needs are our priority.  As part of our continuing  mission to provide you with exceptional heart care, we have created designated Provider Care Teams.  These Care Teams include your primary Cardiologist (physician) and Advanced Practice Providers (APPs -  Physician Assistants and Nurse Practitioners) who all work together to provide you with the care you need, when you need it.  We recommend signing up for the patient portal called "MyChart".  Sign up information is provided on this After Visit Summary.  MyChart is used to connect with patients for Virtual Visits (Telemedicine).  Patients are able to view lab/test results, encounter notes, upcoming appointments, etc.  Non-urgent messages can be sent to your provider as well.   To learn more about what you can do with MyChart, go to [email or web address].    Your next appointment:   January 2025  Provider:   Dietrich Pates, MD     Signed, Dietrich Pates, MD  12/06/2022 1:20 PM    [facility]

## 2022-12-06 ENCOUNTER — Ambulatory Visit: Payer: Medicare Other | Attending: Internal Medicine | Admitting: Internal Medicine

## 2022-12-06 ENCOUNTER — Encounter: Payer: Self-pay | Admitting: Internal Medicine

## 2022-12-06 VITALS — BP 108/64 | HR 55 | Ht 62.5 in | Wt 130.8 lb

## 2022-12-06 DIAGNOSIS — I25119 Atherosclerotic heart disease of native coronary artery with unspecified angina pectoris: Secondary | ICD-10-CM

## 2022-12-06 DIAGNOSIS — I10 Essential (primary) hypertension: Secondary | ICD-10-CM

## 2022-12-06 DIAGNOSIS — I480 Paroxysmal atrial fibrillation: Secondary | ICD-10-CM

## 2022-12-06 NOTE — Patient Instructions (Signed)
Medication Instructions:  Your physician recommends that you continue on your current medications as directed. Please refer to the Current Medication list given to you today.  *If you need a refill on your cardiac medications before your next appointment, please call your pharmacy*   Follow-Up: At [facility], you and your health needs are our priority.  As part of our continuing mission to provide you with exceptional heart care, we have created designated Provider Care Teams.  These Care Teams include your primary Cardiologist (physician) and Advanced Practice Providers (APPs -  Physician Assistants and Nurse Practitioners) who all work together to provide you with the care you need, when you need it.  We recommend signing up for the patient portal called "MyChart".  Sign up information is provided on this After Visit Summary.  MyChart is used to connect with patients for Virtual Visits (Telemedicine).  Patients are able to view lab/test results, encounter notes, upcoming appointments, etc.  Non-urgent messages can be sent to your provider as well.   To learn more about what you can do with MyChart, go to [email or web address].    Your next appointment:   January 2025  Provider:   Dietrich Pates, MD

## 2023-01-01 DIAGNOSIS — T1512XA Foreign body in conjunctival sac, left eye, initial encounter: Secondary | ICD-10-CM | POA: Diagnosis not present

## 2023-01-02 ENCOUNTER — Other Ambulatory Visit: Payer: Self-pay

## 2023-01-02 MED ORDER — METOPROLOL SUCCINATE ER 50 MG PO TB24: ORAL_TABLET | ORAL | 3 refills | Status: DC

## 2023-01-07 DIAGNOSIS — H04123 Dry eye syndrome of bilateral lacrimal glands: Secondary | ICD-10-CM | POA: Diagnosis not present

## 2023-01-07 DIAGNOSIS — H524 Presbyopia: Secondary | ICD-10-CM | POA: Diagnosis not present

## 2023-01-07 DIAGNOSIS — Z961 Presence of intraocular lens: Secondary | ICD-10-CM | POA: Diagnosis not present

## 2023-02-27 ENCOUNTER — Other Ambulatory Visit: Payer: Self-pay | Admitting: Physician Assistant

## 2023-04-23 DIAGNOSIS — H6121 Impacted cerumen, right ear: Secondary | ICD-10-CM | POA: Diagnosis not present

## 2023-05-29 DIAGNOSIS — J439 Emphysema, unspecified: Secondary | ICD-10-CM | POA: Diagnosis not present

## 2023-05-29 DIAGNOSIS — I209 Angina pectoris, unspecified: Secondary | ICD-10-CM | POA: Diagnosis not present

## 2023-05-29 DIAGNOSIS — I480 Paroxysmal atrial fibrillation: Secondary | ICD-10-CM | POA: Diagnosis not present

## 2023-05-29 DIAGNOSIS — D6869 Other thrombophilia: Secondary | ICD-10-CM | POA: Diagnosis not present

## 2023-05-29 DIAGNOSIS — F4321 Adjustment disorder with depressed mood: Secondary | ICD-10-CM | POA: Diagnosis not present

## 2023-09-19 DIAGNOSIS — H0100B Unspecified blepharitis left eye, upper and lower eyelids: Secondary | ICD-10-CM | POA: Diagnosis not present

## 2023-09-19 DIAGNOSIS — H0100A Unspecified blepharitis right eye, upper and lower eyelids: Secondary | ICD-10-CM | POA: Diagnosis not present

## 2023-10-03 DIAGNOSIS — H10413 Chronic giant papillary conjunctivitis, bilateral: Secondary | ICD-10-CM | POA: Diagnosis not present

## 2023-10-25 DIAGNOSIS — H0100B Unspecified blepharitis left eye, upper and lower eyelids: Secondary | ICD-10-CM | POA: Diagnosis not present

## 2023-10-25 DIAGNOSIS — H0100A Unspecified blepharitis right eye, upper and lower eyelids: Secondary | ICD-10-CM | POA: Diagnosis not present

## 2023-10-25 DIAGNOSIS — H04123 Dry eye syndrome of bilateral lacrimal glands: Secondary | ICD-10-CM | POA: Diagnosis not present

## 2023-11-24 ENCOUNTER — Other Ambulatory Visit: Payer: Self-pay | Admitting: Physician Assistant

## 2023-11-29 ENCOUNTER — Other Ambulatory Visit: Payer: Self-pay | Admitting: Internal Medicine

## 2023-12-02 ENCOUNTER — Other Ambulatory Visit: Payer: Self-pay

## 2023-12-02 DIAGNOSIS — Z0000 Encounter for general adult medical examination without abnormal findings: Secondary | ICD-10-CM | POA: Diagnosis not present

## 2023-12-02 DIAGNOSIS — R413 Other amnesia: Secondary | ICD-10-CM | POA: Diagnosis not present

## 2023-12-02 DIAGNOSIS — Z23 Encounter for immunization: Secondary | ICD-10-CM | POA: Diagnosis not present

## 2023-12-02 DIAGNOSIS — I480 Paroxysmal atrial fibrillation: Secondary | ICD-10-CM | POA: Diagnosis not present

## 2023-12-02 DIAGNOSIS — Z79899 Other long term (current) drug therapy: Secondary | ICD-10-CM | POA: Diagnosis not present

## 2023-12-02 DIAGNOSIS — D6869 Other thrombophilia: Secondary | ICD-10-CM | POA: Diagnosis not present

## 2023-12-02 DIAGNOSIS — M8580 Other specified disorders of bone density and structure, unspecified site: Secondary | ICD-10-CM | POA: Diagnosis not present

## 2023-12-02 DIAGNOSIS — E7800 Pure hypercholesterolemia, unspecified: Secondary | ICD-10-CM | POA: Diagnosis not present

## 2023-12-02 DIAGNOSIS — F4321 Adjustment disorder with depressed mood: Secondary | ICD-10-CM | POA: Diagnosis not present

## 2023-12-02 DIAGNOSIS — I209 Angina pectoris, unspecified: Secondary | ICD-10-CM | POA: Diagnosis not present

## 2023-12-02 DIAGNOSIS — Z1331 Encounter for screening for depression: Secondary | ICD-10-CM | POA: Diagnosis not present

## 2023-12-02 MED ORDER — METOPROLOL SUCCINATE ER 50 MG PO TB24: ORAL_TABLET | ORAL | 0 refills | Status: AC

## 2024-01-02 DIAGNOSIS — F4321 Adjustment disorder with depressed mood: Secondary | ICD-10-CM | POA: Diagnosis not present

## 2024-01-02 DIAGNOSIS — R413 Other amnesia: Secondary | ICD-10-CM | POA: Diagnosis not present

## 2024-01-09 DIAGNOSIS — Z961 Presence of intraocular lens: Secondary | ICD-10-CM | POA: Diagnosis not present

## 2024-01-09 DIAGNOSIS — H524 Presbyopia: Secondary | ICD-10-CM | POA: Diagnosis not present

## 2024-01-27 ENCOUNTER — Ambulatory Visit: Attending: Internal Medicine | Admitting: Internal Medicine

## 2024-01-27 ENCOUNTER — Encounter: Payer: Self-pay | Admitting: Internal Medicine

## 2024-01-27 VITALS — BP 110/43 | HR 56 | Ht 62.0 in | Wt 131.0 lb

## 2024-01-27 DIAGNOSIS — I10 Essential (primary) hypertension: Secondary | ICD-10-CM

## 2024-01-27 DIAGNOSIS — I480 Paroxysmal atrial fibrillation: Secondary | ICD-10-CM

## 2024-01-27 DIAGNOSIS — Z7901 Long term (current) use of anticoagulants: Secondary | ICD-10-CM

## 2024-01-27 DIAGNOSIS — I25119 Atherosclerotic heart disease of native coronary artery with unspecified angina pectoris: Secondary | ICD-10-CM

## 2024-01-27 DIAGNOSIS — E7800 Pure hypercholesterolemia, unspecified: Secondary | ICD-10-CM

## 2024-01-27 NOTE — Patient Instructions (Signed)
 Medication Instructions:  Your physician recommends that you continue on your current medications as directed. Please refer to the Current Medication list given to you today.  *If you need a refill on your cardiac medications before your next appointment, please call your pharmacy*  Follow-Up: At [facility], you and your health needs are our priority.  As part of our continuing mission to provide you with exceptional heart care, our providers are all part of one team.  This team includes your primary Cardiologist (physician) and Advanced Practice Providers or APPs (Physician Assistants and Nurse Practitioners) who all work together to provide you with the care you need, when you need it.  Your next appointment:   10 month(s)  Provider:   Vina Gull, MD   We recommend signing up for the patient portal called MyChart.  Sign up information is provided on this After Visit Summary.  MyChart is used to connect with patients for Virtual Visits (Telemedicine).  Patients are able to view lab/test results, encounter notes, upcoming appointments, etc.  Non-urgent messages can be sent to your provider as well.    To learn more about what you can do with MyChart, go to [email or web address].

## 2024-01-27 NOTE — Progress Notes (Unsigned)
 Cardiology Office Note:    Date:  01/27/2024   ID:  Sherry Barnett, DOB 1932/06/12, MRN [id number]  Pt presents for follow up of CAD    History of Present Illness:    Sherry Barnett is a 89 y.o. 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  I saw her in June 2024  SInce seen she has done well from a cardiac standoint  Breathing is good   She denies CP   No dizziness   Remains active      Past Medical History:  Diagnosis Date   A-fib (HCC)    Anemia    Arthritis    a little in my hands (08/13/2017)   Blepharitis    chronic   CAD (coronary artery disease), native coronary artery    10/18 PCI/DES to p/mLAD, normal EF   Cardiovascular risk factor    23%   Degenerative joint disease (DJD) of lumbar spine    Diverticulosis    Endometriosis    Fibroids    GERD (gastroesophageal reflux disease)    Hematuria, microscopic 19993   Hemorrhoids    Hypercholesterolemia    10 years   Osteopenia 03/2016   start alendrodate   PONV (postoperative nausea and vomiting)    Renal disease    stage2/stage 3    Past Surgical History:  Procedure Laterality Date   APPENDECTOMY     CARDIAC CATHETERIZATION  08/13/2017   CATARACT EXTRACTION W/ INTRAOCULAR LENS  IMPLANT, BILATERAL Bilateral 2001 -  11/2016   left-right   CORONARY ANGIOPLASTY WITH STENT PLACEMENT     CORONARY STENT INTERVENTION N/A 03/14/2017   Procedure: CORONARY STENT INTERVENTION;  Surgeon: Sharps Victory ORN, MD;  [facility];  Service: Cardiovascular;  Laterality: N/A;   DILATION AND CURETTAGE OF UTERUS     LEFT HEART CATH AND CORONARY ANGIOGRAPHY N/A 03/14/2017   Procedure: LEFT HEART CATH AND CORONARY ANGIOGRAPHY;  Surgeon: Sharps Victory ORN, MD;   [facility]: [facility];  Service: Cardiovascular;  Laterality: N/A;   LEFT HEART CATH AND CORONARY ANGIOGRAPHY N/A 08/13/2017   Procedure: LEFT HEART CATH AND CORONARY ANGIOGRAPHY;  Surgeon: Sharps Victory ORN, MD;  [facility]: [facility];  Service: Cardiovascular;  Laterality: N/A;   LEFT HEART CATH AND CORONARY ANGIOGRAPHY N/A 07/21/2021   Procedure: LEFT HEART CATH AND CORONARY ANGIOGRAPHY;  Surgeon: Wonda Sharper, MD;  Location: [facility] INVASIVE CV LAB;  Service: Cardiovascular;  Laterality: N/A;   OVARIAN CYST SURGERY Bilateral    SALPINGOOPHORECTOMY Bilateral    endometriosis; still have a piece of my left ovary   TONSILLECTOMY      Current Medications: Current Meds  Medication Sig   acetaminophen  (TYLENOL ) 325 MG tablet Take 2 tablets (650 mg total) by mouth every 4 (four) hours as needed for headache or mild pain.   apixaban  (ELIQUIS ) 2.5 MG TABS tablet Take 1 tablet (2.5 mg total) by mouth 2 (two) times daily.   Artificial Tear Ointment (ARTIFICIAL TEARS) ointment Place 1 drop into both eyes 4 (four) times daily as needed (dry eyes). Refresh Tears for dry eyes.   atorvastatin  (  LIPITOR ) 80 MG tablet TAKE 1 TABLET(80 MG) BY MOUTH DAILY AT 6 PM   calcium  carbonate (TUMS EX) 750 MG chewable tablet Chew 1-2 tablets by mouth 2 (two) times daily as needed (for hearburn/indigestion.).    cholecalciferol  (VITAMIN D ) 1000 units tablet Take 1,000 Units by mouth daily.   isosorbide  mononitrate (IMDUR ) 60 MG 24 hr tablet TAKE 1 TABLET(60 MG) BY MOUTH DAILY   metoprolol  succinate (TOPROL -XL) 25 MG 24 hr tablet Take 25 mg by mouth daily.   metroNIDAZOLE  (METROCREAM ) 0.75 % cream Apply 1 application topically 2 (two) times daily. roseca   nitroGLYCERIN  (NITROSTAT ) 0.4 MG SL tablet DISSOLVE 1 TABLET UNDER THE TONGUE EVERY 5 MINUTES AS NEEDED FOR CHEST PAIN   tobramycin -dexamethasone  (TOBRADEX ) ophthalmic ointment Place 1 application into both eyes See admin instructions. Tuesday AND Saturday  patient does not use. Will apply at night on all other nights.     Allergies:   Sulfa antibiotics, Penicillins, and Procaine   Social History   Socioeconomic History   Marital status: Married    Spouse name: Not on file   Number of children: Not on file   Years of education: Not on file   Highest education level: Not on file  Occupational History   Not on file  Tobacco Use   Smoking status: Former    Current packs/day: 0.00    Average packs/day: 0.1 packs/day for 5.0 years (0.5 ttl pk-yrs)    Types: Cigarettes    Start date: 02/07/1964    Quit date: 02/06/1969    Years since quitting: 55.0   Smokeless tobacco: Never  Vaping Use   Vaping status: Never Used  Substance and Sexual Activity   Alcohol use: No   Drug use: No   Sexual activity: Not on file    Comment: MARRIED  Other Topics Concern   Not on file  Social History Narrative   Not on file   Social Drivers of Health   Financial Resource Strain: Not on file  Food Insecurity: Not on file  Transportation Needs: Not on file  Physical Activity: Not on file  Stress: Not on file  Social Connections: Not on file     Family History: The patient's family history includes Alzheimer's disease in her father; Heart attack in her mother; Hypertension in her father; Other (age of onset: 59) in her sister; Stroke in her father.  ROS:   Please see the history of present illness.    Creased memory.  Appetite is stable.  All other systems reviewed and are negative.  EKGs/Labs/Other Studies Reviewed:    The following studies were reviewed today:  CARDIAC CATH 3 2023: Diagnostic Dominance: Co-dominant  EKG:  EKG Sinus bradycardia 56 bpm  Occasional PAC    Recent Labs: No results found for requested labs within last 365 days.  Recent Lipid Panel    Component Value Date/Time   CHOL 118 07/21/2021 0910   CHOL 116 02/09/2019 1044   TRIG 39 07/21/2021 0910   HDL 55 07/21/2021 0910   HDL 56 02/09/2019 1044   CHOLHDL 2.1  07/21/2021 0910   VLDL 8 07/21/2021 0910   LDLCALC 55 07/21/2021 0910   LDLCALC 46 02/09/2019 1044    Physical Exam:    VS:  BP (!) 110/43   Pulse (!) 56   Ht 5' 2 (1.575 m)   Wt 131 lb (59.4 kg)   SpO2 98%   BMI 23.96 kg/m     Wt Readings from Last 3 Encounters:  01/27/24 131 lb (59.4 kg)  12/06/22 130 lb 12.8 oz (59.3 kg)  02/26/22 131 lb 12.8 oz (59.8 kg)     GEN: Pt is in NAD   NECK: No JVD.  No bruits  CARDIAC:RRR  No murmurs  RESPIRATORY:  Clear to auscultation without rales, ABDOMEN: Soft, non-tender, no masses  EXT  No LE edema  2+ PT  ASSESSMENT:    1 CAD   Pt with intervention in 2018   Cath in 2019 no flow limiting dz Doing well  No symptoms of angina     2 Hx HTN BP is well controlled    3  Hx PAF   Pt denies palpitations  Remains on Eliquis    4  Hx HL    In June 2025 LDLD 41, HDL 54  Trig 113    5  Hx bradycardia  MOnitor in past Average HR over 60   PT denies dizziness  6  MEtabolics   A1C 5.9    Make sure getting adequate protein, veggies      Stay active      Follow up in 10 months    Medication Adjustments/Labs and Tests Ordered: Current medicines are reviewed at length with the patient today.  Concerns regarding medicines are outlined above.  Orders Placed This Encounter  Procedures   EKG 12-Lead   No orders of the defined types were placed in this encounter.   There are no Patient Instructions on file for this visit.   Signed, Vina Gull, MD  01/27/2024 11:17 AM    [facility]

## 2024-02-14 ENCOUNTER — Telehealth: Payer: Self-pay | Admitting: Pharmacy Technician

## 2024-02-14 ENCOUNTER — Other Ambulatory Visit (HOSPITAL_COMMUNITY): Payer: Self-pay

## 2024-02-14 ENCOUNTER — Telehealth: Payer: Self-pay | Admitting: Internal Medicine

## 2024-02-14 NOTE — Telephone Encounter (Signed)
 Per pt calls  PAP: Patient assistance application for Eliquis  through [employer] has been mailed to pt's home address on file.  Patient does not have insurance  I called the daughter to let her know the application will be in the mail

## 2024-02-14 NOTE — Telephone Encounter (Signed)
 Left message for patient's daughter to call back.

## 2024-02-14 NOTE — Telephone Encounter (Signed)
 Pt c/o medication issue:  1. Name of Medication:   apixaban  (ELIQUIS ) 2.5 MG TABS tablet   2. How are you currently taking this medication (dosage and times per day)?   As prescribed  3. Are you having a reaction (difficulty breathing--STAT)?   4. What is your medication issue?   Daughter Wilbur) stated patient's husband passed away and she no longer has coverage for this medication.  Daughter wants to get alternate medication of assistance getting this medication.  Daughter noted patient has 2 weeks left of this medication.

## 2024-02-18 ENCOUNTER — Telehealth: Payer: Self-pay | Admitting: Internal Medicine

## 2024-02-18 NOTE — Telephone Encounter (Signed)
 Medication name/dosage: Samples List: Eliquis  2.5 mg  Administration instructions: Take one tablet by mouth twice daily  Reason for samples: Reason for samples: unable to afford medication  Ordering provider: Dr. Okey  *Once above information entered, route the phone encounter to CV DIV MAG ST SAMPLES and send Teams message to team member assigned to Samples for the day.

## 2024-02-18 NOTE — Telephone Encounter (Signed)
 Pt c/o medication issue:  1. Name of Medication:   apixaban (ELIQUIS) 2.5 MG TABS tablet    2. How are you currently taking this medication (dosage and times per day)?    3. Are you having a reaction (difficulty breathing--STAT)? no  4. What is your medication issue? Calling to see if our office have any samples. Please advise

## 2024-02-18 NOTE — Telephone Encounter (Signed)
 Pt daughter asked that someone call her back about samples due to pt having dementia.

## 2024-02-19 MED ORDER — APIXABAN 2.5 MG PO TABS: 2.5000 mg | ORAL_TABLET | Freq: Two times a day (BID) | ORAL | Status: DC

## 2024-02-19 NOTE — Telephone Encounter (Signed)
 Called daughter (DPR) back about message. Informed her that we have samples and will leave them a the pharmacy check in on the 5th floor of the [employer]. Patient has not received application yet for patient assistance. They will get that filled out as soon as they get it and will return to our office.

## 2024-02-21 NOTE — Telephone Encounter (Addendum)
 She said the application was mailed Wednesday per daughter. Still have not received yet. They are working on getting her insurance. She said she is on her way to [facility] office and has a copy of the application and will drop it off

## 2024-02-24 NOTE — Telephone Encounter (Signed)
 I called the daughter and she said they did not drop off the application because they feel like she makes too much money. They are going tomorrow to try to get her insurance. She said she will let me know if they would like to proceed with the assistance

## 2024-04-14 ENCOUNTER — Other Ambulatory Visit: Payer: Self-pay | Admitting: Internal Medicine

## 2024-04-14 DIAGNOSIS — I480 Paroxysmal atrial fibrillation: Secondary | ICD-10-CM

## 2024-04-14 MED ORDER — ISOSORBIDE MONONITRATE ER 60 MG PO TB24: ORAL_TABLET | ORAL | 2 refills | Status: DC

## 2024-04-14 MED ORDER — APIXABAN 2.5 MG PO TABS
2.5000 mg | ORAL_TABLET | Freq: Two times a day (BID) | ORAL | 4 refills | Status: DC
Start: 2024-04-14 — End: 2024-04-23

## 2024-04-14 NOTE — Telephone Encounter (Signed)
 Prescription refill request for Eliquis  received. Indication:  A-Fib Last office visit:   01/27/2024 Scr: 1.36 care everywhere 12/04/23 Age: 89 yrs. Weight:   59.4 k

## 2024-04-14 NOTE — Telephone Encounter (Signed)
*  STAT* If patient is at the pharmacy, call can be transferred to refill team.   1. Which medications need to be refilled? (please list name of each medication and dose if known)   isosorbide  mononitrate (IMDUR ) 60 MG 24 hr tablet    apixaban  (ELIQUIS ) 2.5 MG TABS tablet     2. Would you like to learn more about the convenience, safety, & potential cost savings by using the [facility]? no   3. Are you open to using the [facility] (Type [facility].  ).no   4. Which pharmacy/location (including street and city if local pharmacy) is medication to be sent to?[facility] [address] [place] [address]   5. Do they need a 30 day or 90 day supply? 90 day    Pt is low on medication

## 2024-04-14 NOTE — Telephone Encounter (Signed)
 Prescription refill request for Eliquis  received. Indication:  A-Fib Last office visit:   01/27/2024 Scr: Age: 89 yrs. Weight:   59.4 kg   Pt does not have any recent labs. Please address

## 2024-04-23 ENCOUNTER — Telehealth: Payer: Self-pay | Admitting: Internal Medicine

## 2024-04-23 DIAGNOSIS — I480 Paroxysmal atrial fibrillation: Secondary | ICD-10-CM

## 2024-04-23 MED ORDER — APIXABAN 2.5 MG PO TABS: 2.5000 mg | ORAL_TABLET | Freq: Two times a day (BID) | ORAL | 4 refills | Status: AC

## 2024-04-23 MED ORDER — ISOSORBIDE MONONITRATE ER 60 MG PO TB24: ORAL_TABLET | ORAL | 2 refills | Status: AC

## 2024-04-23 NOTE — Telephone Encounter (Signed)
*  STAT* If patient is at the pharmacy, call can be transferred to refill team.   1. Which medications need to be refilled? (please list name of each medication and dose if known)   isosorbide  mononitrate (IMDUR ) 60 MG 24 hr tablet  apixaban  (ELIQUIS ) 2.5 MG TABS tablet   2. Would you like to learn more about the convenience, safety, & potential cost savings by using the [facility]?   3. Are you open to using the [facility] (Type [facility]. ).  4. Which pharmacy/location (including street and city if local pharmacy) is medication to be sent to?  [facility] [id number] - [place], [place] - [address]   5. Do they need a 30 day or 90 day supply?   Daughter Gerard) stated patient recently changed pharmacy due to insurance and will need prescriptions sent to [facility].  Daughter noted patient is out of these medications.

## 2024-04-23 NOTE — Telephone Encounter (Signed)
 Pt needs refills sent to a different pharmacy due to her Insurance.  Will send refills from 04/14/24 to pharmacy requested.     Prescription refill request for Eliquis  received. Indication:  A-Fib Last office visit:   01/27/2024 Scr: 1.36 care everywhere 12/04/23 Age: 89 yrs. Weight:   59.4 k

## 2024-05-22 ENCOUNTER — Other Ambulatory Visit: Payer: Self-pay | Admitting: Internal Medicine
